# Patient Record
Sex: Male | Born: 1935 | Race: White | Hispanic: No | Marital: Married | Smoking: Former smoker
Health system: Southern US, Community
[De-identification: ages and names within clinical notes are randomized; demographics above are authoritative.]

## PROBLEM LIST (undated history)

## (undated) DIAGNOSIS — N183 Chronic kidney disease, stage 3 unspecified: Secondary | ICD-10-CM

## (undated) DIAGNOSIS — I714 Abdominal aortic aneurysm, without rupture: Principal | ICD-10-CM

## (undated) DIAGNOSIS — K56609 Unspecified intestinal obstruction, unspecified as to partial versus complete obstruction: Secondary | ICD-10-CM

## (undated) DIAGNOSIS — E7800 Pure hypercholesterolemia, unspecified: Secondary | ICD-10-CM

## (undated) DIAGNOSIS — I2510 Atherosclerotic heart disease of native coronary artery without angina pectoris: Secondary | ICD-10-CM

## (undated) DIAGNOSIS — I219 Acute myocardial infarction, unspecified: Secondary | ICD-10-CM

## (undated) DIAGNOSIS — I509 Heart failure, unspecified: Secondary | ICD-10-CM

## (undated) DIAGNOSIS — I10 Essential (primary) hypertension: Secondary | ICD-10-CM

## (undated) HISTORY — DX: Pure hypercholesterolemia, unspecified: E78.00

## (undated) HISTORY — DX: Essential (primary) hypertension: I10

## (undated) HISTORY — PX: HERNIA REPAIR: SHX51

## (undated) HISTORY — DX: Abdominal aortic aneurysm, without rupture: I71.4

## (undated) HISTORY — DX: Heart failure, unspecified: I50.9

## (undated) HISTORY — PX: NEPHRECTOMY: SHX65

## (undated) HISTORY — DX: Atherosclerotic heart disease of native coronary artery without angina pectoris: I25.10

## (undated) HISTORY — DX: Acute myocardial infarction, unspecified: I21.9

---

## 2001-02-22 ENCOUNTER — Ambulatory Visit (HOSPITAL_COMMUNITY): Admission: RE | Admit: 2001-02-22 | Discharge: 2001-02-22 | Payer: Self-pay | Admitting: Family Medicine

## 2001-02-22 ENCOUNTER — Encounter: Payer: Self-pay | Admitting: General Surgery

## 2001-02-22 ENCOUNTER — Emergency Department (HOSPITAL_COMMUNITY): Admission: EM | Admit: 2001-02-22 | Discharge: 2001-02-22 | Payer: Self-pay | Admitting: *Deleted

## 2001-02-22 ENCOUNTER — Encounter: Payer: Self-pay | Admitting: Family Medicine

## 2001-02-22 ENCOUNTER — Inpatient Hospital Stay (HOSPITAL_COMMUNITY): Admission: RE | Admit: 2001-02-22 | Discharge: 2001-02-26 | Payer: Self-pay | Admitting: General Surgery

## 2001-02-22 DIAGNOSIS — K56609 Unspecified intestinal obstruction, unspecified as to partial versus complete obstruction: Secondary | ICD-10-CM

## 2001-02-22 HISTORY — DX: Unspecified intestinal obstruction, unspecified as to partial versus complete obstruction: K56.609

## 2001-02-23 ENCOUNTER — Encounter: Payer: Self-pay | Admitting: General Surgery

## 2001-02-24 ENCOUNTER — Encounter: Payer: Self-pay | Admitting: General Surgery

## 2001-04-12 ENCOUNTER — Encounter: Admission: RE | Admit: 2001-04-12 | Discharge: 2001-04-12 | Payer: Self-pay | Admitting: *Deleted

## 2001-04-12 ENCOUNTER — Encounter: Payer: Self-pay | Admitting: *Deleted

## 2001-04-24 DIAGNOSIS — I714 Abdominal aortic aneurysm, without rupture, unspecified: Secondary | ICD-10-CM

## 2001-04-24 DIAGNOSIS — I2510 Atherosclerotic heart disease of native coronary artery without angina pectoris: Secondary | ICD-10-CM

## 2001-04-24 HISTORY — DX: Abdominal aortic aneurysm, without rupture: I71.4

## 2001-04-24 HISTORY — DX: Abdominal aortic aneurysm, without rupture, unspecified: I71.40

## 2001-04-24 HISTORY — DX: Atherosclerotic heart disease of native coronary artery without angina pectoris: I25.10

## 2001-05-27 ENCOUNTER — Ambulatory Visit (HOSPITAL_COMMUNITY): Admission: RE | Admit: 2001-05-27 | Discharge: 2001-05-27 | Payer: Self-pay | Admitting: *Deleted

## 2001-05-27 ENCOUNTER — Encounter: Payer: Self-pay | Admitting: *Deleted

## 2001-06-04 ENCOUNTER — Ambulatory Visit (HOSPITAL_COMMUNITY): Admission: RE | Admit: 2001-06-04 | Discharge: 2001-06-04 | Payer: Self-pay | Admitting: *Deleted

## 2001-06-13 ENCOUNTER — Ambulatory Visit (HOSPITAL_COMMUNITY): Admission: RE | Admit: 2001-06-13 | Discharge: 2001-06-13 | Payer: Self-pay | Admitting: Cardiovascular Disease

## 2001-06-21 ENCOUNTER — Encounter: Payer: Self-pay | Admitting: Cardiothoracic Surgery

## 2001-06-22 HISTORY — PX: CORONARY ARTERY BYPASS GRAFT: SHX141

## 2001-06-25 ENCOUNTER — Inpatient Hospital Stay (HOSPITAL_COMMUNITY): Admission: RE | Admit: 2001-06-25 | Discharge: 2001-07-02 | Payer: Self-pay | Admitting: Cardiothoracic Surgery

## 2001-06-25 ENCOUNTER — Encounter: Payer: Self-pay | Admitting: Cardiothoracic Surgery

## 2001-06-26 ENCOUNTER — Encounter: Payer: Self-pay | Admitting: Cardiothoracic Surgery

## 2001-06-28 ENCOUNTER — Encounter: Payer: Self-pay | Admitting: Cardiothoracic Surgery

## 2001-06-29 ENCOUNTER — Encounter: Payer: Self-pay | Admitting: Cardiothoracic Surgery

## 2001-06-30 ENCOUNTER — Encounter: Payer: Self-pay | Admitting: Cardiothoracic Surgery

## 2001-07-01 ENCOUNTER — Encounter: Payer: Self-pay | Admitting: Cardiothoracic Surgery

## 2001-09-22 HISTORY — PX: ABDOMINAL AORTIC ANEURYSM REPAIR: SUR1152

## 2001-10-14 ENCOUNTER — Encounter: Payer: Self-pay | Admitting: *Deleted

## 2001-10-16 ENCOUNTER — Encounter: Payer: Self-pay | Admitting: *Deleted

## 2001-10-16 ENCOUNTER — Inpatient Hospital Stay (HOSPITAL_COMMUNITY): Admission: RE | Admit: 2001-10-16 | Discharge: 2001-10-26 | Payer: Self-pay | Admitting: *Deleted

## 2001-10-17 ENCOUNTER — Encounter: Payer: Self-pay | Admitting: *Deleted

## 2001-10-21 ENCOUNTER — Encounter: Payer: Self-pay | Admitting: *Deleted

## 2001-10-22 ENCOUNTER — Encounter: Payer: Self-pay | Admitting: *Deleted

## 2001-10-23 ENCOUNTER — Encounter: Payer: Self-pay | Admitting: *Deleted

## 2010-02-17 ENCOUNTER — Ambulatory Visit: Payer: Self-pay | Admitting: Cardiovascular Disease

## 2010-07-14 ENCOUNTER — Other Ambulatory Visit: Payer: Self-pay | Admitting: *Deleted

## 2010-07-14 DIAGNOSIS — I509 Heart failure, unspecified: Secondary | ICD-10-CM

## 2010-07-14 MED ORDER — LISINOPRIL 20 MG PO TABS: 20.0000 mg | ORAL_TABLET | Freq: Two times a day (BID) | ORAL | Status: DC

## 2010-07-14 NOTE — Telephone Encounter (Signed)
REFILL PER FAX FROM PHARMACY  

## 2010-08-12 ENCOUNTER — Other Ambulatory Visit: Payer: Self-pay | Admitting: *Deleted

## 2010-08-12 ENCOUNTER — Encounter: Payer: Self-pay | Admitting: Cardiovascular Disease

## 2010-08-12 DIAGNOSIS — E7800 Pure hypercholesterolemia, unspecified: Secondary | ICD-10-CM

## 2010-08-16 ENCOUNTER — Encounter: Payer: Self-pay | Admitting: Cardiovascular Disease

## 2010-08-16 ENCOUNTER — Other Ambulatory Visit (INDEPENDENT_AMBULATORY_CARE_PROVIDER_SITE_OTHER): Payer: PRIVATE HEALTH INSURANCE | Admitting: *Deleted

## 2010-08-16 ENCOUNTER — Ambulatory Visit (INDEPENDENT_AMBULATORY_CARE_PROVIDER_SITE_OTHER): Payer: PRIVATE HEALTH INSURANCE | Admitting: Cardiovascular Disease

## 2010-08-16 ENCOUNTER — Other Ambulatory Visit (INDEPENDENT_AMBULATORY_CARE_PROVIDER_SITE_OTHER): Payer: PRIVATE HEALTH INSURANCE | Admitting: Cardiovascular Disease

## 2010-08-16 VITALS — BP 158/88 | HR 68 | Ht 68.0 in | Wt 177.2 lb

## 2010-08-16 DIAGNOSIS — Z13220 Encounter for screening for lipoid disorders: Secondary | ICD-10-CM

## 2010-08-16 DIAGNOSIS — E785 Hyperlipidemia, unspecified: Secondary | ICD-10-CM

## 2010-08-16 DIAGNOSIS — E7800 Pure hypercholesterolemia, unspecified: Secondary | ICD-10-CM

## 2010-08-16 DIAGNOSIS — I2510 Atherosclerotic heart disease of native coronary artery without angina pectoris: Secondary | ICD-10-CM

## 2010-08-16 DIAGNOSIS — Z951 Presence of aortocoronary bypass graft: Secondary | ICD-10-CM | POA: Insufficient documentation

## 2010-08-16 LAB — LIPID PANEL
HDL: 36.8 mg/dL — ABNORMAL LOW (ref >39.00)
Triglycerides: 252 mg/dL — ABNORMAL HIGH (ref 0.0–149.0)
VLDL: 50.4 mg/dL — ABNORMAL HIGH (ref 0.0–40.0)

## 2010-08-16 LAB — LDL CHOLESTEROL, DIRECT: Direct LDL: 130.5 mg/dL

## 2010-08-16 LAB — BASIC METABOLIC PANEL
Calcium: 9.9 mg/dL (ref 8.4–10.5)
Creatinine, Ser: 1.5 mg/dL (ref 0.4–1.5)
GFR: 47.11 mL/min — ABNORMAL LOW (ref >60.00)
Sodium: 140 mEq/L (ref 135–145)

## 2010-08-16 LAB — HEPATIC FUNCTION PANEL
Albumin: 4.2 g/dL (ref 3.5–5.2)
Alkaline Phosphatase: 61 U/L (ref 39–117)
Bilirubin, Direct: 0.1 mg/dL (ref 0.0–0.3)
Total Protein: 7.7 g/dL (ref 6.0–8.3)

## 2010-08-16 NOTE — Assessment & Plan Note (Signed)
He is very stable from a coronary artery standpoint. He's not having any episodes of angina. His bypass grafting was 9 years ago. I like to continue with his same medications. I've given him some samples of Crestor 5 mg to try. He has been intolerant to other statins in the past.

## 2010-08-16 NOTE — Assessment & Plan Note (Signed)
We've drawn a fasting lipid profile today. I've given him some Crestor 5 mg tablets to try. He'll let me know if he is able to take them. I've strongly encouraged him to try to find a statin at we'll lower his cholesterol so that we can reduce his risk of having further coronary artery disease.

## 2010-08-16 NOTE — Progress Notes (Signed)
Elijah Saunders Date of Birth  12/28/1933 [facility] / [facility] [address]     [address] [place], [place]  [address] [phone]  Fax  [phone]  History of Present Illness:  Follow up for CAD// CABG.  No chest pain or dyspnea.  Elijah Saunders has done quite well. He's not had any episodes of chest pain or shortness of breath. He's been working out in his garden without any difficulties. He has had some vertigo recently but has not had any episodes of syncope.  He has tried several statin medications but has not been successful in finding one that works.  His last LDL readings have been a little bit high.  He complains of some diarrhea following some of his medications. He thinks that it might be beneficial capsule.   Current Outpatient Prescriptions on File Prior to Visit  Medication Sig Dispense Refill  . aspirin 325 MG EC tablet Take 325 mg by mouth daily.        . cyanocobalamin 500 MCG tablet Take 500 mcg by mouth daily.        . fish oil-omega-3 fatty acids 1000 MG capsule Take 2 g by mouth 2 (two) times daily.       Marland Kitchen glucosamine-chondroitin 500-400 MG tablet Take 1 tablet by mouth 2 (two) times daily.        Marland Kitchen lisinopril (PRINIVIL,ZESTRIL) 20 MG tablet Take 1 tablet (20 mg total) by mouth 2 (two) times daily.  60 tablet  11  . metFORMIN (GLUCOPHAGE) 500 MG tablet Take 500 mg by mouth daily with breakfast.       . Vitamin D, Ergocalciferol, (DRISDOL) 50000 UNITS CAPS Take 50,000 Units by mouth every 7 (seven) days.        Marland Kitchen CINNAMON PO Take by mouth daily.          Allergies  Allergen Reactions  . Lopid (Gemfibrozil)   . Penicillins     Past Medical History  Diagnosis Date  . CAD (coronary artery disease)     s/p CABG  . MI (myocardial infarction)   . CHF (congestive heart failure)   . Renal insufficiency   . Hypercholesteremia   . HTN (hypertension)     Past Surgical History  Procedure Date  . Nephrectomy     History  Smoking status   . Former Smoker  . Quit date: 04/25/1995  Smokeless tobacco  . Not on file    History  Alcohol Use: Not on file    No family history on file.  Reviw of Systems:  Reviewed in the HPI.  All other systems are negative.  Physical Exam: BP 158/88  Pulse 68  Ht 5\' 8"  (1.727 m)  Wt 177 lb 3.2 oz (80.377 kg)  BMI 26.94 kg/m2 The patient is alert and oriented x 3.  The mood and affect are normal.  The skin is warm and dry.  Color is normal.  The HEENT exam reveals that the sclera are nonicteric.  The mucous membranes are moist.  The carotids are 2+ without bruits.  There is no thyromegaly.  There is no JVD.  The lungs are clear.  The chest wall is non tender.  The heart exam reveals a regular rate with a normal S1 and S2.  There are no murmurs, gallops, or rubs.  The PMI is not displaced.   Abdominal exam reveals good bowel sounds.  There is no guarding or rebound.  There is no hepatosplenomegaly or tenderness.  There are no masses.  Exam of the legs reveal no clubbing, cyanosis, or edema.  The legs are without rashes.  The distal pulses are intact.  Cranial nerves II - XII are intact.  Motor and sensory functions are intact.  The gait is normal.  Assessment / Plan:

## 2010-08-18 ENCOUNTER — Telehealth: Payer: Self-pay | Admitting: *Deleted

## 2010-08-18 NOTE — Telephone Encounter (Signed)
Patient called with lab results. Pt verbalized understanding. Jodette Elvis Boot RN  

## 2010-08-18 NOTE — Telephone Encounter (Signed)
Message copied by Mahalia Longest on Thu Aug 18, 2010  4:53 PM ------      Message from: [place], [place]      Created: Thu Aug 18, 2010  1:05 PM       Trigs are high. Continue meds

## 2010-09-09 NOTE — Procedures (Signed)
[place]. [facility]  Patient:    Elijah Saunders, Elijah Saunders Visit Number: [id number] MRN: [id number]          Service Type: DSU Location: [facility] 2856 01 Attending Physician:  Caralee Ates Dictated by:   Caralee Ates, M.D. Proc. Date: 06/04/01 Admit Date:  06/04/2001 Discharge Date: 06/04/2001   CC:         CVTS Office             Peripheral Vascular Laboratory                           Procedure Report  PREOPERATIVE DIAGNOSIS:  Abdominal aortic aneurysm.  POSTOPERATIVE DIAGNOSIS:  Abdominal aortic aneurysm.  PROCEDURE:  Abdominal aortogram.  ANESTHESIA:  Fentanyl 25 mcg, Versed 0.5 mcg, and 1% lidocaine as local.  ACCESS:  Right common femoral (5-French sheath).  TOTAL CONTRAST:  125 cc of Isopaque.  BRIEF HISTORY:  This is a 77 year old gentleman who presented initially with a small bowel obstruction treated by the general surgeons and on CT scan during its evaluation was found to have a 6-cm infrarenal abdominal aortic aneurysm. The patient had no previous knowledge of the aneurysms existence.  He had had no symptoms related to it.  Subsequently, following his discharge from the hospital, he followed up with me for its evaluation and definitive repair.  He underwent arteriogram today to determine if he was in fact a candidate for an endovascular stent graft.  DESCRIPTION OF PROCEDURE:  The patient was brought to the catheterization lab and placed in the catheterization lab in supine position.  Following IV sedation, the groins were draped and prepped in sterile fashion.  The right femoral head was identified under fluoroscopy and the overlying skin was anesthetized with 1% lidocaine.  A small nick incision was made.  A blunt track was dissected down to the level of the common femoral artery.  The common femoral artery was then percutaneously punctured and a 5-French sheath was placed over an 035 guidewire.  Next, a graduated pigtail catheter  was advanced on the guidewire up into the suprarenal aorta.  Serial contrast injections were made in the AP and lateral positions until satisfactory images of the aneurysms anatomy had been obtained.  Once all required images had been obtained, the catheter was draped over a guidewire and removed.  The patient was transferred to the recovery area, where the groin sheath was removed and direct pressure was held.  The patient tolerated the procedure well.  There were no complications.  ANGIOGRAM FINDINGS:  Infrarenal abdominal aortic aneurysm that appears to originate approximately 1.5 cm below the renal arteries.  The aneurysm extends to the bifurcation; however, it returns to a fairly normal taper at the bifurcation.  There does not appear to be aneurysm involvement of the iliac arteries bilaterally.  There is moderate proximal left common iliac artery stenosis present.  This does not appear to be hemodynamically significant. Both epigastric arteries were widely patent and filled normal collateral branches into the pelvis.  On the bottom of the oblique groin to the pelvis, the common femoral arteries were both widely patent with no evidence of disease.  RECOMMENDATIONS: 1. Routine post catheterization care. 2. I do not believe he is a candidate for an endovascular stent graft repair    due to the size of the proximal aortic neck, as it appears to be somewhat    dilated.  His options are to undergo  an open aortic aneurysm repair versus    being enrolled in a research protocol with one of the endovascular devices    with a larger caliber proximal neck, such as the Medtronic Talent device.    I plan to discuss his options with him in detail and make a plan from    there. Dictated by:   Caralee Ates, M.D. Attending Physician:  Caralee Ates. DD:  06/06/01 TD:  06/06/01 Job: [id number]

## 2010-09-09 NOTE — Cardiovascular Report (Signed)
[place]. [facility]  Patient:    Elijah Saunders, Elijah Saunders Visit Number: [id number] MRN: [id number]          Service Type: DSU Location: [facility] 2856 01 Attending Physician:  Caralee Ates Dictated by:   [name], M.D. Proc. Date: 06/13/01 Admit Date:  06/04/2001 Discharge Date: 06/04/2001   CC:         Maricela Bo, M.D.  Caralee Ates, M.D.   Cardiac Catheterization  INDICATIONS: The patient is a 77 year old gentleman with a history of an old myocardial infarction. He was recently found to have a large abdominal aortic aneurysm and was sent to our office for preoperative evaluation prior to his aneurysm surgery. A Cardiolite study revealed the presence of an old inferior wall myocardial infarction as well as a markedly reduced left ventricular systolic function with an EF of 28%. The patient presented for followup and discussion of this. His wife brought pictures of his previous heart catheterization about 10 years ago. It was found at that time that he actually had an old anterior wall myocardial infarction with collateral filling of the LAD via the right coronary artery. He was scheduled for a heart catheterization based on the new results that he most likely had an inferior wall myocardial infarction as well.  His baseline laboratory prior to the heart catheterization revealed a creatinine of 1.8. His glucose is 122. His BUN is 28. His triglyceride level is 474, cholesterol is 172.  PROCEDURES: Left heart catheterization with coronary angiography.  The right femoral artery was easily cannulated using a modified Seldinger technique. We were able to pass through the aneurysm without any difficulty and the catheters were all exchanged over a long exchange wire.  HEMODYNAMICS: The left ventricular pressure is 132/11 with an aortic pressure of 134/72.  ANGIOGRAPHY: The left main coronary artery has moderate disease. It is very heavily  calcified. There are no discrete stenosis but there is a 30-40% stenosis in the distal aspect of the left main.  The left anterior descending artery has diffuse disease throughout. There are diffuse 30-40% stenosis in the proximal segment. The LAD gives off a relatively large first diagonal branch and then several small septal branches and is then occluded. The mid and distal LAD can be seen filling via faint collaterals from the left system and can be seen filling via the right system during the RCA injections.  The first diagonal vessel is a moderate to large vessel. There are minor luminal irregularities. The first diagonal vessel does have a moderate degree of calcification.  The left circumflex artery is a moderate to large vessel. There is a 50-60% stenosis at the origin. There are mild to moderate diffuse irregularities throughout the remainder of the left circumflex artery.  The right coronary artery is a relatively large vessel. It is occluded after giving off a large right ventricular marginal branch. The distal right coronary artery can been seen filling very faintly from the right injections but can be seen filling fairly well from collaterals from the left system. The conus branch provides a large collateral branch to the LAD, and the LAD can be seen filling very well from injections from the right coronary artery.  LEFT VENTRICULOGRAM: The left ventriculogram was performed in a 30 RAO position. It reveals a moderately dilated left ventricle with akinesis of the inferior base and of the mid anterior wall. The remainder of the mid and distal anterior wall seems to contract fairly normally, as  well as the apex. The aortic root appears to be mildly dilated. There was no aortic stenosis. The ejection fraction is approximately 35%, which is somewhat improved from the EF by Cardiolite scanning last week.  COMPLICATIONS: None.  CONCLUSIONS: 1. Significant coronary artery  disease involving the left anterior descending    artery and right coronary artery. He has moderate disease involving the    diagonal branch and left circumflex artery. 2. He has a moderately reduced left ventricular systolic function with    segmental wall motion abnormalities consistent with previous myocardial    infarctions. His mid and distal anterior wall seem to contract fairly    well and there is evidence of anterior and anteroapical viability by    Cardiolite scanning. There is a definite scar in the inferior base and    mid inferior wall. The left ventricle is moderately dilated. 3. Question of a moderately dilated aortic root. There is no evidence of    aortic stenosis. An aortogram was not performed because of his renal    dysfunction and the fact that he has only one kidney. We will need to    obtain an echocardiogram for further assessment of his aortic valve.  The patient will likely need to have coronary artery bypass grafting for revascularization prior to his abdominal aortic aneurysm surgery. We will discuss all of these issues with Dr. Elyn Peers and the cardiovascular surgeons.  He is at somewhat high risk for both of these surgeries given his tendency toward diabetes mellitus and renal dysfunction. Dictated by:   [name], M.D. Attending Physician:  Caralee Ates. DD:  06/13/01 TD:  06/13/01 Job: [id number]
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
## 2010-09-09 NOTE — H&P (Signed)
[place]. [facility]  Patient:    Elijah Saunders, Elijah Saunders Visit Number: [id number] MRN: [id number]          Service Type: SUR Location: [facility] 2550 10 Attending Physician:  Caralee Ates Dictated by:   Dominica Severin, P.A. Admit Date:  10/16/2001                           History and Physical  DATE OF BIRTH:  1933/06/12  CHIEF COMPLAINT:  Abdominal aortic aneurysm.  HISTORY OF PRESENT ILLNESS:  This is a 77 year old Caucasian male who was referred by Dr. Derrell Lolling for evaluation of an abdominal aortic aneurysm.  It was initially found on a routine workup for a GI problem back in December.  It was found to be 5 cm at the time and the patient was not symptomatic.  He has been seen in follow-up after cardiac workup.  At the time of his cardiac workup it was determined that he needed bypass surgery.  He underwent bypass surgery in March 2003 and now, after he has recovered from that, he is cleared for surgery to repair his abdominal aortic aneurysm.  The patient denies any abdominal pain, nausea, or vomiting.  He does have occasional constipation with some hemorrhoids.  Denies any hematochezia, back pain, hematemesis.  He has some claudication symptoms in his calves after approximately 10 minutes; it goes away with rest.  He denies any peripheral edema, dysuria, hematuria. He does have some heartburn symptoms.  He denies any shortness of breath or dyspnea on exertion.  PAST MEDICAL HISTORY: 1. Hypertension. 2. Nephrolithiasis. 3. Coronary artery disease status post myocardial infarction with an ejection    fraction of 28%. 4. Vertigo. 5. Hyperlipidemia. 6. Status post recent small bowel obstruction.  PAST SURGICAL HISTORY: 1. CABG x4 done in March 2003. 2. 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 3. Bladder stone extraction. 4. Appendectomy. 5. Left inguinal herniorrhaphy.  MEDICATIONS: 1. Aspirin 325 mg daily. 2. Lasix  40 mg daily. 3. Potassium chloride 20 mEq daily. 4. Atenolol 25 mg daily. 5. Lisinopril 10 mg daily.  ALLERGIES:  PENICILLIN, which causes a rash.  REVIEW OF SYSTEMS:  Please see history of present illness for significant positives.  Otherwise, the patient denies any diabetes.  He does have a history of kidney disease.  Denies any history of stroke or TIAs, amaurosis fugax.  He does have a history of cardiac disease.  FAMILY HISTORY:  The patients mother is deceased at 65.  The patients father is deceased at 76 from COPD.  They both had a history of coronary artery disease, diabetes, and hypertension.  SOCIAL HISTORY:  The patient is married for 43 years.  He has two children. He works in Hydrologist.  He denies any alcohol use.  He quit smoking five years ago.  He has a 44 pack-year history.  PHYSICAL EXAMINATION:  VITAL SIGNS:  Blood pressure 120/68, pulse 76, respirations 18.  GENERAL:  This is a 77 year old Caucasian male who is in no acute distress who is alert and oriented x3.  HEENT:  Head is normocephalic, atraumatic.  Eyes are PERRLA/EOMI without cataracts, glaucoma, or macular degeneration.  NECK:  Supple without JVD, bruits, or lymphadenopathy.  CHEST:  Symmetrical on inspiration.  LUNGS:  Without wheezes, rhonchi, rales, or lymphadenopathy.  HEART:  Regular rate and rhythm without murmurs, gallops, or rubs. Demonstrates a well-healed sternotomy.  ABDOMEN:  Soft,  nontender, with positive bowel sounds x4 quadrants without any masses or bruits.  He has a well-healed nephrectomy site on the left as well as a right incision from previous bladder surgery and well-healed left inguinal herniorrhaphy incision.  GENITOURINARY AND RECTAL:  Deferred.  EXTREMITIES:  Without clubbing, cyanosis, edema, or ulcerations.  Saphenous vein harvest site of the left lower extremity is well healed.  Temperature is warm.  PERIPHERAL PULSES:  He has 2+ peripheral pulses  bilaterally on his carotid, femoral, popliteal, and pedal pulses.  NEUROLOGIC:  There are no focal deficits.  He has a steady gait.  He has 2+ deep tendon reflexes and 3+ muscle strength bilaterally.  ASSESSMENT AND PLAN:  Abdominal aortic aneurysm.  He is to undergo revision and grafting on October 16, 2001 by Dr. Elyn Peers.  Dr. Elyn Peers has seen and evaluated this patient prior to this admission and has explained the risks and benefits of the procedure and the patient has agreed to continue. Dictated by:   Dominica Severin, P.A. Attending Physician:  Caralee Ates. DD:  10/14/01 TD:  10/15/01 Job: [id number]

## 2010-09-09 NOTE — Discharge Summary (Signed)
[name]. [facility]  Patient:    Elijah Saunders, SOLTYS Visit Number: [id number] MRN: [id number]          Service Type: SUR Location: 2000 2021 01 Attending Physician:  Waldo Laine Dictated by:   Coral Ceo, P.A.C. Admit Date:  06/25/2001 Discharge Date: 07/02/2001   CC:         CVTS Office  Maricela Bo, M.D.  [name], M.D.  Angelia Mould. Derrell Lolling, M.D.   Discharge Summary  ADMISSION DIAGNOSES: 1. Coronary artery disease. 2. 5.0 cm abdominal aortic aneurysm. 3. History of myocardial infarction. 4. History of recent partial small bowel obstruction. 5. Status post left nephrectomy. 6. Hypertension. 7. History of nephrolithiasis. 8. Postoperative tracheobronchitis.  PROCEDURE:  Coronary artery bypass grafting x4 (left internal mammary artery to the LAD, saphenuos vein graft to the distal right coronary artery, saphenuos vein graft to the circumflex coronary artery, saphenuos vein graft to the diagonal).  HISTORY OF PRESENT ILLNESS:  The patient is a 77 year old male who was recently hospitalized in December of 2002 and treated by Angelia Mould. Derrell Lolling, M.D. for partial small bowel obstruction which required NG suctioning.  During his hospital stay as part of his evaluation, a CT scan was performed of his abdomen which showed a 5.5 cm abdominal aortic aneurysm.  He did recover from his bowel obstruction and was discharged home.  Following his admission he was seen in the CVTS Office by Brayton Layman, M.D. and it was recommended that he proceed with repair of his abdominal aortic aneurysm.  In light of his history of previous myocardial infarction, it was recommended that he see Dr. [name] for preoperative clearance.  A Cardiolite stress test showed markedly reduced left ventricular systolic function with an ejection fraction of approximately 28%.  He underwent cardiac catheterization on June 13, 2001, and was found to have a 30  to 40% distal left main stenosis along with multivessel coronary artery disease.  It was recommended that at this time he proceed with surgical revascularization for his cardiac lesions prior to surgical repair of his abdominal aortic aneurysm.  He was seen in consultation by Dr. Tyrone Sage and was admitted on June 25, 2001.  HOSPITAL COURSE:  He was taken to the operating room on the day of admission and underwent CABG x4.  He tolerated the procedure well and was transferred to the ICU in stable condition.  He was extubated shortly after surgery. He was hemodynamically stable and ready for transfer to the floor on postoperative day #1.  Postoperatively, he has done well.  He did develop a cough with purulent sputum and mild low grade fever.  He was started on Cipro empirically for tracheobronchitis.  Since that time he has done well.  He has been weaned off supplemental oxygen and at present, is maintaining O2 saturations of 92 to 94% on room air.  His incisions are healing well.  He has remained afebrile and vital signs have been stable.  His incisions are healing well.  His pulmonary status is improved at present.  He is ambulating in the halls without difficulty and is tolerating a regular diet.  He is having normal bowel and bladder function.  It is felt that if he continues to remain stable, he will be ready for discharge home on July 02, 2001.  DISCHARGE MEDICATIONS: 1. Enteric-coated aspirin 325 mg q.d. 2. Folic acid 1 mg q.d. 3. Reserpine 0.1 mg 1/2 tablet q.d. 4. Cipro 500 mg q.8h. x7  days. 5. Gemfibrozil 600 mg b.i.d. 6. Lasix 40 mg q.d. 7. K-Dur 20 mEq q.d. 8. Humibid LA 600 mg b.i.d. x7 days. 9. Darvocet-N 100 one to two q.4h. p.r.n. for pain.  DISCHARGE INSTRUCTIONS:  He is to refrain from driving, heavy lifting, or strenuous activity.  He should continue daily walking and incentive spirometry exercises.  He is asked to continue low fat, low sodium diet.  He may  shower daily and clean his incisions with soap and water.  FOLLOW-UP:  He will see Dr. [name] in two weeks and have a chest x-ray performed at that time.  He has been asked to follow up with Dr. Tyrone Sage in three weeks and should bring his chest x-ray to this appointment. Dictated by:   Coral Ceo, P.A.C. Attending Physician:  Waldo Laine DD:  07/01/01 TD:  07/03/01 Job: [id number]

## 2010-09-09 NOTE — Op Note (Signed)
[place]. [facility]  Patient:    Elijah Saunders, Elijah Saunders Visit Number: [id number] MRN: [id number]          Service Type: SUR Location: 3300 3304 01 Attending Physician:  Caralee Ates Dictated by:   Caralee Ates, M.D. Proc. Date: 10/16/01 Admit Date:  10/16/2001                             Operative Report  PREOPERATIVE DIAGNOSIS:  Infrarenal abdominal aortic aneurysm.  POSTOPERATIVE DIAGNOSIS:  Infrarenal abdominal aortic aneurysm.  OPERATION PERFORMED:  Abdominal aortic aneurysm repair (#18 Dacron tube graft).  SURGEON:  Caralee Ates, M.D.  ASSISTANT: 1. Larina Earthly, M.D. 2. Claudette Krell, RNFA  ANESTHESIA:  General endotracheal.  ESTIMATED BLOOD LOSS:  1300 cc.  DRAINS:  None.  SPECIMENS:  None.  COMPLICATIONS:  None.  INDICATIONS FOR PROCEDURE:  The patient is a 77 year old white gentleman who was discovered to have a 6 cm asymptomatic infrarenal abdominal aortic aneurysm six months ago and was being worked up for that and during his course of cardiac risk stratification was found to have severe coronary disease which required coronary artery bypass grafting in March by Dr. Tyrone Sage.  Since that time he has done quite well and today is felt to be a good candidate for open aneurysm repair.  He was not a candidate for an intravascular stent graft due to the large diameter of his infrarenal aortic neck.  DESCRIPTION OF PROCEDURE:  The patient was brought to the operating room and placed on the operating table in supine position.  Following adequate general endotracheal anesthesia, the chest, abdomen and upper thighs were prepped and draped in sterile fashion.  A vertical midline incision was made.  The wound was deepened  using a Bovie to control bleeding down to the level of the fascia.  The fascia was opened in the midline.  There were dense adhesions to the anterior abdominal wall from two previous abdominal operations and a great deal  of meticulous dissection was required to safely remove these from the anterior abdominal fascia.  This was done in sharp fashion and no bowel injury was incurred.  Once these adhesions had been freed, the fascia was opened for the length of the wound and the peritoneal cavity was entered.  There were dense adhesions from the small bowel to the transverse colon and these were taken down sharply.  Once all the adhesions had been lysed, the abdominal cavity was inspected for abnormality and there was no evidence of neoplastic or inflammatory disease.  The colon was then then reflected cephalad and the small bowel eviscerated to the right.  The retroperitoneum was then opened over the aneurysm up to the level of the renal vein.  The aortic neck was then dissected free on both sides and slotted in preparation for placement of cross clamp.  Next, the retroperitoneum was opened inferiorly beyond the bifurcation of the aorta and the iliac arteries on the other side were exposed.  Each of these was mobilized and encircled with vessel loops for control.  There was dense calcific plaque on the posteromedial aspect of both iliacs which were not felt to be amenable to suture.  Next, the patient was systemically heparinized.  Following adequate three-minute circulation time, the iliac clamps were placed followed by proximal aortic clamp.  The aneurysm was opened vertically staying clear of the origin of the inferior mesenteric artery which was  pulsatile.  The aneurysm was then td off cephalad.  It should be noted that he had a single right renal artery due to the fact that he has had a left nephrectomy in the past and a clamp was placed well below the right renal artery.  The aortotomy was then extended inferiorly to just above the bifurcation where it was td off there as well.  There was pulsatile back-bleeding from the IMA and this was controlled with a Glover clamp.  Next, a #18 Dacron tube graft was  selected and sewn into position proximally in end-to-end fashion with running 3-0 Prolene suture.  Following completion of the anastomosis, the clamp that was placed on the graft below the anastomosis and proximally over the graft, and clamp was released.  There was one area of bleeding and this was controlled with a single 3-0 Prolene repair stitch. Once the anastomotic suture line was hemostatic, the graft was extended its appropriate length and trimmed.  The distal anastomosis was created in end-to-end fashion to just above the bifurcation of the aorta with a running 3-0 Prolene suture.  Prior to completion of this anastomosis, the iliacs were back-bled and the graft was flushed.  The anastomosis was then completed.  The proximal clamp was released with pressure held on the groins restoring flow to the pelvis.  Blood pressure tolerated this well.  The leg perfusion was then re-established individually as blood pressure would allow.  Ultimately, flow to both legs was re-established without difficulty.  There was excellent Doppler flow in both feet and palpable femoral pulses bilaterally.  Again, the IMA was evaluated and found to have pulsatile back-bleeding and there was no evidence of colon ischemia with the IMA clamped during placement of the aortic graft.  Therefore, the IMA was oversewn from the inside with 2-0 silk suture ligature.  Next, 60 mg of protamine was administered and hemostasis achieved. The aneurysm sac was then reapproximated over the aortic graft and then the retroperitoneum was reapproximated with 2-0 Vicryl suture.  Again the colon was inspected for evidence of ischemia and there was none seen.  The colon and small bowel were then returned to their appropriate positions in the abdominal cavity and the abdomen was closed with a running #1 looped PDS suture as well as interrupted 0 Ethibond suture.  The wound was then irrigated and closed with skin staples.  Sterile dry  dressings were applied and the patient was then awakened from anesthesia and transferred to the recovery room in stable  condition.  The patient tolerated the procedure well.  There were no complications.  All sponge and needle counts were reported as correct. Dictated by:   Caralee Ates, M.D. Attending Physician:  Caralee Ates. DD:  10/16/01 TD:  10/17/01 Job: [id number]

## 2010-09-09 NOTE — Discharge Summary (Signed)
[place]. [facility]  Patient:    SIRAJ, DERMODY Visit Number: [id number] MRN: [id number]          Service Type: SUR Location: 2000 2011 01 Attending Physician:  Caralee Ates. Dictated by:   Sherrie George, P.A. Admit Date:  10/16/2001 Discharge Date: 10/26/2001   CC:         [name], M.D.   Discharge Summary  DATE OF BIRTH: 1934/03/06  ADMISSION DIAGNOSES: 1. A 6 cm abdominal aortic aneurysm. Status post recent small bowel    obstruction. 2. Hypertension. 3. Coronary artery disease, status post myocardial infarction with an    EF of 28%. 4. Renal insufficiency with baseline creatinine of 2.0. 5. Hyperlipidemia.  DISCHARGE DIAGNOSES: 1. A 6 cm abdominal aortic aneurysm. Status post recent small bowel    obstruction. 2. Hypertension. 3. Coronary artery disease, status post myocardial infarction with an    EF of 28%. 4. Renal insufficiency with baseline creatinine of 2.0. 5. Hyperlipidemia. 6. Postoperative ileus.  PROCEDURES: Resection and grafting of abdominal aortic aneurysm and repair with #18 mm tube graft on October 16, 2001 by Dr. Elyn Peers.  HISTORY OF PRESENT ILLNESS: The patient is a 77 year old white male patient of Dr. [name], Montez Hageman. The patient was recently admitted by Dr. Claud Kelp for a small bowel obstruction and was found to have a 6 cm infrarenal abdominal aortic aneurysm on CT scan at that time. This has been asymptomatic and he has had no problems with this. He has undergone cardiac stratification and was found to have significant coronary artery occlusive disease. He underwent CABG times four by Dr. Tyrone Sage on June 25, 2001. Since his discharge from the hospital, he has done quite well and now returns for elective repair of his abdominal aortic aneurysm. He was found not to be a candidate for the endovascular grafting and he is admitted for elective open resection grafting of his abdominal aortic  aneurysm.  PAST MEDICAL HISTORY: 1. Small bowel obstruction as noted above. 2. History of hypertension. 3. Coronary artery disease status post MI with EF of 28% and CABG times four    in March of 2003. 4. History of vertigo. 5. Hyperlipidemia. 6. Status post left nephrectomy for a Staghorn calculus 48 years ago. 7. Appendectomy. 8. Inguinal hernia repair. 9. Bladder stone extraction.  ADMISSION MEDICATIONS: 1. Ecotrin 325 mg one q.d. 2. Lasix 40 mg q.d. 3. KCL 20 meq daily. 4. Atenolol 25 mg q.d. 5. Lisinopril 20 mg one half tab q.d.  ALLERGIES: PENICILLIN (rash).  PHYSICAL EXAMINATION: Please see dictated note.  SOCIAL HISTORY: The patient smoked one pack per day for 44 years. Denies alcohol use.  HOSPITAL COURSE: The patient was admitted and taken to the OR and underwent repair of abdominal aortic aneurysm with #18 mm tube graft. He tolerated the procedure well and returned to the recovery room and then the ICU in satisfactory condition. He made good progress for the first 48 hours but developed some nausea and vomiting on October 20, 2001. He developed persistent vomiting on October 20, 2001. NG was inserted. IV fluids were elevated. KUB showed an ileus. The fifth postoperative day, he had over 1300 cc, either vomitus or NG drainage. He was kept on low suction and was hydrated appropriately. On October 22, 2001, the sixth postoperative day, he was still putting out large volume of NG drainage at 1650 cc for the 24 hours prior and over 325 cc in the first  eight hours. His potassium was low and this was replaced. By the seventh postoperative day, his NG was diminished. It continued to diminish through the eighth and his NG was initially clamped off and then it was removed. He was started on clear liquids and then advanced to full liquids later in the day. He was started on a soft diet on October 25, 2001. If he tolerates this well and continues to do well, will be able to discharge him  home in the morning, October 26, 2001. The patient came in with a baseline creatinine of 2.0. This was monitored closely throughout his hospital course. His creatinine remained stable and was between 1.8 and 2.0 over the four days prior to discharge. Potassium low and this has been replaced. He is mobilized. He was on hand held nebulizers. This was switched to Combivent and has now been discontinued. If he does well and has no further wheezing or other problems and if he is able to continue eating without difficulty, will aim at discharging him home in the morning, October 26, 2001. He had a small bowel movement yesterday and today, October 25, 2001. His wounds are all healing nicely. It is all midline incision. Will remove the staples in the morning prior to discharge.  DISCHARGE MEDICATIONS: Preadmission medications as before, as noted above. This includes the following. 1. Ecotrin 325 mg q.d. 2. Lasix 40 mg q.d. 3. K-Dur 20 meq q.d. 4. Atenolol 25 mg q.d. 5. Lisinopril 20 mg one half tablet q.d. 6. Tylox one to two p.o. q.4h. p.r.n.  FOLLOW-UP: He will return to see Dr. Elyn Peers in two weeks and he is to contact Dr. [name] for follow-up with his office also.  LABORATORY DATA: H&H 11.1 and 32. White count 15.7. Platelets 385,000. Electrolytes normal except for potassium of 3.3, BUN 21, creatinine 1.9, and glucose of 123.  DISCHARGE CONDITION: Improving. Dictated by:   Sherrie George, P.A. Attending Physician:  Caralee Ates. DD:  10/25/01 TD:  10/29/01 Job: [id number]

## 2010-09-09 NOTE — Op Note (Signed)
[place]. [facility]  Patient:    Elijah Saunders, Elijah Saunders Visit Number: [id number] MRN: [id number]          Service Type: SUR Location: 2000 2021 01 Attending Physician:  Waldo Laine Dictated by:   Gwenith Daily Tyrone Sage, M.D. Proc. Date: 06/25/01 Admit Date:  06/25/2001   CC:         Elijah Grove., M.D.   Operative Report  PREOPERATIVE DIAGNOSIS:  Coronary occlusive disease.  POSTOPERATIVE DIAGNOSIS:  Coronary occlusive disease.  PROCEDURE:  Coronary artery bypass grafting x4 with the left internal mammary to the left anterior descending coronary artery, reversed saphenous vein graft to the diagonal coronary artery, reversed saphenous vein graft to the first obtuse marginal coronary artery, reversed saphenous vein graft to the distal right coronary artery.  SURGEON:  Gwenith Daily. Tyrone Sage, M.D.  FIRST ASSISTANT:  Tim Lair.  BRIEF HISTORY:  The patient is a 77 year old male who presented in December 2002 with a partial small bowel obstruction.  At that time he was evaluated by Dr. Claud Kelp.  His small bowel symptoms resolved but during his hospitalization, evaluation of his abdomen included a CT scan, which demonstrated an abdominal aortic aneurysm close to 6 cm in size.  He was evaluated by Dr. Elyn Peers for consideration of aneurysm repair.  During this evaluation because of the patients previous history of a myocardial infarction 10 years previously, a cardiology evaluation was carried out preoperatively, including echocardiogram and stress test by Dr. [name]. This led to cardiac catheterization, which demonstrated significant three-vessel coronary artery disease with total occlusion of the LAD, a large diagonal with a 60% stenosis, a circumflex system with disease feeding a large obtuse marginal, and total occlusion of the right coronary artery after the takeoff of the acute marginal and with decreased LV function with an  ejection fraction approximately 30%.  Coronary artery bypass grafting was recommended. The patient agreed and signed the informed consent.  DESCRIPTION OF PROCEDURE:  With Swan-Ganz and arterial line monitors in place, the patient underwent general endotracheal anesthesia without incident.  Skin of the chest and legs was prepped with Betadine and draped in the usual sterile manner.  Vein was harvested from the right lower extremity and was of good quality and caliber.  Median sternotomy was performed, and the left internal mammary artery was dissected down as a pedicle graft.  The distal artery was dilated and had good, free flow.  The pericardium was opened.  The patient had evidence of global hypokinesis.  He was systemically heparinized. The ascending aorta and the right atrium were cannulated and an aortic root vent cardioplegia needle was introduced into the ascending aorta.  The patient was placed on cardiopulmonary bypass 2.4 L/min. per sq. m.  Sites of anastomosis were selected and dissected out of the epicardium.  The patients LAD was partially intramyocardial but was located relatively easily.  His body temperature was cooled to 30 degrees, the aortic crossclamp was applied, and 500 cc of cold blood potassium cardioplegia was administered with rapid diastolic arrest of the heart.  Attention was turned to the first obtuse marginal, which was opened and admitted a 1.5 mm probe.  Using a running 7-0 Prolene, a distal anastomosis was performed.  Attention was then turned to the distal right coronary artery, where the vessel was thickened distally just past the takeoff of the posterior descending.  The right coronary artery was opened and admitted a 1.5 mm probe proximally and distally.  Using a running 7-0 Prolene, distal anastomosis was performed.  Attention was then turned to the diagonal coronary artery, which was a moderate-sized vessel and admitted a 1.5 mm probe.  Using a  running 7-0 Prolene, a segment of reversed saphenous vein graft was anastomosed to the diagonal coronary artery.  Attention was then turned to the left anterior descending coronary artery, which was a totally occluded vessel and had fill from collaterals from the right.  The vessel was opened and admitted a 1.5 mm probe proximally and distally.  Using a running 8-0 Prolene, the left internal mammary artery was anastomosed to the left anterior descending coronary artery.  With release of the Edwards bulldog on the mammary artery, there was appropriate rise in myocardial septal temperature.  Aortic crossclamp was removed.  Total crossclamp time was 54 minutes.  The patient spontaneously converted to a sinus rhythm.  A partial occlusion clamp was placed on the ascending aorta, and two punch aortotomies were performed and each of the two vein grafts anastomosed to the ascending aorta.  Air was evacuated from the grafts, and the partial occlusion clamp was removed.  Sites of anastomosis were inspected and were free of bleeding.  The patient was then ventilated and weaned from cardiopulmonary bypass without difficulty on low-dose dopamine.  He had been maintained on dopamine because of mild renal insufficiency and previously surgically removed left kidney. The patient was decannulated in the usual fashion.  Protamine sulfate was administered with the operative field hemostatic.  Two atrial and two ventricular pacing wires were applied, graft markers were applied.  A left pleural tube and two mediastinal tubes left in place.  The pericardium was loosely reapproximated, the sternum closed with #6 stainless steel wire, fascia closed with interrupted 0 Vicryl, running 3-0 Vicryl in the subcutaneous tissue and 4-0 subcuticular stitch in the skin edges.  Dry dressings were applied.  Sponge and needle count was reported as correct at the completion of the procedure.  The patient tolerated the procedure  without obvious complication and was transferred to the surgical intensive care unit for further postoperative care.  Dictated by:   Gwenith Daily Tyrone Sage, M.D. Attending Physician:  Waldo Laine DD:  06/28/01 TD:  06/30/01 Job: [id number]

## 2010-09-09 NOTE — Consult Note (Signed)
[place]  Patient:    Elijah Saunders, Elijah Saunders Visit Number: [id number] MRN: [id number]          Service Type: EMS Location: [facility] Attending Physician:  Carmelina Peal Dictated by:   Caralee Ates, M.D. Proc. Date: 02/22/01 Admit Date:  02/22/2001 Discharge Date: 02/22/2001                            Consultation Report  CONSULTING SERVICE:  Angelia Mould. Derrell Lolling, M.D.  REASON FOR CONSULTATION:  Abdominal aortic aneurysm.  HISTORY OF PRESENT ILLNESS:  This is a 77 year old white gentleman who was admitted through the emergency department by [facility]. Derrell Lolling, M.D. with a diagnosis of a partial small-bowel obstruction. According to Elijah Saunders, he has had abdominal pain with crampy distention for four days that worsened in the last 24 hours. He has had nausea, vomiting, multiple bouts of non-bloody diarrheal stools. Following his initial encounter in the emergency department, an abdominal ultrasound was obtained given the patients symptoms. They found a 5.2 cm abdominal aortic aneurysm. There is no evidence of periaortic fluid or leak. The patient states that his pain is primarily periumbilical and does not have radiation to his lower extremities or to his back. He has had multiple prior abdominal operations and has had a previous episode of partial small-bowel obstruction. He did not have prior knowledge of his abdominal aortic aneurysm.  ALLERGIES:  PENICILLIN ("swells up").  CURRENT MEDICATIONS:  Reserpine and aspirin.  PAST MEDICAL HISTORY:  Hypertension, ? diabetes mellitus, coronary artery disease status post myocardial infarction, history of partial small-bowel obstruction, and nephrolithiasis.  PAST SURGICAL HISTORY: 1. Left nephrectomy secondary to severe nephrolithiasis. 2. Open bladder stone extraction. 3. Left inguinal hernia.  SOCIAL HISTORY:  He denies ETOH use. However, has a long (40 years) history of cigarette smoking and quit eight  years ago.  FAMILY HISTORY:  Has history of stroke, hypertension, myocardial infarction.  REVIEW OF SYSTEMS:  NEUROLOGICAL:  He is intact. HEENT:  Negative. CARDIOVASCULAR:  History of silent MI, as well as hypertension. PULMONARY: Long history of cigarette smoking. However, there is no known acute or chronic pulmonary process. GI:  History of partial small-bowel obstruction. GU: History of nephrolithiasis having had to undergo left nephrectomy secondary to extensive stone formation. Also has had an open bladder stone extraction. RENAL:  Negative. PSYCHIATRIC:  Negative. MUSCULOSKELETAL:  Negative. ENDOCRINE:  ? borderline diabetic.  PHYSICAL EXAMINATION:  VITAL SIGNS:  His heart rate is 72, his blood pressure 158/86, his respirations are 21, his oxygen saturations are 91% on room air.  GENERAL:  He is awake, alert, and in no acute distress.  HEENT:  Negative.  CHEST:  Clear to auscultation bilaterally.  CARDIOVASCULAR:  Regular rate and rhythm without murmur, gallop, or rub.  ABDOMEN:  Significantly distended with very few bowel sounds. He had moderate epigastric tenderness on palpation. His aortic aneurysm could not be palpated due to his protuberant abdomen and distention.  EXTREMITIES:  Warm and dry.  VASCULAR:  He has 2+ palpable radial, brachial, femoral; and 1+ palpable dorsalis pedis pulses bilaterally.  LABORATORY DATA:  His EKG demonstrates normal sinus rhythm with no acute ischemic change.  IMPRESSION:  The patient is a 77 year old with partial small-bowel obstruction and an incidental finding of a 5.2 cm abdominal aortic aneurysm. His abdominal symptoms are not classical for a symptomatic abdominal aortic aneurysm, and there is no evidence of leak on ultrasound.  RECOMMENDATION: 1. Would check CT scan of abdomen and pelvis per stent graft protocol. This    will allow [place] to confirm there is no leak associated with this aneurysm and    also allow [place] to image  the aortic anatomy in anticipation of a possible    endovascular repair at some point in the future. 2. I will plan to follow up on the CT scan and make further recommendations    after that. Dictated by:   Caralee Ates, M.D. Attending Physician:  Carmelina Peal DD:  02/22/01 TD:  02/25/01 Job: [id number]

## 2010-09-09 NOTE — Discharge Summary (Signed)
[facility]  Patient:    Elijah Saunders, Elijah Saunders Visit Number: [id number] MRN: [id number]          Service Type: MED Location: [phone] Attending Physician:  Brandy Hale Dictated by:   Angelia Mould. Derrell Lolling, M.D. Admit Date:  02/22/2001 Discharge Date: 02/26/2001   CC:         Maricela Bo, M.D.  Caralee Ates, M.D.  Arturo Morton. Riley Kill, M.D. [facility]   Discharge Summary  FINAL DIAGNOSES: 1. Partial small-bowel obstruction, resolved, presumably secondary to    adhesions. 2. Abdominal aortic aneurysm, 5.5 cm diameter, asymptomatic. 3. Hypertension. 4. Coronary artery disease, documented "silent myocardial infarction" by    electrocardiogram.  OPERATIONS PERFORMED:  None.  CONSULTATIONS:  Caralee Ates, M.D.  HISTORY OF PRESENT ILLNESS:  This is a 77 year old white male with a history of vague abdominal pain which began on February 18, 2001.  On Wednesday, February 20, 2001, he had more severe periumbilical cramping pain and vomited numerous times and had three loose nonbloody stools.  The pain improved somewhat, but persisted.  He came to the [facility] Emergency Room as suggested by Dr. Kathrynn Running and I evaluated him there.  He has not had any prior similar episodes.  PAST MEDICAL HISTORY:  Significant for appendectomy at age 22, left nephrectomy for stone disease 1955, open suprapubic bladder stone extraction 1955, left inguinal hernia repair.  He also has a history of borderline diabetes, hypertension, vertigo, and a "silent myocardial infarction", noted by EKG changes.  CURRENT MEDICATIONS: 1. Reserpine 1 tablet daily. 2. Enteric-coated aspirin 1 daily.  ALLERGIES:  PENICILLIN.  PHYSICAL EXAMINATION:  GENERAL:  Pleasant, over middle-aged gentleman, in minimal distress.  VITAL SIGNS:  Temperature 98.0, heart rate 81, respiratory rate 20, blood pressure 154/82.  LUNGS:  Clear to auscultation.  HEART:  Regular rate and rhythm, no  murmur.  ABDOMEN:  Distended and tympanitic.  Multiple scars.  No hernia or masses noted.  He had mild diffuse tenderness, but no peritoneal signs, guarding or rebound.  GENITALIA:  Normal penis, scrotum and testes.  No hernias there either.  ADMISSION DATA:  Abdominal x-rays suggest a partial small-bowel obstruction. Ultrasound sound showed a 5.2 by 4.9 cm abdominal aortic aneurysm, but no free fluid and the left kidney was absent.  White blood cell count 12,600, hemoglobin 16.1, BUN 30, creatinine 1.3.  HOSPITAL COURSE:  The patient was admitted.  IV fluids were started and a nasogastric tube was inserted.  A CT scan was done to evaluate the aneurysm. The CT scan with oral contrast showed a partial small-bowel obstruction, possibly high grade, but some of the contrast did go through into the colon. There were no inflammatory changes, no free fluid, no abscess, no mass and the abdominal aortic aneurysm was noted to be 5.5 cm with no evidence of leak or fluid.  I asked Dr. Brayton Layman to see the patient regarding his abdominal aortic aneurysm.  He agreed that he did not seem to be symptomatic from the aneurysm, but that close follow up will be required in the future.  Arrangements were made for him to follow up with Dr. Brayton Layman as an outpatient.  In the next couple of days, the patient began passing flatus and ultimately had a bowel movement and his abdominal distension resolved and the nasogastric tube was removed.  His abdominal x-rays showed resolution of the small-bowel obstruction with passage of the contrast completely into the colon, small-bowel distension resolved.  DISCHARGE INSTRUCTIONS:  The patient was discharged on February 26, 2001.  At that time he was asymptomatic, tolerating a regular diet, had had three bowel movement.  His abdomen was soft, nontender and nondistended.  I advised the patient that he probably had small-bowel obstruction which resolved, and  was probably due to adhesions.  If he has any further symptoms, he will need further evaluation and could possibly require a laparotomy in the future.  FOLLOWUP:  I asked him to follow up with me in the office in 10-14 days.  I also asked him to follow up with Dr. Brayton Layman as an outpatient regarding his aneurysm. Dictated by:   Angelia Mould. Derrell Lolling, M.D. Attending Physician:  Brandy Hale DD:  03/04/01 TD:  03/04/01 Job: [id number]

## 2010-10-03 ENCOUNTER — Telehealth: Payer: Self-pay | Admitting: Cardiovascular Disease

## 2010-10-03 DIAGNOSIS — E785 Hyperlipidemia, unspecified: Secondary | ICD-10-CM

## 2010-10-03 MED ORDER — ROSUVASTATIN CALCIUM 5 MG PO TABS: 5.0000 mg | ORAL_TABLET | Freq: Every day | ORAL | Status: DC

## 2010-10-03 NOTE — Telephone Encounter (Signed)
Called in because Dr. [name] gave him some samples of Crestor and told him to take one every other day to see if it affected him adversely.  After finishing the samples he says he feels fine and would like a prescription of Crestor called into [facility] [phone]. Please call back once the prescription is filled.

## 2010-10-03 NOTE — Telephone Encounter (Signed)
Patient request refill. Pt verbalized understanding. Alfonso Ramus RN

## 2010-12-29 ENCOUNTER — Telehealth: Payer: Self-pay | Admitting: Cardiovascular Disease

## 2010-12-29 NOTE — Telephone Encounter (Signed)
Pt wanted dr [name] to know he has quit taking crestor please call

## 2010-12-30 ENCOUNTER — Telehealth: Payer: Self-pay | Admitting: *Deleted

## 2010-12-30 ENCOUNTER — Other Ambulatory Visit: Payer: Self-pay | Admitting: *Deleted

## 2010-12-30 NOTE — Telephone Encounter (Signed)
Pt stopped crestor, tried to take every other day but experienced leg cramps/ muscle weakness. FYI Dr [name], Alfonso Ramus RN

## 2010-12-30 NOTE — Telephone Encounter (Signed)
Noted, check lipids with next visit.

## 2011-01-05 ENCOUNTER — Other Ambulatory Visit: Payer: PRIVATE HEALTH INSURANCE | Admitting: *Deleted

## 2011-01-16 ENCOUNTER — Other Ambulatory Visit: Payer: Self-pay | Admitting: Cardiovascular Disease

## 2011-01-16 ENCOUNTER — Other Ambulatory Visit (INDEPENDENT_AMBULATORY_CARE_PROVIDER_SITE_OTHER): Payer: PRIVATE HEALTH INSURANCE | Admitting: *Deleted

## 2011-01-16 ENCOUNTER — Other Ambulatory Visit: Payer: PRIVATE HEALTH INSURANCE | Admitting: *Deleted

## 2011-01-16 DIAGNOSIS — E785 Hyperlipidemia, unspecified: Secondary | ICD-10-CM

## 2011-01-16 LAB — HEPATIC FUNCTION PANEL
ALT: 18 U/L (ref 0–53)
AST: 22 U/L (ref 0–37)
Alkaline Phosphatase: 57 U/L (ref 39–117)
Total Bilirubin: 0.6 mg/dL (ref 0.3–1.2)

## 2011-01-16 LAB — BASIC METABOLIC PANEL
Calcium: 9.6 mg/dL (ref 8.4–10.5)
Creatinine, Ser: 1.6 mg/dL — ABNORMAL HIGH (ref 0.4–1.5)
GFR: 44.38 mL/min — ABNORMAL LOW (ref >60.00)
Glucose, Bld: 152 mg/dL — ABNORMAL HIGH (ref 70–99)
Sodium: 138 mEq/L (ref 135–145)

## 2011-01-16 LAB — LIPID PANEL
Cholesterol: 200 mg/dL (ref 0–200)
Triglycerides: 215 mg/dL — ABNORMAL HIGH (ref 0.0–149.0)

## 2011-02-13 ENCOUNTER — Ambulatory Visit (INDEPENDENT_AMBULATORY_CARE_PROVIDER_SITE_OTHER): Payer: PRIVATE HEALTH INSURANCE | Admitting: Cardiovascular Disease

## 2011-02-13 ENCOUNTER — Encounter: Payer: Self-pay | Admitting: Cardiovascular Disease

## 2011-02-13 DIAGNOSIS — I2510 Atherosclerotic heart disease of native coronary artery without angina pectoris: Secondary | ICD-10-CM

## 2011-02-13 DIAGNOSIS — I5022 Chronic systolic (congestive) heart failure: Secondary | ICD-10-CM | POA: Insufficient documentation

## 2011-02-13 DIAGNOSIS — Z8679 Personal history of other diseases of the circulatory system: Secondary | ICD-10-CM | POA: Insufficient documentation

## 2011-02-13 DIAGNOSIS — I714 Abdominal aortic aneurysm, without rupture: Secondary | ICD-10-CM

## 2011-02-13 DIAGNOSIS — E785 Hyperlipidemia, unspecified: Secondary | ICD-10-CM

## 2011-02-13 DIAGNOSIS — I509 Heart failure, unspecified: Secondary | ICD-10-CM

## 2011-02-13 MED ORDER — CARVEDILOL 3.125 MG PO TABS: 3.1250 mg | ORAL_TABLET | Freq: Two times a day (BID) | ORAL | Status: DC

## 2011-02-13 NOTE — Assessment & Plan Note (Signed)
He status post abdominal aortic aneurysm repair. He's not had any episodes of abdominal pain. We'll continue to keep a close eye on his  blood pressure.

## 2011-02-13 NOTE — Progress Notes (Signed)
[name] Date of Birth  05/19/1933 [facility] [address]    [address] [place], [place]  [address] [phone]  Fax  [phone]  History of Present Illness:    Current Outpatient Prescriptions on File Prior to Visit  Medication Sig Dispense Refill  . aspirin 325 MG EC tablet Take 325 mg by mouth daily.        . cyanocobalamin 500 MCG tablet Take 500 mcg by mouth daily.        Marland Kitchen lisinopril (PRINIVIL,ZESTRIL) 20 MG tablet Take 1 tablet (20 mg total) by mouth 2 (two) times daily.  60 tablet  11  . metFORMIN (GLUCOPHAGE) 500 MG tablet Take 500 mg by mouth daily with breakfast.       . Vitamin D, Ergocalciferol, (DRISDOL) 50000 UNITS CAPS Take 50,000 Units by mouth every 7 (seven) days.          Allergies  Allergen Reactions  . Atorvastatin     Intolerant to many statins  . Crestor (Rosuvastatin Calcium)     Muscle pain  . Lopid (Gemfibrozil)   . Penicillins     Past Medical History  Diagnosis Date  . CAD (coronary artery disease)     s/p CABG, 2003   . MI (myocardial infarction)   . CHF (congestive heart failure)     EF 40% by echo, 2006  . Renal insufficiency   . Hypercholesteremia   . HTN (hypertension)     Past Surgical History  Procedure Date  . Nephrectomy     History  Smoking status  . Former Smoker  . Quit date: 04/25/1995  Smokeless tobacco  . Not on file    History  Alcohol Use: Not on file    No family history on file.  Reviw of Systems:  Reviewed in the HPI.  All other systems are negative.  Physical Exam: BP 140/78  Pulse 69  Ht 5\' 8"  (1.727 m)  Wt 177 lb (80.287 kg)  BMI 26.91 kg/m2 The patient is alert and oriented x 3.  The mood and affect are normal.   Skin: warm and dry.  Color is normal.    HEENT:   Normal carotids. He has no JVD.  Lungs: Clear   Heart: Regular rate S1-S2.    Abdomen: Nontender. He has good bowel sounds. There is no hepatosplenomegaly.  Extremities:  No clubbing cyanosis or  edema.  Neuro:  Nonfocal.     ECG: Normal sinus rhythm with a first-degree AV block. He has T-wave inversions in the lateral leads with a Q. wave flattening in leads V4 through V6.  Assessment / Plan:

## 2011-02-13 NOTE — Assessment & Plan Note (Addendum)
He's doing very well from a cardiac standpoint.  He has nonspecific ST and T wave changes on his EKG.   It has been about 9 years since his  last stress test. We'll schedule him for a Lexiscan Myoview for further evaluation of his coronary artery disease.

## 2011-02-13 NOTE — Assessment & Plan Note (Signed)
He's already on ACE inhibitor. His heart rate is fairly slow but will try adding carvedilol 3.125 mg twice a day. We'll gradually titrate that up as tolerated.

## 2011-02-13 NOTE — Patient Instructions (Addendum)
Your physician wants you to follow-up in: 6 months You will receive a reminder letter in the mail two months in advance. If you don't receive a letter, please call our office to schedule the follow-up appointment.   Your physician recommends that you return for a FASTING lipid profile: 6 months   Your physician has requested that you have a lexiscan myoview. For further information please visit [email or web address] Please follow instruction sheet, as given.   Your physician has recommended you make the following change in your medication:   1) start coreg 3.125 mg one tablet twice daily, 12 hours apart

## 2011-02-13 NOTE — Assessment & Plan Note (Signed)
He's intolerant to statins.   we will continue to have him watch his diet and to exercise much as possible. We'll check his lipids again when I see him again in 6 months.

## 2011-02-22 ENCOUNTER — Encounter: Payer: Self-pay | Admitting: *Deleted

## 2011-02-23 ENCOUNTER — Ambulatory Visit (HOSPITAL_COMMUNITY): Payer: PRIVATE HEALTH INSURANCE | Attending: Internal Medicine | Admitting: Radiology

## 2011-02-23 DIAGNOSIS — R42 Dizziness and giddiness: Secondary | ICD-10-CM | POA: Insufficient documentation

## 2011-02-23 DIAGNOSIS — Z87891 Personal history of nicotine dependence: Secondary | ICD-10-CM | POA: Insufficient documentation

## 2011-02-23 DIAGNOSIS — E785 Hyperlipidemia, unspecified: Secondary | ICD-10-CM | POA: Insufficient documentation

## 2011-02-23 DIAGNOSIS — I25810 Atherosclerosis of coronary artery bypass graft(s) without angina pectoris: Secondary | ICD-10-CM

## 2011-02-23 DIAGNOSIS — I10 Essential (primary) hypertension: Secondary | ICD-10-CM | POA: Insufficient documentation

## 2011-02-23 DIAGNOSIS — R9431 Abnormal electrocardiogram [ECG] [EKG]: Secondary | ICD-10-CM | POA: Insufficient documentation

## 2011-02-23 DIAGNOSIS — I739 Peripheral vascular disease, unspecified: Secondary | ICD-10-CM | POA: Insufficient documentation

## 2011-02-23 DIAGNOSIS — E119 Type 2 diabetes mellitus without complications: Secondary | ICD-10-CM | POA: Insufficient documentation

## 2011-02-23 DIAGNOSIS — Z8249 Family history of ischemic heart disease and other diseases of the circulatory system: Secondary | ICD-10-CM | POA: Insufficient documentation

## 2011-02-23 DIAGNOSIS — Z951 Presence of aortocoronary bypass graft: Secondary | ICD-10-CM | POA: Insufficient documentation

## 2011-02-23 MED ORDER — TECHNETIUM TC 99M TETROFOSMIN IV KIT
11.0000 | PACK | Freq: Once | INTRAVENOUS | Status: AC | PRN
  Administered 2011-02-23: 11 via INTRAVENOUS

## 2011-02-23 MED ORDER — REGADENOSON 0.4 MG/5ML IV SOLN
0.4000 mg | Freq: Once | INTRAVENOUS | Status: AC
  Administered 2011-02-23: 0.4 mg via INTRAVENOUS

## 2011-02-23 MED ORDER — TECHNETIUM TC 99M TETROFOSMIN IV KIT
33.0000 | PACK | Freq: Once | INTRAVENOUS | Status: AC | PRN
  Administered 2011-02-23: 33 via INTRAVENOUS

## 2011-02-23 NOTE — Progress Notes (Signed)
[facility] SITE 3 NUCLEAR MED [address] [place] [address] [phone]  Cardiology Nuclear Med Study  Elijah Saunders is a 76 y.o. male [id number] 1934/01/05   Nuclear Med Background Indication for Stress Test:  Evaluation for Ischemia, Graft Patency and Abnormal EKG History:  '03 OZH:[id number] scar, EF=28%>CABG x 4/AAA repair,  EF=35%; '06 Echo:EF=40% Cardiac Risk Factors: Family History - CAD, History of Smoking, Hypertension, Lipids, NIDDM and PVD  Symptoms:  Dizziness   Nuclear Pre-Procedure Caffeine/Decaff Intake:  None NPO After: 7:00pm   Lungs:  Clear.  O2 Sat 98% on RA. IV 0.9% NS with Angio Cath:  20g  IV Site: R Antecubital  IV Started by:  Stanton Kidney, EMT-P  Chest Size (in):  42 Cup Size: n/a  Height: 5\' 8"  (1.727 m)  Weight:  174 lb (78.926 kg)  BMI:  Body mass index is 26.46 kg/(m^2). Tech Comments:  Coreg held this am, no medications taken today per patient.    Nuclear Med Study 1 or 2 day study: 1 day  Stress Test Type:  Eugenie Birks  Reading MD: Olga Millers, MD  Order Authorizing Provider:  [name], MD  Resting Radionuclide: Technetium 31m Tetrofosmin  Resting Radionuclide Dose: 11.0 mCi   Stress Radionuclide:  Technetium 68m Tetrofosmin  Stress Radionuclide Dose: 33.0 mCi           Stress Protocol Rest HR: 59 Stress HR: 88  Rest BP: 175/82 Stress BP: 155/91  Exercise Time (min): n/a METS: n/a   Predicted Max HR: 146 bpm % Max HR: 60.27 bpm Rate Pressure Product: 52841   Dose of Adenosine (mg):  n/a Dose of Lexiscan: 0.4 mg  Dose of Atropine (mg): n/a Dose of Dobutamine: n/a mcg/kg/min (at max HR)  Stress Test Technologist: Smiley Houseman, CMA-N  Nuclear Technologist:  Domenic Polite, CNMT     Rest Procedure:  Myocardial perfusion imaging was performed at rest 45 minutes following the intravenous administration of Technetium 55m Tetrofosmin.  Rest ECG: Nonspecific T-wave changes.  Stress Procedure:  The  patient received IV Lexiscan 0.4 mg over 15-seconds.  Technetium 27m Tetrofosmin injected at 30-seconds.  There were no significant ST-T wave changes with Lexiscan, occasional PVC's.  He did have a hypotensive response to infusion.  He did c/o neck tightness.  Quantitative spect images were obtained after a 45 minute delay. Stress ECG: No significant ST segment change suggestive of ischemia.  QPS Raw Data Images:  Acquisition technically good; LVE. Stress Images:  There is decreased uptake in the distal anteroseptal wall and apex. Rest Images:  There is decreased uptake in the distal anteroseptal wall and apex. Subtraction (SDS):  There is a fixed defect that is most consistent with a previous infarction. Transient Ischemic Dilatation (Normal <1.22):  1.01 Lung/Heart Ratio (Normal <0.45):  0.39  Quantitative Gated Spect Images QGS EDV:  124 ml QGS ESV:  75 ml QGS cine images:  Distal anteroseptal and apical akinesis. QGS EF: 40%  Impression Exercise Capacity:  Lexiscan with no exercise. BP Response:  Normal blood pressure response. Clinical Symptoms:  No chest pain. ECG Impression:  No significant ST segment change suggestive of ischemia. Comparison with Prior Nuclear Study: No images to compare  Overall Impression:  Abnormal stress nuclear study with a moderate size, fixed distal anteroseptal and apical defect consistent with previous infarct; no ischemia.  Olga Millers

## 2011-02-27 ENCOUNTER — Other Ambulatory Visit: Payer: Self-pay | Admitting: *Deleted

## 2011-02-27 ENCOUNTER — Telehealth: Payer: Self-pay | Admitting: Cardiovascular Disease

## 2011-02-27 MED ORDER — CARVEDILOL 6.25 MG PO TABS: 6.2500 mg | ORAL_TABLET | Freq: Two times a day (BID) | ORAL | Status: DC

## 2011-02-27 NOTE — Progress Notes (Signed)
No answer home phone, work phone answered and said he is retired and no longer works there.

## 2011-02-27 NOTE — Telephone Encounter (Signed)
lexiscan results given, pt understands to increase coreg, to take dbl in am/pm till runs out then start new med one pill am/pm, Pt verbalized understanding. Refill complete Alfonso Ramus RN

## 2011-02-27 NOTE — Telephone Encounter (Signed)
Pt rtn call, poss test results

## 2011-07-17 ENCOUNTER — Telehealth: Payer: Self-pay | Admitting: Cardiovascular Disease

## 2011-07-17 ENCOUNTER — Other Ambulatory Visit: Payer: Self-pay | Admitting: *Deleted

## 2011-07-17 MED ORDER — LISINOPRIL 20 MG PO TABS: 20.0000 mg | ORAL_TABLET | Freq: Two times a day (BID) | ORAL | Status: DC

## 2011-07-17 NOTE — Telephone Encounter (Signed)
Pt requesting refill for lisinopril @ [facility]

## 2011-07-17 NOTE — Telephone Encounter (Signed)
Fax Received. Refill Completed. Elijah Saunders (R.M.A)   

## 2011-08-08 ENCOUNTER — Encounter: Payer: Self-pay | Admitting: Cardiovascular Disease

## 2011-08-08 ENCOUNTER — Ambulatory Visit (INDEPENDENT_AMBULATORY_CARE_PROVIDER_SITE_OTHER): Payer: Medicare Other | Admitting: Cardiovascular Disease

## 2011-08-08 DIAGNOSIS — E119 Type 2 diabetes mellitus without complications: Secondary | ICD-10-CM

## 2011-08-08 DIAGNOSIS — I509 Heart failure, unspecified: Secondary | ICD-10-CM

## 2011-08-08 DIAGNOSIS — I2510 Atherosclerotic heart disease of native coronary artery without angina pectoris: Secondary | ICD-10-CM

## 2011-08-08 DIAGNOSIS — E785 Hyperlipidemia, unspecified: Secondary | ICD-10-CM

## 2011-08-08 LAB — LDL CHOLESTEROL, DIRECT: Direct LDL: 132.4 mg/dL

## 2011-08-08 LAB — LIPID PANEL: Triglycerides: 196 mg/dL — ABNORMAL HIGH (ref 0.0–149.0)

## 2011-08-08 LAB — BASIC METABOLIC PANEL
BUN: 35 mg/dL — ABNORMAL HIGH (ref 6–23)
Chloride: 107 mEq/L (ref 96–112)
Creatinine, Ser: 1.6 mg/dL — ABNORMAL HIGH (ref 0.4–1.5)

## 2011-08-08 LAB — HEPATIC FUNCTION PANEL
ALT: 21 U/L (ref 0–53)
AST: 25 U/L (ref 0–37)
Bilirubin, Direct: 0.1 mg/dL (ref 0.0–0.3)
Total Bilirubin: 0.7 mg/dL (ref 0.3–1.2)

## 2011-08-08 NOTE — Progress Notes (Signed)
Elijah Saunders Date of Birth  [birth year] [facility] [address]    [address] [place], [place]  [address] [phone]  Fax  [phone]   Problem list: 1. Coronary artery disease-status post old anterior wall myocardial infarction. Is also status post old inferior wall myocardial infarct. He status post CABG. 2. Congestive heart failure-EF equal to 40% 3. Renal insufficiency 4. Hyperlipidemia  History of Present Illness:  Elijah Saunders is doing well.  He has had some episodes of hypothension- He had some orthostasis.  He ate some salty foods and felt better.  He doesn't have presented much energy as he should. He's been quite active working in his church. 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  He is put in a rather large garden. He's not had any episodes of angina.  Current Outpatient Prescriptions on File Prior to Visit  Medication Sig Dispense Refill  . aspirin 325 MG EC tablet Take 325 mg by mouth daily.        . carvedilol (COREG) 6.25 MG tablet Take 1 tablet (6.25 mg total) by mouth 2 (two) times daily.  60 tablet  11  . cyanocobalamin 500 MCG tablet Take 500 mcg by mouth daily.        Marland Kitchen lisinopril (PRINIVIL,ZESTRIL) 20 MG tablet Take 1 tablet (20 mg total) by mouth 2 (two) times daily.  60 tablet  5  . metFORMIN (GLUCOPHAGE) 500 MG tablet Take 500 mg by mouth daily with breakfast.       . Misc Natural Products (OSTEO BI-FLEX JOINT SHIELD PO) Take by mouth.        . Vitamin D, Ergocalciferol, (DRISDOL) 50000 UNITS CAPS Take 50,000 Units by mouth every 7 (seven) days.        Marland Kitchen lisinopril (PRINIVIL,ZESTRIL) 20 MG tablet Take 1 tablet (20 mg total) by mouth 2 (two) times daily.  60 tablet  11    Allergies  Allergen Reactions  . Atorvastatin     Intolerant to many statins  . Crestor (Rosuvastatin Calcium)     Muscle pain  . Lopid (Gemfibrozil)   . Penicillins     Past Medical History  Diagnosis Date  . CAD (coronary artery disease)    s/p CABG, 2003   . MI (myocardial infarction)   . CHF (congestive heart failure)     EF 40% by echo, 2006  . Renal insufficiency   . Hypercholesteremia   . HTN (hypertension)   . AAA (abdominal aortic aneurysm)     Past Surgical History  Procedure Date  . Nephrectomy     History  Smoking status  . Former Smoker  . Quit date: 04/25/1995  Smokeless tobacco  . Not on file    History  Alcohol Use: Not on file    No family history on file.  Reviw of Systems:  Reviewed in the HPI.  All other systems are negative.  Physical Exam: BP 120/85  Pulse 53  Ht 5\' 8"  (1.727 m)  Wt 177 lb 6.4 oz (80.468 kg)  BMI 26.97 kg/m2 The patient is alert and oriented x 3.  The mood and affect are normal.   Skin: warm and dry.  Color is normal.    HEENT:   Normal carotids. He has no JVD.  Lungs: Clear   Heart: Regular rate S1-S2.    Abdomen: Nontender. He has good bowel sounds. There is no hepatosplenomegaly.  Extremities:  No clubbing cyanosis or edema.  Neuro:  Nonfocal.     ECG:  Assessment / Plan:

## 2011-08-08 NOTE — Patient Instructions (Signed)
Your physician wants you to follow-up in: 6 months You will receive a reminder letter in the mail two months in advance. If you don't receive a letter, please call our office to schedule the follow-up appointment.  Your physician recommends that you return for a FASTING lipid profile: today and in 6 months  

## 2011-08-08 NOTE — Assessment & Plan Note (Signed)
He doesn't have any significant symptoms consistent with congestive heart failure. We'll continue with his current medications.

## 2011-08-08 NOTE — Assessment & Plan Note (Signed)
Shon Hale is done well. He's not had any episodes of chest pain.

## 2011-08-09 ENCOUNTER — Other Ambulatory Visit: Payer: Self-pay

## 2011-08-09 DIAGNOSIS — E785 Hyperlipidemia, unspecified: Secondary | ICD-10-CM

## 2011-11-06 ENCOUNTER — Other Ambulatory Visit (INDEPENDENT_AMBULATORY_CARE_PROVIDER_SITE_OTHER): Payer: Medicare Other

## 2011-11-06 DIAGNOSIS — E785 Hyperlipidemia, unspecified: Secondary | ICD-10-CM

## 2011-11-06 LAB — HEPATIC FUNCTION PANEL
ALT: 24 U/L (ref 0–53)
AST: 23 U/L (ref 0–37)
Albumin: 4 g/dL (ref 3.5–5.2)
Total Protein: 7.4 g/dL (ref 6.0–8.3)

## 2011-11-06 LAB — BASIC METABOLIC PANEL
BUN: 32 mg/dL — ABNORMAL HIGH (ref 6–23)
CO2: 25 mEq/L (ref 19–32)
Chloride: 108 mEq/L (ref 96–112)
Creatinine, Ser: 1.7 mg/dL — ABNORMAL HIGH (ref 0.4–1.5)

## 2011-11-06 LAB — LIPID PANEL: Cholesterol: 225 mg/dL — ABNORMAL HIGH (ref 0–200)

## 2011-11-09 ENCOUNTER — Telehealth: Payer: Self-pay | Admitting: Cardiovascular Disease

## 2011-11-09 NOTE — Telephone Encounter (Signed)
Fu call  °Pt returning your call about test results °

## 2011-11-09 NOTE — Telephone Encounter (Signed)
Done/ see lab note

## 2012-01-19 ENCOUNTER — Other Ambulatory Visit: Payer: Self-pay | Admitting: Cardiovascular Disease

## 2012-01-19 ENCOUNTER — Other Ambulatory Visit: Payer: Self-pay

## 2012-01-19 MED ORDER — LISINOPRIL 20 MG PO TABS: 20.0000 mg | ORAL_TABLET | Freq: Two times a day (BID) | ORAL | Status: DC

## 2012-01-25 NOTE — Telephone Encounter (Signed)
Not a cardiac drug

## 2012-01-30 ENCOUNTER — Encounter: Payer: Self-pay | Admitting: Cardiovascular Disease

## 2012-01-30 ENCOUNTER — Ambulatory Visit (INDEPENDENT_AMBULATORY_CARE_PROVIDER_SITE_OTHER): Payer: Medicare Other | Admitting: Cardiovascular Disease

## 2012-01-30 VITALS — BP 138/74 | HR 58 | Ht 68.0 in | Wt 183.0 lb

## 2012-01-30 DIAGNOSIS — I2510 Atherosclerotic heart disease of native coronary artery without angina pectoris: Secondary | ICD-10-CM

## 2012-01-30 DIAGNOSIS — I714 Abdominal aortic aneurysm, without rupture, unspecified: Secondary | ICD-10-CM

## 2012-01-30 DIAGNOSIS — I509 Heart failure, unspecified: Secondary | ICD-10-CM

## 2012-01-30 NOTE — Assessment & Plan Note (Signed)
Elijah Saunders has done well. He's not had any episodes of angina. He has not been able to exercise because of his knee pain. I've encouraged him to try working out on a stationary bike.  I'll see him again in 6 months for followup office visit. We'll check fasting labs at that time.

## 2012-01-30 NOTE — Progress Notes (Signed)
Elijah Saunders Date of Birth  02-01-1934 [facility] [address]    [address] [place], [place]  [address] [phone]  Fax  [phone]   Problem list: 1. Coronary artery disease-status post old anterior wall myocardial infarction. Is also status post old inferior wall myocardial infarct. He status post CABG. 2. Congestive heart failure-EF equal to 40% 3. Renal insufficiency 4. Hyperlipidemia  History of Present Illness:  Elijah Saunders is doing well.  He has had some episodes of hypothension- He had some orthostasis.  He ate some salty foods and felt better.  He doesn't have presented much energy as he should. He's been quite active working in his church. 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  He is put in a rather large garden. He's not had any episodes of angina.  He has had some knee pain and has not been walking much  Current Outpatient Prescriptions on File Prior to Visit  Medication Sig Dispense Refill  . ACCU-CHEK AVIVA test strip       . aspirin 325 MG EC tablet Take 325 mg by mouth daily.        . carvedilol (COREG) 6.25 MG tablet Take 1 tablet (6.25 mg total) by mouth 2 (two) times daily.  60 tablet  11  . cyanocobalamin 500 MCG tablet Take 500 mcg by mouth daily.        Marland Kitchen lisinopril (PRINIVIL,ZESTRIL) 20 MG tablet Take 1 tablet (20 mg total) by mouth 2 (two) times daily.  60 tablet  11  . metFORMIN (GLUCOPHAGE) 500 MG tablet Take 500 mg by mouth daily with breakfast.       . Vitamin D, Ergocalciferol, (DRISDOL) 50000 UNITS CAPS Take 50,000 Units by mouth every 7 (seven) days.        Marland Kitchen DISCONTD: lisinopril (PRINIVIL,ZESTRIL) 20 MG tablet Take 1 tablet (20 mg total) by mouth 2 (two) times daily.  60 tablet  5    Allergies  Allergen Reactions  . Atorvastatin     Intolerant to many statins  . Crestor (Rosuvastatin Calcium)     Muscle pain  . Lopid (Gemfibrozil)   . Penicillins     Past Medical History  Diagnosis Date  . CAD  (coronary artery disease)     s/p CABG, 2003   . MI (myocardial infarction)   . CHF (congestive heart failure)     EF 40% by echo, 2006  . Renal insufficiency   . Hypercholesteremia   . HTN (hypertension)   . AAA (abdominal aortic aneurysm)     Past Surgical History  Procedure Date  . Nephrectomy     History  Smoking status  . Former Smoker -- 1.0 packs/day for 40 years  . Types: Cigarettes  . Quit date: 04/25/1995  Smokeless tobacco  . Not on file    History  Alcohol Use No    History reviewed. No pertinent family history.  Reviw of Systems:  Reviewed in the HPI.  All other systems are negative.  Physical Exam: BP 138/74  Pulse 58  Ht 5\' 8"  (1.727 m)  Wt 183 lb (83.008 kg)  BMI 27.82 kg/m2 The patient is alert and oriented x 3.  The mood and affect are normal.   Skin: warm and dry.  Color is normal.    HEENT:   Normal carotids. He has no JVD.  Lungs: Clear   Heart: Regular rate S1-S2.    Abdomen: Nontender. He has good bowel sounds.  There is no hepatosplenomegaly.  Extremities:  No clubbing cyanosis or edema.  Neuro:  Nonfocal.     ECG: Oct. 8, 2013 - sinus brady at 58, 1st degree AV block, NS ST / T wave abnormality.  Assessment / Plan:

## 2012-01-30 NOTE — Patient Instructions (Addendum)
Your physician wants you to follow-up in: 6 months with Dr. Prescott Parma will receive a reminder letter in the mail two months in advance. If you don't receive a letter, please call our office to schedule the follow-up appointment.  Your physician recommends that you return for lab work in: 6 months, just prior to next appointment.  You must be fasting for these labs.

## 2012-05-15 ENCOUNTER — Encounter (INDEPENDENT_AMBULATORY_CARE_PROVIDER_SITE_OTHER): Payer: Medicare Other | Admitting: Ophthalmology

## 2012-05-15 DIAGNOSIS — I10 Essential (primary) hypertension: Secondary | ICD-10-CM

## 2012-05-15 DIAGNOSIS — H43819 Vitreous degeneration, unspecified eye: Secondary | ICD-10-CM

## 2012-05-15 DIAGNOSIS — H33309 Unspecified retinal break, unspecified eye: Secondary | ICD-10-CM

## 2012-05-15 DIAGNOSIS — E11319 Type 2 diabetes mellitus with unspecified diabetic retinopathy without macular edema: Secondary | ICD-10-CM

## 2012-05-15 DIAGNOSIS — H35039 Hypertensive retinopathy, unspecified eye: Secondary | ICD-10-CM

## 2012-05-15 DIAGNOSIS — E1165 Type 2 diabetes mellitus with hyperglycemia: Secondary | ICD-10-CM

## 2012-05-15 DIAGNOSIS — H3530 Unspecified macular degeneration: Secondary | ICD-10-CM

## 2012-05-23 ENCOUNTER — Ambulatory Visit (INDEPENDENT_AMBULATORY_CARE_PROVIDER_SITE_OTHER): Payer: Medicare Other | Admitting: Ophthalmology

## 2012-05-23 DIAGNOSIS — H33309 Unspecified retinal break, unspecified eye: Secondary | ICD-10-CM

## 2012-07-25 ENCOUNTER — Other Ambulatory Visit: Payer: Self-pay | Admitting: *Deleted

## 2012-07-25 ENCOUNTER — Encounter: Payer: Self-pay | Admitting: Cardiovascular Disease

## 2012-07-25 ENCOUNTER — Ambulatory Visit (INDEPENDENT_AMBULATORY_CARE_PROVIDER_SITE_OTHER): Payer: Medicare Other | Admitting: Cardiovascular Disease

## 2012-07-25 ENCOUNTER — Other Ambulatory Visit: Payer: Self-pay

## 2012-07-25 VITALS — BP 120/70 | HR 60 | Ht 67.0 in | Wt 182.5 lb

## 2012-07-25 DIAGNOSIS — I509 Heart failure, unspecified: Secondary | ICD-10-CM

## 2012-07-25 DIAGNOSIS — I2510 Atherosclerotic heart disease of native coronary artery without angina pectoris: Secondary | ICD-10-CM

## 2012-07-25 DIAGNOSIS — E785 Hyperlipidemia, unspecified: Secondary | ICD-10-CM

## 2012-07-25 MED ORDER — LISINOPRIL 20 MG PO TABS: 20.0000 mg | ORAL_TABLET | Freq: Two times a day (BID) | ORAL | Status: DC
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
## 2012-07-25 NOTE — Patient Instructions (Addendum)
You are doing well. No medication changes were made today.  Labs done today: Bmp, Lipid/liver.  Follow up with Dr. [name] in 6 months.

## 2012-07-25 NOTE — Telephone Encounter (Signed)
Fax Received. Refill Completed. Marquell Saenz Chowoe (R.M.A)   

## 2012-07-25 NOTE — Assessment & Plan Note (Signed)
Will check fasting labs today.  I have asked him to exercise on a regular basis.  I will see him again in 6 months.

## 2012-07-25 NOTE — Assessment & Plan Note (Signed)
Elijah Saunders is doing well.  He is not having any angina.  He has been working out on his property cutting trees for the past week without angina or dyspnea or syncope.   We will continue current meds.    Will check lipds, liver enzymes, BMP today.  Will see him in 6 months for OV and repeat labs.

## 2012-07-26 LAB — BASIC METABOLIC PANEL
BUN/Creatinine Ratio: 21 (ref 10–22)
BUN: 41 mg/dL — ABNORMAL HIGH (ref 8–27)
CO2: 19 mmol/L (ref 19–28)
Chloride: 105 mmol/L (ref 97–108)
Glucose: 113 mg/dL — ABNORMAL HIGH (ref 65–99)
Potassium: 4.8 mmol/L (ref 3.5–5.2)

## 2012-07-26 LAB — LIPID PANEL
Chol/HDL Ratio: 7.5 ratio units — ABNORMAL HIGH (ref 0.0–5.0)
Cholesterol, Total: 188 mg/dL (ref 100–199)
HDL: 25 mg/dL — ABNORMAL LOW (ref >39)
Triglycerides: 323 mg/dL — ABNORMAL HIGH (ref 0–149)

## 2012-07-26 LAB — HEPATIC FUNCTION PANEL
AST: 26 IU/L (ref 0–40)
Albumin: 4.4 g/dL (ref 3.5–4.8)
Total Protein: 6.8 g/dL (ref 6.0–8.5)

## 2012-07-29 ENCOUNTER — Telehealth: Payer: Self-pay | Admitting: Cardiovascular Disease

## 2012-07-29 NOTE — Telephone Encounter (Signed)
New problem:  Test results.  

## 2012-07-29 NOTE — Telephone Encounter (Signed)
Results given.

## 2012-09-20 ENCOUNTER — Ambulatory Visit (INDEPENDENT_AMBULATORY_CARE_PROVIDER_SITE_OTHER): Payer: Medicare Other | Admitting: Ophthalmology

## 2012-09-20 DIAGNOSIS — H43819 Vitreous degeneration, unspecified eye: Secondary | ICD-10-CM

## 2012-09-20 DIAGNOSIS — E11319 Type 2 diabetes mellitus with unspecified diabetic retinopathy without macular edema: Secondary | ICD-10-CM

## 2012-09-20 DIAGNOSIS — E1165 Type 2 diabetes mellitus with hyperglycemia: Secondary | ICD-10-CM

## 2012-09-20 DIAGNOSIS — H33309 Unspecified retinal break, unspecified eye: Secondary | ICD-10-CM

## 2012-09-20 DIAGNOSIS — H35039 Hypertensive retinopathy, unspecified eye: Secondary | ICD-10-CM

## 2012-09-20 DIAGNOSIS — I10 Essential (primary) hypertension: Secondary | ICD-10-CM

## 2012-09-20 DIAGNOSIS — H2510 Age-related nuclear cataract, unspecified eye: Secondary | ICD-10-CM

## 2012-09-20 DIAGNOSIS — E1139 Type 2 diabetes mellitus with other diabetic ophthalmic complication: Secondary | ICD-10-CM

## 2012-09-20 DIAGNOSIS — H3530 Unspecified macular degeneration: Secondary | ICD-10-CM

## 2012-12-19 ENCOUNTER — Encounter: Payer: Self-pay | Admitting: Cardiovascular Disease

## 2012-12-19 ENCOUNTER — Ambulatory Visit (INDEPENDENT_AMBULATORY_CARE_PROVIDER_SITE_OTHER): Payer: Medicare Other | Admitting: Cardiovascular Disease

## 2012-12-19 VITALS — BP 138/60 | HR 59 | Ht 68.0 in | Wt 176.5 lb

## 2012-12-19 DIAGNOSIS — I5022 Chronic systolic (congestive) heart failure: Secondary | ICD-10-CM

## 2012-12-19 DIAGNOSIS — I2510 Atherosclerotic heart disease of native coronary artery without angina pectoris: Secondary | ICD-10-CM

## 2012-12-19 DIAGNOSIS — I509 Heart failure, unspecified: Secondary | ICD-10-CM

## 2012-12-19 NOTE — Assessment & Plan Note (Signed)
He is stable.  No angina. Continue meds.  He has occasional episodes of orthostasis but these only happen about every month or so. I do not think that they are severe enough to warrant decreasing his dose of carvedilol.
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
## 2012-12-19 NOTE — Patient Instructions (Addendum)
Your physician recommends that you schedule a follow-up appointment in: 6 MONTHS  Your physician recommends that you return for FASTING lab work in: 6 MONTHS  Your physician recommends that you continue on your current medications as directed. Please refer to the Current Medication list given to you today.

## 2012-12-19 NOTE — Assessment & Plan Note (Signed)
His heart failure symptoms seem to be well controlled. He's limited by his orthopedic issues and does not appear be limited by his heart failure.

## 2012-12-30 ENCOUNTER — Other Ambulatory Visit: Payer: Self-pay

## 2012-12-30 DIAGNOSIS — I509 Heart failure, unspecified: Secondary | ICD-10-CM

## 2012-12-30 MED ORDER — LISINOPRIL 20 MG PO TABS: 20.0000 mg | ORAL_TABLET | Freq: Two times a day (BID) | ORAL | Status: DC

## 2012-12-30 NOTE — Telephone Encounter (Signed)
Refilled Lisinopril sent to [facility].

## 2013-06-23 ENCOUNTER — Ambulatory Visit (INDEPENDENT_AMBULATORY_CARE_PROVIDER_SITE_OTHER): Payer: Medicare Other | Admitting: Cardiovascular Disease

## 2013-06-23 ENCOUNTER — Encounter: Payer: Self-pay | Admitting: Cardiovascular Disease

## 2013-06-23 ENCOUNTER — Other Ambulatory Visit: Payer: Medicare Other

## 2013-06-23 VITALS — BP 162/96 | HR 63 | Ht 67.0 in | Wt 184.0 lb

## 2013-06-23 DIAGNOSIS — I509 Heart failure, unspecified: Secondary | ICD-10-CM

## 2013-06-23 DIAGNOSIS — E785 Hyperlipidemia, unspecified: Secondary | ICD-10-CM

## 2013-06-23 DIAGNOSIS — I5022 Chronic systolic (congestive) heart failure: Secondary | ICD-10-CM

## 2013-06-23 DIAGNOSIS — I2510 Atherosclerotic heart disease of native coronary artery without angina pectoris: Secondary | ICD-10-CM

## 2013-06-23 MED ORDER — ASPIRIN 81 MG PO TABS: 81.0000 mg | ORAL_TABLET | Freq: Every day | ORAL | Status: DC

## 2013-06-23 NOTE — Assessment & Plan Note (Signed)
He still is eating way too much salt.  He goes out to eat 3-4 times a week and the food that he orders is typically salty ( chicken tenders, banned green beans, mashed potatoes. )   His blood his blood pressure is  little but little in today because he is also meal yesterday. At times his blood pressure is normal and also times he said orthostatic hypotension. I've strongly advised him to greatly decrease his salt intake. This will also help with his hypertension and his congestive heart failure. I'll see him again in 6 months.

## 2013-06-23 NOTE — Assessment & Plan Note (Signed)
Fasting labs today

## 2013-06-23 NOTE — Patient Instructions (Signed)
Your physician has recommended you make the following change in your medication:  Decrease Aspirin 81 mg daily  Your physician recommends that you return for lab work in:  Today Lipid profile, Liver profile, Basic metabolic panel  Your physician wants you to follow-up in: 6 months. You will receive a reminder letter in the mail two months in advance. If you don't receive a letter, please call our office to schedule the follow-up appointment.
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
## 2013-06-23 NOTE — Assessment & Plan Note (Signed)
Elijah Saunders is  not having episodes of chest pain today. His bypass surgery was approximately 12 years ago. We will check fasting lipids, liver enzymes, and basic metabolic profile today.

## 2013-06-24 ENCOUNTER — Telehealth: Payer: Self-pay | Admitting: *Deleted

## 2013-06-24 DIAGNOSIS — I509 Heart failure, unspecified: Secondary | ICD-10-CM

## 2013-06-24 LAB — HEPATIC FUNCTION PANEL
ALBUMIN: 4.5 g/dL (ref 3.5–4.8)
ALK PHOS: 61 IU/L (ref 39–117)
ALT: 24 IU/L (ref 0–44)
AST: 26 IU/L (ref 0–40)
Bilirubin, Direct: 0.17 mg/dL (ref 0.00–0.40)
Total Bilirubin: 0.7 mg/dL (ref 0.0–1.2)
Total Protein: 7.1 g/dL (ref 6.0–8.5)

## 2013-06-24 LAB — LIPID PANEL
CHOLESTEROL TOTAL: 197 mg/dL (ref 100–199)
Chol/HDL Ratio: 5.5 ratio units — ABNORMAL HIGH (ref 0.0–5.0)
HDL: 36 mg/dL — AB (ref >39)
LDL Calculated: 109 mg/dL — ABNORMAL HIGH (ref 0–99)
TRIGLYCERIDES: 258 mg/dL — AB (ref 0–149)
VLDL CHOLESTEROL CAL: 52 mg/dL — AB (ref 5–40)

## 2013-06-24 LAB — BASIC METABOLIC PANEL
BUN / CREAT RATIO: 19 (ref 10–22)
BUN: 32 mg/dL — AB (ref 8–27)
CHLORIDE: 99 mmol/L (ref 97–108)
CO2: 22 mmol/L (ref 18–29)
Calcium: 11.6 mg/dL — ABNORMAL HIGH (ref 8.6–10.2)
Creatinine, Ser: 1.66 mg/dL — ABNORMAL HIGH (ref 0.76–1.27)
GFR calc Af Amer: 45 mL/min/{1.73_m2} — ABNORMAL LOW (ref >59)
GFR calc non Af Amer: 39 mL/min/{1.73_m2} — ABNORMAL LOW (ref >59)
Glucose: 147 mg/dL — ABNORMAL HIGH (ref 65–99)
Potassium: 6.3 mmol/L — ABNORMAL HIGH (ref 3.5–5.2)
Sodium: 144 mmol/L (ref 134–144)

## 2013-06-24 MED ORDER — LISINOPRIL 20 MG PO TABS: 20.0000 mg | ORAL_TABLET | Freq: Every day | ORAL | Status: DC

## 2013-06-24 NOTE — Telephone Encounter (Signed)
Message copied by Fransico SettersBAUCOM, Zaven Klemens E on Tue Jun 24, 2013  5:04 PM ------      Message from: Antony OdeaBRILEY, MARY J      Created: Tue Jun 24, 2013  3:35 PM                   ----- Message -----         From: Vesta MixerPhilip J Nahser, MD         Sent: 06/24/2013  11:10 AM           To: Antony OdeaMary J Briley, RN            Repeat BMP today or tomorrow.  Have him decrease is lisinopril to daily ( from bID)        Recheck again in 1 week. ------

## 2013-06-24 NOTE — Telephone Encounter (Signed)
Scheduled labs and discussed med change with patient.

## 2013-06-25 ENCOUNTER — Ambulatory Visit (INDEPENDENT_AMBULATORY_CARE_PROVIDER_SITE_OTHER): Payer: Medicare Other | Admitting: *Deleted

## 2013-06-25 DIAGNOSIS — I509 Heart failure, unspecified: Secondary | ICD-10-CM

## 2013-06-26 ENCOUNTER — Telehealth: Payer: Self-pay | Admitting: *Deleted

## 2013-06-26 DIAGNOSIS — I5022 Chronic systolic (congestive) heart failure: Secondary | ICD-10-CM

## 2013-06-26 LAB — BASIC METABOLIC PANEL
BUN/Creatinine Ratio: 19 (ref 10–22)
BUN: 35 mg/dL — AB (ref 8–27)
CALCIUM: 10.8 mg/dL — AB (ref 8.6–10.2)
CO2: 21 mmol/L (ref 18–29)
Chloride: 95 mmol/L — ABNORMAL LOW (ref 97–108)
Creatinine, Ser: 1.88 mg/dL — ABNORMAL HIGH (ref 0.76–1.27)
GFR calc Af Amer: 39 mL/min/{1.73_m2} — ABNORMAL LOW (ref >59)
GFR, EST NON AFRICAN AMERICAN: 34 mL/min/{1.73_m2} — AB (ref >59)
GLUCOSE: 183 mg/dL — AB (ref 65–99)
Potassium: 5.6 mmol/L — ABNORMAL HIGH (ref 3.5–5.2)
Sodium: 140 mmol/L (ref 134–144)

## 2013-06-26 MED ORDER — AMLODIPINE BESYLATE 5 MG PO TABS: 5.0000 mg | ORAL_TABLET | Freq: Every day | ORAL | Status: DC

## 2013-06-26 NOTE — Telephone Encounter (Signed)
Informed patient of Dr. Namon CirriNahsers note. Patient verbalized understanding. Lab appt made.

## 2013-06-26 NOTE — Telephone Encounter (Signed)
Message copied by Fransico SettersBAUCOM, Ltanya Bayley E on Thu Jun 26, 2013  5:00 PM ------      Message from: Vesta MixerNAHSER, PHILIP J      Created: Thu Jun 26, 2013  3:48 PM       Its not clear to my why his renal function is going down.  Hold lisinopril completely.  Start amlodipine 5 mg a day.      Recheck in 1 week.   We may need to send him to his medical doctor ------

## 2013-06-30 ENCOUNTER — Other Ambulatory Visit: Payer: Medicare Other

## 2013-07-04 ENCOUNTER — Ambulatory Visit (INDEPENDENT_AMBULATORY_CARE_PROVIDER_SITE_OTHER): Payer: Medicare Other | Admitting: *Deleted

## 2013-07-04 DIAGNOSIS — I5022 Chronic systolic (congestive) heart failure: Secondary | ICD-10-CM

## 2013-07-04 DIAGNOSIS — I509 Heart failure, unspecified: Secondary | ICD-10-CM

## 2013-07-05 LAB — BASIC METABOLIC PANEL
BUN/Creatinine Ratio: 18 (ref 10–22)
BUN: 30 mg/dL — ABNORMAL HIGH (ref 8–27)
CALCIUM: 9.9 mg/dL (ref 8.6–10.2)
CO2: 22 mmol/L (ref 18–29)
CREATININE: 1.65 mg/dL — AB (ref 0.76–1.27)
Chloride: 98 mmol/L (ref 97–108)
GFR calc Af Amer: 46 mL/min/{1.73_m2} — ABNORMAL LOW (ref >59)
GFR calc non Af Amer: 39 mL/min/{1.73_m2} — ABNORMAL LOW (ref >59)
GLUCOSE: 198 mg/dL — AB (ref 65–99)
Potassium: 4.8 mmol/L (ref 3.5–5.2)
Sodium: 138 mmol/L (ref 134–144)

## 2013-07-07 ENCOUNTER — Ambulatory Visit (INDEPENDENT_AMBULATORY_CARE_PROVIDER_SITE_OTHER): Payer: Medicare Other | Admitting: Ophthalmology

## 2013-07-07 DIAGNOSIS — E11319 Type 2 diabetes mellitus with unspecified diabetic retinopathy without macular edema: Secondary | ICD-10-CM

## 2013-07-07 DIAGNOSIS — H3530 Unspecified macular degeneration: Secondary | ICD-10-CM

## 2013-07-07 DIAGNOSIS — H33309 Unspecified retinal break, unspecified eye: Secondary | ICD-10-CM

## 2013-07-07 DIAGNOSIS — H43819 Vitreous degeneration, unspecified eye: Secondary | ICD-10-CM

## 2013-07-07 DIAGNOSIS — E1165 Type 2 diabetes mellitus with hyperglycemia: Secondary | ICD-10-CM

## 2013-07-07 DIAGNOSIS — E1139 Type 2 diabetes mellitus with other diabetic ophthalmic complication: Secondary | ICD-10-CM

## 2013-07-14 ENCOUNTER — Telehealth: Payer: Self-pay | Admitting: *Deleted

## 2013-07-14 NOTE — Telephone Encounter (Signed)
Patient called and feels like the medicine Amlodipine 5mg  is causing his feet to hurt causing the gout to flair up. Please advise

## 2013-07-14 NOTE — Telephone Encounter (Signed)
Instructed patient that per Dr. Elease HashimotoNahser he needs to see his PCP about his foot pain. Patient verbalized understanding.

## 2013-07-14 NOTE — Telephone Encounter (Signed)
Patient called and feels like the medicine Amlodipine 5mg  is causing his feet to hurt causing the gout to flair up.  I informed patient that if he does have gout it is most likely not related to amlodipine and he would probably need to see his PCP.  He asked me to run it by Dr. Elease HashimotoNahser because he is not sure it is gout.  He said it is severe pain in cramping in his heel and between his toes.

## 2013-07-25 ENCOUNTER — Other Ambulatory Visit: Payer: Self-pay

## 2013-07-25 MED ORDER — CARVEDILOL 6.25 MG PO TABS: 6.2500 mg | ORAL_TABLET | Freq: Two times a day (BID) | ORAL | Status: DC

## 2013-07-25 NOTE — Telephone Encounter (Signed)
Refill sent for carvedilol.  

## 2013-08-04 ENCOUNTER — Telehealth: Payer: Self-pay

## 2013-08-04 NOTE — Telephone Encounter (Signed)
Hold amlodipine for 1-2 weeks.  See if his foot pain improves.   Will have him see me in the next several weeks for further evaluation.

## 2013-08-04 NOTE — Telephone Encounter (Signed)
Patient called and stated he really feels like the Norvasc is causing the joint pain in his feet He is aware that Dr. Elease HashimotoNahser does not think the medication is the cause of the problem but he really wants to stop it or try something different

## 2013-08-04 NOTE — Telephone Encounter (Signed)
Pt called and has questions regarding his BP medication. Pt states he has a lot of joint problems since he has started taking this; would like to be switched to something different.

## 2013-08-04 NOTE — Telephone Encounter (Signed)
Instructed patient that per Dr. Elease HashimotoNahser hold Amlodipine for 1-2 weeks and see if foot improves.  Patient also instructed to monitor his blood pressure during this time and let us know if it became elevated  Patient verbalized understanding   Appt made for 08/19/13 at 11:30 am

## 2013-08-13 ENCOUNTER — Telehealth: Payer: Self-pay | Admitting: *Deleted

## 2013-08-13 NOTE — Telephone Encounter (Signed)
Patient stated that his feet stopped hurting when he stopped his Norvasc  He does not wish to restart the Norvasc  He stated his blood pressure "has been well controlled most of the time" It has reached a maximum of 160 SBP but is usually not that elevated  He will bring his blood pressure reading to his appt with Dr. Elease HashimotoNahser this week

## 2013-08-13 NOTE — Telephone Encounter (Signed)
Patient called and having problems with is bp medicine Amlodipine 5mg  . He was having cramps in feet. Please call patient as he is not sure what to do.

## 2013-08-13 NOTE — Telephone Encounter (Signed)
Agree with plan to hold the Norvasc for now.  We will review his BP readings at next office visit.

## 2013-08-19 ENCOUNTER — Encounter: Payer: Self-pay | Admitting: Cardiovascular Disease

## 2013-08-19 ENCOUNTER — Ambulatory Visit (INDEPENDENT_AMBULATORY_CARE_PROVIDER_SITE_OTHER): Payer: Medicare Other | Admitting: Cardiovascular Disease

## 2013-08-19 VITALS — BP 140/75 | HR 62 | Ht 67.5 in | Wt 184.0 lb

## 2013-08-19 DIAGNOSIS — I509 Heart failure, unspecified: Secondary | ICD-10-CM

## 2013-08-19 DIAGNOSIS — N289 Disorder of kidney and ureter, unspecified: Secondary | ICD-10-CM

## 2013-08-19 DIAGNOSIS — I10 Essential (primary) hypertension: Secondary | ICD-10-CM

## 2013-08-19 DIAGNOSIS — I5022 Chronic systolic (congestive) heart failure: Secondary | ICD-10-CM

## 2013-08-19 DIAGNOSIS — E785 Hyperlipidemia, unspecified: Secondary | ICD-10-CM

## 2013-08-19 MED ORDER — PRAVASTATIN SODIUM 40 MG PO TABS: 40.0000 mg | ORAL_TABLET | Freq: Every evening | ORAL | Status: DC

## 2013-08-19 NOTE — Assessment & Plan Note (Signed)
He has mild systolic congestive heart failure. Unfortunately, he developed some acute on chronic renal insufficiency and we were forced to discontinue the lisinopril. Will follow him for 3 months. I anticipate retrying the lisinopril at some point in the near future.    He only has 1 kidney.    We will schedule him for a renal artery duplex scan to look for renal artery stenosis.

## 2013-08-19 NOTE — Patient Instructions (Signed)
Your physician has recommended you make the following change in your medication:  Start Pravachol 40 mg daily  Your physician has requested that you have a renal artery duplex. During this test, an ultrasound is used to evaluate blood flow to the kidneys. Allow one hour for this exam. Do not eat after midnight the day before and avoid carbonated beverages. Take your medications as you usually do.  Your physician recommends that you schedule a follow-up appointment in:  3 months   Your physician recommends that you return for lab work in:  3 months  BMP  Lipid and liver profile

## 2013-08-19 NOTE — Assessment & Plan Note (Signed)
He has been intolerant to atorvastatin and crestor.  Will try pravachol 40 .

## 2013-08-19 NOTE — Assessment & Plan Note (Signed)
He denies having any episodes of chest pain. Continue the same medications. His LDL is 109. He has been intolerant to several statins. We will try him on Pravachol.
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
## 2013-09-02 ENCOUNTER — Encounter (HOSPITAL_BASED_OUTPATIENT_CLINIC_OR_DEPARTMENT_OTHER): Payer: Medicare Other

## 2013-09-02 DIAGNOSIS — I10 Essential (primary) hypertension: Secondary | ICD-10-CM

## 2013-10-08 ENCOUNTER — Encounter (INDEPENDENT_AMBULATORY_CARE_PROVIDER_SITE_OTHER): Payer: Medicare Other | Admitting: Ophthalmology

## 2013-10-08 DIAGNOSIS — H33309 Unspecified retinal break, unspecified eye: Secondary | ICD-10-CM

## 2013-10-08 DIAGNOSIS — I10 Essential (primary) hypertension: Secondary | ICD-10-CM

## 2013-10-08 DIAGNOSIS — E1139 Type 2 diabetes mellitus with other diabetic ophthalmic complication: Secondary | ICD-10-CM

## 2013-10-08 DIAGNOSIS — H3530 Unspecified macular degeneration: Secondary | ICD-10-CM

## 2013-10-08 DIAGNOSIS — E1165 Type 2 diabetes mellitus with hyperglycemia: Secondary | ICD-10-CM

## 2013-10-08 DIAGNOSIS — E11319 Type 2 diabetes mellitus with unspecified diabetic retinopathy without macular edema: Secondary | ICD-10-CM

## 2013-10-08 DIAGNOSIS — H35039 Hypertensive retinopathy, unspecified eye: Secondary | ICD-10-CM

## 2013-11-18 ENCOUNTER — Ambulatory Visit (INDEPENDENT_AMBULATORY_CARE_PROVIDER_SITE_OTHER): Payer: Medicare Other | Admitting: Cardiovascular Disease

## 2013-11-18 ENCOUNTER — Encounter: Payer: Self-pay | Admitting: Cardiovascular Disease

## 2013-11-18 ENCOUNTER — Ambulatory Visit (INDEPENDENT_AMBULATORY_CARE_PROVIDER_SITE_OTHER): Payer: Medicare Other | Admitting: *Deleted

## 2013-11-18 VITALS — BP 138/80 | HR 60 | Ht 68.0 in | Wt 179.5 lb

## 2013-11-18 DIAGNOSIS — I10 Essential (primary) hypertension: Secondary | ICD-10-CM

## 2013-11-18 DIAGNOSIS — I2510 Atherosclerotic heart disease of native coronary artery without angina pectoris: Secondary | ICD-10-CM

## 2013-11-18 DIAGNOSIS — I5022 Chronic systolic (congestive) heart failure: Secondary | ICD-10-CM

## 2013-11-18 DIAGNOSIS — I509 Heart failure, unspecified: Secondary | ICD-10-CM

## 2013-11-18 DIAGNOSIS — N289 Disorder of kidney and ureter, unspecified: Secondary | ICD-10-CM

## 2013-11-18 NOTE — Assessment & Plan Note (Signed)
Elijah Saunders is doing ok.  We have tried ACE - I  But he was not able to tolerate it due to renal insufficiency.   For now, we will continue with his current meds.    He seems to be relatively stable
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
## 2013-11-18 NOTE — Assessment & Plan Note (Signed)
He's doing well. He's not had any episodes of angina. He does not tolerate any of the statins. He will remain as active as possible.  I will see him  again in 6 months

## 2013-11-18 NOTE — Patient Instructions (Signed)
Your physician wants you to follow-up in: 6 months  You will receive a reminder letter in the mail two months in advance. If you don't receive a letter, please call our office to schedule the follow-up appointment.  Your physician recommends that you continue on your current medications as directed. Please refer to the Current Medication list given to you today.  

## 2013-11-19 LAB — LIPID PANEL
CHOL/HDL RATIO: 6.8 ratio — AB (ref 0.0–5.0)
CHOLESTEROL TOTAL: 204 mg/dL — AB (ref 100–199)
HDL: 30 mg/dL — ABNORMAL LOW (ref >39)
LDL Calculated: 110 mg/dL — ABNORMAL HIGH (ref 0–99)
TRIGLYCERIDES: 321 mg/dL — AB (ref 0–149)
VLDL CHOLESTEROL CAL: 64 mg/dL — AB (ref 5–40)

## 2013-11-19 LAB — HEPATIC FUNCTION PANEL
ALBUMIN: 4.3 g/dL (ref 3.5–4.8)
ALK PHOS: 63 IU/L (ref 39–117)
ALT: 25 IU/L (ref 0–44)
AST: 26 IU/L (ref 0–40)
BILIRUBIN DIRECT: 0.13 mg/dL (ref 0.00–0.40)
Total Bilirubin: 0.5 mg/dL (ref 0.0–1.2)
Total Protein: 6.6 g/dL (ref 6.0–8.5)

## 2013-11-19 LAB — BASIC METABOLIC PANEL
BUN/Creatinine Ratio: 15 (ref 10–22)
BUN: 23 mg/dL (ref 8–27)
CHLORIDE: 97 mmol/L (ref 97–108)
CO2: 24 mmol/L (ref 18–29)
Calcium: 10.8 mg/dL — ABNORMAL HIGH (ref 8.6–10.2)
Creatinine, Ser: 1.5 mg/dL — ABNORMAL HIGH (ref 0.76–1.27)
GFR, EST AFRICAN AMERICAN: 51 mL/min/{1.73_m2} — AB (ref >59)
GFR, EST NON AFRICAN AMERICAN: 44 mL/min/{1.73_m2} — AB (ref >59)
Glucose: 168 mg/dL — ABNORMAL HIGH (ref 65–99)
POTASSIUM: 4.4 mmol/L (ref 3.5–5.2)
Sodium: 140 mmol/L (ref 134–144)

## 2013-11-24 ENCOUNTER — Telehealth: Payer: Self-pay

## 2013-11-24 NOTE — Telephone Encounter (Signed)
Discussed preliminary results with patient  Informed patient that I would call with Dr. Namon CirriNahsers results as soon as they were available  Patient verbalized understanding

## 2013-11-24 NOTE — Telephone Encounter (Signed)
Pt would like lab results.  

## 2014-04-10 ENCOUNTER — Ambulatory Visit (INDEPENDENT_AMBULATORY_CARE_PROVIDER_SITE_OTHER): Payer: Medicare Other | Admitting: Ophthalmology

## 2014-04-10 DIAGNOSIS — H3531 Nonexudative age-related macular degeneration: Secondary | ICD-10-CM

## 2014-04-10 DIAGNOSIS — E11319 Type 2 diabetes mellitus with unspecified diabetic retinopathy without macular edema: Secondary | ICD-10-CM

## 2014-04-10 DIAGNOSIS — H43813 Vitreous degeneration, bilateral: Secondary | ICD-10-CM

## 2014-04-10 DIAGNOSIS — H35033 Hypertensive retinopathy, bilateral: Secondary | ICD-10-CM

## 2014-04-10 DIAGNOSIS — I10 Essential (primary) hypertension: Secondary | ICD-10-CM

## 2014-04-10 DIAGNOSIS — H33302 Unspecified retinal break, left eye: Secondary | ICD-10-CM

## 2014-04-10 DIAGNOSIS — E11329 Type 2 diabetes mellitus with mild nonproliferative diabetic retinopathy without macular edema: Secondary | ICD-10-CM

## 2014-05-28 ENCOUNTER — Ambulatory Visit (INDEPENDENT_AMBULATORY_CARE_PROVIDER_SITE_OTHER): Payer: Medicare Other | Admitting: Cardiovascular Disease

## 2014-05-28 ENCOUNTER — Encounter: Payer: Self-pay | Admitting: Cardiovascular Disease

## 2014-05-28 VITALS — BP 150/68 | HR 70 | Ht 68.0 in | Wt 182.0 lb

## 2014-05-28 DIAGNOSIS — I2510 Atherosclerotic heart disease of native coronary artery without angina pectoris: Secondary | ICD-10-CM

## 2014-05-28 DIAGNOSIS — E785 Hyperlipidemia, unspecified: Secondary | ICD-10-CM

## 2014-05-28 DIAGNOSIS — I5022 Chronic systolic (congestive) heart failure: Secondary | ICD-10-CM

## 2014-05-28 MED ORDER — CARVEDILOL 12.5 MG PO TABS: 12.5000 mg | ORAL_TABLET | Freq: Two times a day (BID) | ORAL | Status: DC

## 2014-05-28 NOTE — Patient Instructions (Signed)
Your physician has recommended you make the following change in your medication:  Increase Coreg to 12.5 mg twice daily   Your physician recommends that you have labs today: Lipid and Liver panel  BMP  Your physician wants you to follow-up in: 6 months with Dr. Elease HashimotoNahser. You will receive a reminder letter in the mail two months in advance. If you don't receive a letter, please call our office to schedule the follow-up appointment.  Your physician recommends that you return for lab work in: At your 6 month follow up. Lipid and Liver panel  BMP
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
## 2014-05-29 LAB — BASIC METABOLIC PANEL
BUN / CREAT RATIO: 14 (ref 10–22)
BUN: 20 mg/dL (ref 8–27)
CALCIUM: 9.3 mg/dL (ref 8.6–10.2)
CHLORIDE: 101 mmol/L (ref 97–108)
CO2: 22 mmol/L (ref 18–29)
Creatinine, Ser: 1.38 mg/dL — ABNORMAL HIGH (ref 0.76–1.27)
GFR calc Af Amer: 56 mL/min/{1.73_m2} — ABNORMAL LOW (ref >59)
GFR calc non Af Amer: 49 mL/min/{1.73_m2} — ABNORMAL LOW (ref >59)
GLUCOSE: 142 mg/dL — AB (ref 65–99)
Potassium: 4.4 mmol/L (ref 3.5–5.2)
Sodium: 140 mmol/L (ref 134–144)

## 2014-05-29 LAB — HEPATIC FUNCTION PANEL
ALT: 18 IU/L (ref 0–44)
AST: 20 IU/L (ref 0–40)
Albumin: 4.3 g/dL (ref 3.5–4.8)
Alkaline Phosphatase: 64 IU/L (ref 39–117)
Bilirubin, Direct: 0.18 mg/dL (ref 0.00–0.40)
Total Bilirubin: 0.5 mg/dL (ref 0.0–1.2)
Total Protein: 7 g/dL (ref 6.0–8.5)

## 2014-05-29 LAB — LIPID PANEL
CHOL/HDL RATIO: 6.1 ratio — AB (ref 0.0–5.0)
Cholesterol, Total: 176 mg/dL (ref 100–199)
HDL: 29 mg/dL — ABNORMAL LOW (ref >39)
LDL Calculated: 94 mg/dL (ref 0–99)
Triglycerides: 264 mg/dL — ABNORMAL HIGH (ref 0–149)
VLDL Cholesterol Cal: 53 mg/dL — ABNORMAL HIGH (ref 5–40)

## 2014-10-13 ENCOUNTER — Ambulatory Visit (INDEPENDENT_AMBULATORY_CARE_PROVIDER_SITE_OTHER): Payer: Medicare Other | Admitting: Ophthalmology

## 2014-10-13 DIAGNOSIS — E11319 Type 2 diabetes mellitus with unspecified diabetic retinopathy without macular edema: Secondary | ICD-10-CM

## 2014-10-13 DIAGNOSIS — H43813 Vitreous degeneration, bilateral: Secondary | ICD-10-CM

## 2014-10-13 DIAGNOSIS — H33303 Unspecified retinal break, bilateral: Secondary | ICD-10-CM

## 2014-10-13 DIAGNOSIS — I10 Essential (primary) hypertension: Secondary | ICD-10-CM | POA: Diagnosis not present

## 2014-10-13 DIAGNOSIS — E11329 Type 2 diabetes mellitus with mild nonproliferative diabetic retinopathy without macular edema: Secondary | ICD-10-CM | POA: Diagnosis not present

## 2014-10-13 DIAGNOSIS — H35033 Hypertensive retinopathy, bilateral: Secondary | ICD-10-CM | POA: Diagnosis not present

## 2014-10-13 DIAGNOSIS — H3531 Nonexudative age-related macular degeneration: Secondary | ICD-10-CM

## 2014-11-27 ENCOUNTER — Ambulatory Visit (INDEPENDENT_AMBULATORY_CARE_PROVIDER_SITE_OTHER): Payer: Medicare Other | Admitting: Cardiovascular Disease

## 2014-11-27 ENCOUNTER — Encounter: Payer: Self-pay | Admitting: Cardiovascular Disease

## 2014-11-27 VITALS — BP 140/72 | HR 58 | Ht 68.0 in | Wt 177.0 lb

## 2014-11-27 DIAGNOSIS — I2510 Atherosclerotic heart disease of native coronary artery without angina pectoris: Secondary | ICD-10-CM | POA: Diagnosis not present

## 2014-11-27 DIAGNOSIS — I5022 Chronic systolic (congestive) heart failure: Secondary | ICD-10-CM

## 2014-11-27 DIAGNOSIS — E785 Hyperlipidemia, unspecified: Secondary | ICD-10-CM | POA: Diagnosis not present

## 2014-11-27 MED ORDER — HYDRALAZINE HCL 10 MG PO TABS: 10.0000 mg | ORAL_TABLET | Freq: Three times a day (TID) | ORAL | Status: DC

## 2014-11-27 MED ORDER — ISOSORBIDE MONONITRATE ER 30 MG PO TB24: 30.0000 mg | ORAL_TABLET | Freq: Every day | ORAL | Status: DC

## 2014-11-27 NOTE — Patient Instructions (Signed)
Medication Instructions:  Your physician has recommended you make the following change in your medication:  START taking Imdur  once per day START taking Hydralazine  three times a day   Labwork: Your physician recommends that you return for lab work in 3 months: BMP, lipid, liver. Nothing to eat or drink after midnight the evening before your labs.    Testing/Procedures: none  Follow-Up: Your physician recommends that you schedule a follow-up appointment in: three months with Dr. [name]   Any Other Special Instructions Will Be Listed Below (If Applicable).
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
## 2014-12-29 ENCOUNTER — Other Ambulatory Visit: Payer: Self-pay | Admitting: *Deleted

## 2014-12-29 MED ORDER — CARVEDILOL 12.5 MG PO TABS: 12.5000 mg | ORAL_TABLET | Freq: Two times a day (BID) | ORAL | Status: DC

## 2015-03-17 ENCOUNTER — Ambulatory Visit (INDEPENDENT_AMBULATORY_CARE_PROVIDER_SITE_OTHER): Payer: Medicare Other | Admitting: Cardiovascular Disease

## 2015-03-17 ENCOUNTER — Other Ambulatory Visit: Payer: Medicare Other

## 2015-03-17 ENCOUNTER — Encounter: Payer: Self-pay | Admitting: Cardiovascular Disease

## 2015-03-17 VITALS — BP 158/82 | HR 81 | Ht 68.0 in | Wt 177.0 lb

## 2015-03-17 DIAGNOSIS — I509 Heart failure, unspecified: Secondary | ICD-10-CM

## 2015-03-17 DIAGNOSIS — I2510 Atherosclerotic heart disease of native coronary artery without angina pectoris: Secondary | ICD-10-CM

## 2015-03-17 DIAGNOSIS — I5022 Chronic systolic (congestive) heart failure: Secondary | ICD-10-CM | POA: Diagnosis not present

## 2015-03-17 DIAGNOSIS — E785 Hyperlipidemia, unspecified: Secondary | ICD-10-CM

## 2015-03-17 NOTE — Patient Instructions (Signed)
Medication Instructions:  Your physician recommends that you continue on your current medications as directed. Please refer to the Current Medication list given to you today.   Labwork: Lipid, liver, BMP today  Testing/Procedures: none  Follow-Up: Your physician wants you to follow-up in: six months with Dr. Elease HashimotoNahser. You will receive a reminder letter in the mail two months in advance. If you don't receive a letter, please call our office to schedule the follow-up appointment.   Any Other Special Instructions Will Be Listed Below (If Applicable).     If you need a refill on your cardiac medications before your next appointment, please call your pharmacy.
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
## 2015-03-18 LAB — BASIC METABOLIC PANEL
BUN/Creatinine Ratio: 19 (ref 10–22)
BUN: 26 mg/dL (ref 8–27)
CALCIUM: 10.8 mg/dL — AB (ref 8.6–10.2)
CO2: 21 mmol/L (ref 18–29)
Chloride: 101 mmol/L (ref 97–106)
Creatinine, Ser: 1.35 mg/dL — ABNORMAL HIGH (ref 0.76–1.27)
GFR calc Af Amer: 57 mL/min/{1.73_m2} — ABNORMAL LOW (ref >59)
GFR, EST NON AFRICAN AMERICAN: 50 mL/min/{1.73_m2} — AB (ref >59)
Glucose: 140 mg/dL — ABNORMAL HIGH (ref 65–99)
Potassium: 5.4 mmol/L — ABNORMAL HIGH (ref 3.5–5.2)
Sodium: 141 mmol/L (ref 136–144)

## 2015-03-18 LAB — HEPATIC FUNCTION PANEL
ALBUMIN: 4.5 g/dL (ref 3.5–4.8)
ALT: 15 IU/L (ref 0–44)
AST: 19 IU/L (ref 0–40)
Alkaline Phosphatase: 67 IU/L (ref 39–117)
Bilirubin Total: 0.7 mg/dL (ref 0.0–1.2)
Bilirubin, Direct: 0.19 mg/dL (ref 0.00–0.40)
Total Protein: 7.3 g/dL (ref 6.0–8.5)

## 2015-03-18 LAB — LIPID PANEL
CHOL/HDL RATIO: 5.3 ratio — AB (ref 0.0–5.0)
Cholesterol, Total: 203 mg/dL — ABNORMAL HIGH (ref 100–199)
HDL: 38 mg/dL — AB (ref >39)
LDL Calculated: 116 mg/dL — ABNORMAL HIGH (ref 0–99)
Triglycerides: 247 mg/dL — ABNORMAL HIGH (ref 0–149)
VLDL CHOLESTEROL CAL: 49 mg/dL — AB (ref 5–40)

## 2015-03-26 ENCOUNTER — Other Ambulatory Visit: Payer: Self-pay

## 2015-03-26 DIAGNOSIS — I2510 Atherosclerotic heart disease of native coronary artery without angina pectoris: Secondary | ICD-10-CM

## 2015-03-26 DIAGNOSIS — I5022 Chronic systolic (congestive) heart failure: Secondary | ICD-10-CM

## 2015-03-26 MED ORDER — HYDRALAZINE HCL 10 MG PO TABS: 10.0000 mg | ORAL_TABLET | Freq: Three times a day (TID) | ORAL | Status: DC

## 2015-03-26 MED ORDER — ISOSORBIDE MONONITRATE ER 30 MG PO TB24: 30.0000 mg | ORAL_TABLET | Freq: Every day | ORAL | Status: DC

## 2015-03-26 NOTE — Telephone Encounter (Signed)
Refill sent for isosorbide mono 30 mg

## 2015-03-26 NOTE — Telephone Encounter (Signed)
Refill sent for Hydralazine 10 mg

## 2015-04-29 ENCOUNTER — Other Ambulatory Visit: Payer: Self-pay

## 2015-04-29 MED ORDER — CARVEDILOL 12.5 MG PO TABS: 12.5000 mg | ORAL_TABLET | Freq: Two times a day (BID) | ORAL | Status: DC

## 2015-04-29 NOTE — Telephone Encounter (Signed)
Refill sent for carvedilol.  

## 2015-07-13 ENCOUNTER — Ambulatory Visit (INDEPENDENT_AMBULATORY_CARE_PROVIDER_SITE_OTHER): Payer: Medicare Other | Admitting: Ophthalmology

## 2015-07-13 DIAGNOSIS — H353132 Nonexudative age-related macular degeneration, bilateral, intermediate dry stage: Secondary | ICD-10-CM

## 2015-07-13 DIAGNOSIS — E11319 Type 2 diabetes mellitus with unspecified diabetic retinopathy without macular edema: Secondary | ICD-10-CM | POA: Diagnosis not present

## 2015-07-13 DIAGNOSIS — H43813 Vitreous degeneration, bilateral: Secondary | ICD-10-CM | POA: Diagnosis not present

## 2015-07-13 DIAGNOSIS — I10 Essential (primary) hypertension: Secondary | ICD-10-CM

## 2015-07-13 DIAGNOSIS — H35033 Hypertensive retinopathy, bilateral: Secondary | ICD-10-CM | POA: Diagnosis not present

## 2015-07-13 DIAGNOSIS — H2513 Age-related nuclear cataract, bilateral: Secondary | ICD-10-CM | POA: Diagnosis not present

## 2015-07-13 DIAGNOSIS — E113293 Type 2 diabetes mellitus with mild nonproliferative diabetic retinopathy without macular edema, bilateral: Secondary | ICD-10-CM | POA: Diagnosis not present

## 2015-07-28 ENCOUNTER — Other Ambulatory Visit: Payer: Self-pay | Admitting: *Deleted

## 2015-07-28 DIAGNOSIS — I5022 Chronic systolic (congestive) heart failure: Secondary | ICD-10-CM

## 2015-07-28 DIAGNOSIS — I2510 Atherosclerotic heart disease of native coronary artery without angina pectoris: Secondary | ICD-10-CM

## 2015-07-28 MED ORDER — HYDRALAZINE HCL 10 MG PO TABS: 10.0000 mg | ORAL_TABLET | Freq: Three times a day (TID) | ORAL | Status: DC

## 2015-07-28 NOTE — Telephone Encounter (Signed)
Requested Prescriptions   Signed Prescriptions Disp Refills  . hydrALAZINE (APRESOLINE) 10 MG tablet 90 tablet 3    Sig: Take 1 tablet (10 mg total) by mouth 3 (three) times daily.    Authorizing Provider: Vesta MixerNAHSER, PHILIP J    Ordering User: Kendrick FriesLOPEZ, Matayah Reyburn C

## 2015-08-31 ENCOUNTER — Other Ambulatory Visit: Payer: Self-pay | Admitting: *Deleted

## 2015-08-31 ENCOUNTER — Other Ambulatory Visit: Payer: Self-pay

## 2015-08-31 MED ORDER — CARVEDILOL 12.5 MG PO TABS
12.5000 mg | ORAL_TABLET | Freq: Two times a day (BID) | ORAL | Status: DC
Start: 2015-08-31 — End: 2015-08-31

## 2015-08-31 MED ORDER — CARVEDILOL 12.5 MG PO TABS: 12.5000 mg | ORAL_TABLET | Freq: Two times a day (BID) | ORAL | Status: DC

## 2015-08-31 NOTE — Telephone Encounter (Signed)
Requested Prescriptions   Signed Prescriptions Disp Refills  . carvedilol (COREG) 12.5 MG tablet 60 tablet 3    Sig: Take 1 tablet (12.5 mg total) by mouth 2 (two) times daily.    Authorizing Provider: Vesta MixerNAHSER, PHILIP J    Ordering User: Kendrick FriesLOPEZ, MARINA C

## 2015-08-31 NOTE — Telephone Encounter (Signed)
Refill sent for carvedilol.  

## 2015-09-04 ENCOUNTER — Emergency Department (HOSPITAL_COMMUNITY): Payer: Medicare Other

## 2015-09-04 ENCOUNTER — Encounter (HOSPITAL_COMMUNITY): Payer: Self-pay | Admitting: Nurse Practitioner

## 2015-09-04 ENCOUNTER — Inpatient Hospital Stay (HOSPITAL_COMMUNITY)
Admission: EM | Admit: 2015-09-04 | Discharge: 2015-09-08 | DRG: 291 | Disposition: A | Discharge status: Home / Self Care | Payer: Medicare Other | Attending: Internal Medicine | Admitting: Internal Medicine

## 2015-09-04 DIAGNOSIS — Z905 Acquired absence of kidney: Secondary | ICD-10-CM | POA: Diagnosis not present

## 2015-09-04 DIAGNOSIS — I493 Ventricular premature depolarization: Secondary | ICD-10-CM | POA: Diagnosis present

## 2015-09-04 DIAGNOSIS — E1122 Type 2 diabetes mellitus with diabetic chronic kidney disease: Secondary | ICD-10-CM | POA: Diagnosis present

## 2015-09-04 DIAGNOSIS — I5043 Acute on chronic combined systolic (congestive) and diastolic (congestive) heart failure: Secondary | ICD-10-CM | POA: Diagnosis present

## 2015-09-04 DIAGNOSIS — Z789 Other specified health status: Secondary | ICD-10-CM

## 2015-09-04 DIAGNOSIS — I5023 Acute on chronic systolic (congestive) heart failure: Secondary | ICD-10-CM

## 2015-09-04 DIAGNOSIS — Z87891 Personal history of nicotine dependence: Secondary | ICD-10-CM | POA: Diagnosis not present

## 2015-09-04 DIAGNOSIS — Z951 Presence of aortocoronary bypass graft: Secondary | ICD-10-CM | POA: Diagnosis not present

## 2015-09-04 DIAGNOSIS — R0600 Dyspnea, unspecified: Secondary | ICD-10-CM | POA: Diagnosis present

## 2015-09-04 DIAGNOSIS — Z79899 Other long term (current) drug therapy: Secondary | ICD-10-CM | POA: Diagnosis not present

## 2015-09-04 DIAGNOSIS — I130 Hypertensive heart and chronic kidney disease with heart failure and stage 1 through stage 4 chronic kidney disease, or unspecified chronic kidney disease: Secondary | ICD-10-CM | POA: Diagnosis present

## 2015-09-04 DIAGNOSIS — I959 Hypotension, unspecified: Secondary | ICD-10-CM | POA: Diagnosis not present

## 2015-09-04 DIAGNOSIS — I252 Old myocardial infarction: Secondary | ICD-10-CM

## 2015-09-04 DIAGNOSIS — Z7982 Long term (current) use of aspirin: Secondary | ICD-10-CM | POA: Diagnosis not present

## 2015-09-04 DIAGNOSIS — Z7984 Long term (current) use of oral hypoglycemic drugs: Secondary | ICD-10-CM

## 2015-09-04 DIAGNOSIS — E785 Hyperlipidemia, unspecified: Secondary | ICD-10-CM | POA: Diagnosis present

## 2015-09-04 DIAGNOSIS — R9431 Abnormal electrocardiogram [ECG] [EKG]: Secondary | ICD-10-CM | POA: Diagnosis not present

## 2015-09-04 DIAGNOSIS — R609 Edema, unspecified: Secondary | ICD-10-CM

## 2015-09-04 DIAGNOSIS — I255 Ischemic cardiomyopathy: Secondary | ICD-10-CM | POA: Diagnosis present

## 2015-09-04 DIAGNOSIS — I4819 Other persistent atrial fibrillation: Secondary | ICD-10-CM | POA: Diagnosis present

## 2015-09-04 DIAGNOSIS — Z8679 Personal history of other diseases of the circulatory system: Secondary | ICD-10-CM

## 2015-09-04 DIAGNOSIS — I2510 Atherosclerotic heart disease of native coronary artery without angina pectoris: Secondary | ICD-10-CM | POA: Diagnosis present

## 2015-09-04 DIAGNOSIS — N183 Chronic kidney disease, stage 3 unspecified: Secondary | ICD-10-CM | POA: Diagnosis present

## 2015-09-04 DIAGNOSIS — J439 Emphysema, unspecified: Secondary | ICD-10-CM | POA: Diagnosis present

## 2015-09-04 DIAGNOSIS — I5033 Acute on chronic diastolic (congestive) heart failure: Secondary | ICD-10-CM | POA: Diagnosis not present

## 2015-09-04 DIAGNOSIS — J982 Interstitial emphysema: Secondary | ICD-10-CM | POA: Diagnosis present

## 2015-09-04 DIAGNOSIS — I5021 Acute systolic (congestive) heart failure: Secondary | ICD-10-CM | POA: Diagnosis not present

## 2015-09-04 DIAGNOSIS — I4891 Unspecified atrial fibrillation: Secondary | ICD-10-CM | POA: Diagnosis not present

## 2015-09-04 DIAGNOSIS — N289 Disorder of kidney and ureter, unspecified: Secondary | ICD-10-CM | POA: Diagnosis not present

## 2015-09-04 DIAGNOSIS — I480 Paroxysmal atrial fibrillation: Secondary | ICD-10-CM | POA: Diagnosis present

## 2015-09-04 DIAGNOSIS — N179 Acute kidney failure, unspecified: Secondary | ICD-10-CM | POA: Diagnosis not present

## 2015-09-04 DIAGNOSIS — Z888 Allergy status to other drugs, medicaments and biological substances status: Secondary | ICD-10-CM | POA: Diagnosis not present

## 2015-09-04 DIAGNOSIS — J9621 Acute and chronic respiratory failure with hypoxia: Secondary | ICD-10-CM | POA: Diagnosis present

## 2015-09-04 DIAGNOSIS — I83893 Varicose veins of bilateral lower extremities with other complications: Secondary | ICD-10-CM | POA: Diagnosis present

## 2015-09-04 DIAGNOSIS — E1129 Type 2 diabetes mellitus with other diabetic kidney complication: Secondary | ICD-10-CM | POA: Diagnosis present

## 2015-09-04 DIAGNOSIS — Z9889 Other specified postprocedural states: Secondary | ICD-10-CM

## 2015-09-04 DIAGNOSIS — I340 Nonrheumatic mitral (valve) insufficiency: Secondary | ICD-10-CM | POA: Diagnosis not present

## 2015-09-04 HISTORY — DX: Chronic kidney disease, stage 3 (moderate): N18.3

## 2015-09-04 HISTORY — DX: Unspecified intestinal obstruction, unspecified as to partial versus complete obstruction: K56.609

## 2015-09-04 HISTORY — DX: Chronic kidney disease, stage 3 unspecified: N18.30

## 2015-09-04 LAB — CBC WITH DIFFERENTIAL/PLATELET
BASOS ABS: 0.1 10*3/uL (ref 0.0–0.1)
Basophils Relative: 1 %
EOS ABS: 0.3 10*3/uL (ref 0.0–0.7)
Eosinophils Relative: 3 %
HCT: 37.7 % — ABNORMAL LOW (ref 39.0–52.0)
Hemoglobin: 12 g/dL — ABNORMAL LOW (ref 13.0–17.0)
Lymphocytes Relative: 24 %
Lymphs Abs: 2.2 10*3/uL (ref 0.7–4.0)
MCH: 28.2 pg (ref 26.0–34.0)
MCHC: 31.8 g/dL (ref 30.0–36.0)
MCV: 88.7 fL (ref 78.0–100.0)
MONO ABS: 0.8 10*3/uL (ref 0.1–1.0)
Monocytes Relative: 9 %
Neutro Abs: 5.9 10*3/uL (ref 1.7–7.7)
Neutrophils Relative %: 63 %
PLATELETS: 265 10*3/uL (ref 150–400)
RBC: 4.25 MIL/uL (ref 4.22–5.81)
RDW: 16 % — AB (ref 11.5–15.5)
WBC: 9.3 10*3/uL (ref 4.0–10.5)

## 2015-09-04 LAB — COMPREHENSIVE METABOLIC PANEL
ALT: 18 U/L (ref 17–63)
AST: 21 U/L (ref 15–41)
Albumin: 3.9 g/dL (ref 3.5–5.0)
Alkaline Phosphatase: 66 U/L (ref 38–126)
Anion gap: 12 (ref 5–15)
BUN: 23 mg/dL — ABNORMAL HIGH (ref 6–20)
CALCIUM: 9.6 mg/dL (ref 8.9–10.3)
CHLORIDE: 103 mmol/L (ref 101–111)
CO2: 23 mmol/L (ref 22–32)
CREATININE: 1.37 mg/dL — AB (ref 0.61–1.24)
GFR, EST AFRICAN AMERICAN: 55 mL/min — AB (ref >60)
GFR, EST NON AFRICAN AMERICAN: 47 mL/min — AB (ref >60)
Glucose, Bld: 117 mg/dL — ABNORMAL HIGH (ref 65–99)
Potassium: 4.3 mmol/L (ref 3.5–5.1)
Sodium: 138 mmol/L (ref 135–145)
Total Bilirubin: 1.1 mg/dL (ref 0.3–1.2)
Total Protein: 7.4 g/dL (ref 6.5–8.1)

## 2015-09-04 LAB — TROPONIN I

## 2015-09-04 LAB — BRAIN NATRIURETIC PEPTIDE: B NATRIURETIC PEPTIDE 5: 242.8 pg/mL — AB (ref 0.0–100.0)

## 2015-09-04 LAB — I-STAT TROPONIN, ED: TROPONIN I, POC: 0 ng/mL (ref 0.00–0.08)

## 2015-09-04 LAB — D-DIMER, QUANTITATIVE (NOT AT ARMC): D DIMER QUANT: 1.79 ug{FEU}/mL — AB (ref 0.00–0.50)

## 2015-09-04 MED ORDER — SODIUM CHLORIDE 0.9% FLUSH
3.0000 mL | Freq: Two times a day (BID) | INTRAVENOUS | Status: DC
  Administered 2015-09-04 – 2015-09-08 (×7): 3 mL via INTRAVENOUS

## 2015-09-04 MED ORDER — SODIUM CHLORIDE 0.9% FLUSH: 3.0000 mL | INTRAVENOUS | Status: DC | PRN

## 2015-09-04 MED ORDER — VITAMIN B-12 1000 MCG PO TABS
500.0000 ug | ORAL_TABLET | Freq: Every day | ORAL | Status: DC
  Administered 2015-09-05 – 2015-09-08 (×4): 500 ug via ORAL
  Filled 2015-09-04 (×5): qty 1

## 2015-09-04 MED ORDER — CARVEDILOL 12.5 MG PO TABS
12.5000 mg | ORAL_TABLET | Freq: Two times a day (BID) | ORAL | Status: DC
  Administered 2015-09-04 – 2015-09-08 (×8): 12.5 mg via ORAL
  Filled 2015-09-04 (×8): qty 1

## 2015-09-04 MED ORDER — VITAMIN D 1000 UNITS PO TABS: 50000.0000 [IU] | ORAL_TABLET | ORAL | Status: DC

## 2015-09-04 MED ORDER — ENOXAPARIN SODIUM 40 MG/0.4ML ~~LOC~~ SOLN
40.0000 mg | SUBCUTANEOUS | Status: DC
Start: 2015-09-04 — End: 2015-09-07
  Administered 2015-09-04 – 2015-09-06 (×3): 40 mg via SUBCUTANEOUS
  Filled 2015-09-04 (×3): qty 0.4

## 2015-09-04 MED ORDER — SODIUM CHLORIDE 0.9 % IV SOLN: 250.0000 mL | INTRAVENOUS | Status: DC | PRN

## 2015-09-04 MED ORDER — IOPAMIDOL (ISOVUE-370) INJECTION 76%
INTRAVENOUS | Status: AC
  Administered 2015-09-04: 80 mL
  Filled 2015-09-04: qty 100

## 2015-09-04 MED ORDER — ACETAMINOPHEN 325 MG PO TABS
650.0000 mg | ORAL_TABLET | ORAL | Status: DC | PRN
  Administered 2015-09-05: 650 mg via ORAL
  Filled 2015-09-04: qty 2

## 2015-09-04 MED ORDER — ALLOPURINOL 300 MG PO TABS
300.0000 mg | ORAL_TABLET | Freq: Every day | ORAL | Status: DC
  Administered 2015-09-05 – 2015-09-08 (×4): 300 mg via ORAL
  Filled 2015-09-04 (×4): qty 1

## 2015-09-04 MED ORDER — ASPIRIN 81 MG PO CHEW
81.0000 mg | CHEWABLE_TABLET | Freq: Every day | ORAL | Status: DC
  Administered 2015-09-05 – 2015-09-08 (×4): 81 mg via ORAL
  Filled 2015-09-04 (×4): qty 1

## 2015-09-04 MED ORDER — FUROSEMIDE 10 MG/ML IJ SOLN
40.0000 mg | INTRAMUSCULAR | Status: AC
  Administered 2015-09-04: 40 mg via INTRAVENOUS
  Filled 2015-09-04: qty 4

## 2015-09-04 MED ORDER — ISOSORBIDE MONONITRATE ER 30 MG PO TB24
30.0000 mg | ORAL_TABLET | Freq: Every day | ORAL | Status: DC
  Administered 2015-09-05 – 2015-09-08 (×4): 30 mg via ORAL
  Filled 2015-09-04 (×4): qty 1

## 2015-09-04 MED ORDER — FUROSEMIDE 40 MG PO TABS
40.0000 mg | ORAL_TABLET | Freq: Every day | ORAL | Status: DC
  Administered 2015-09-05: 40 mg via ORAL
  Filled 2015-09-04: qty 1

## 2015-09-04 MED ORDER — ALLOPURINOL 300 MG PO TABS: 300.0000 mg | ORAL_TABLET | Freq: Every day | ORAL | Status: DC

## 2015-09-04 MED ORDER — LEVOTHYROXINE SODIUM 100 MCG PO TABS
100.0000 ug | ORAL_TABLET | Freq: Every day | ORAL | Status: DC
  Administered 2015-09-05 – 2015-09-08 (×4): 100 ug via ORAL
  Filled 2015-09-04 (×4): qty 1

## 2015-09-04 MED ORDER — PROSIGHT PO TABS
1.0000 | ORAL_TABLET | Freq: Two times a day (BID) | ORAL | Status: DC
Start: 2015-09-04 — End: 2015-09-08
  Administered 2015-09-04 – 2015-09-08 (×8): 1 via ORAL
  Filled 2015-09-04 (×8): qty 1

## 2015-09-04 MED ORDER — INSULIN ASPART 100 UNIT/ML ~~LOC~~ SOLN: 0.0000 [IU] | Freq: Three times a day (TID) | SUBCUTANEOUS | Status: DC

## 2015-09-04 MED ORDER — HYDRALAZINE HCL 10 MG PO TABS
10.0000 mg | ORAL_TABLET | Freq: Three times a day (TID) | ORAL | Status: DC
  Administered 2015-09-04 – 2015-09-08 (×10): 10 mg via ORAL
  Filled 2015-09-04 (×12): qty 1

## 2015-09-04 NOTE — ED Notes (Signed)
Pt placed onto monitor upon arrival to room. Pt monitored by blood pressure, pulse ox, and 5 lead. Pt placed on 2L via [place] per RN, Joni ReiningNicole. Pt is noted to have family at bedside.

## 2015-09-04 NOTE — ED Provider Notes (Signed)
CSN: [id number]     Arrival date & time 09/04/15  1352 History   First MD Initiated Contact with Patient 09/04/15 1407     Chief Complaint  Patient presents with  . Shortness of Breath     (Consider location/radiation/quality/duration/timing/severity/associated sxs/prior Treatment) HPI Comments: Patient is a 82 year old male with history of CAD, CHF, renal insufficiency who presents with 1 week of cough, shortness of breath, and lower show me edema. Patient states the shortness of breath this worse with exertion. Patient stated he tried to walk 200 feet today and was very out of breath. He has seceded orthopnea and has not been sleeping past week because of his shortness of breath. He states he is able to sleep for about 2 hours and wakes up feeling short of breath. Patient states that his cough is productive with clear sputum. Patient states he has appointment with his cardiologist in one week, but he was not able to wait. Patient denies chest pain, palpitations, abdominal pain, nausea, vomiting, dysuria. Patient denies recent long trips, recent surgeries or cancer.  Patient is a 82 y.o. male presenting with shortness of breath. The history is provided by the patient.  Shortness of Breath Associated symptoms: cough   Associated symptoms: no abdominal pain, no chest pain, no fever, no headaches, no rash, no sore throat and no vomiting     Past Medical History  Diagnosis Date  . CAD (coronary artery disease)     s/p CABG, 2003   . MI (myocardial infarction) (HCC)   . CHF (congestive heart failure) (HCC)     EF 40% by echo, 2006  . Renal insufficiency   . Hypercholesteremia   . HTN (hypertension)   . AAA (abdominal aortic aneurysm) [facility])    Past Surgical History  Procedure Laterality Date  . Nephrectomy    . Hernia repair    . Refractive surgery Left    Family History  Problem Relation Age of Onset  . Family history unknown: Yes   Social History  Substance Use Topics  .  Smoking status: Former Smoker -- 1.00 packs/day for 40 years    Types: Cigarettes    Quit date: 04/25/1995  . Smokeless tobacco: None  . Alcohol Use: No    Review of Systems  Constitutional: Negative for fever and chills.  HENT: Negative for facial swelling and sore throat.   Respiratory: Positive for cough and shortness of breath.   Cardiovascular: Positive for leg swelling. Negative for chest pain and palpitations.  Gastrointestinal: Negative for nausea, vomiting and abdominal pain.  Genitourinary: Negative for dysuria.  Musculoskeletal: Negative for back pain.  Skin: Negative for rash and wound.  Neurological: Negative for headaches.  Psychiatric/Behavioral: The patient is not nervous/anxious.       Allergies  Atorvastatin; Crestor; Lopid; and Penicillins  Home Medications   Prior to Admission medications   Medication Sig Start Date End Date Taking? Authorizing Provider  ACCU-CHEK AVIVA test strip 1 each by Other route as needed for other.  07/17/11   Historical Provider, MD  aspirin 81 MG tablet Take 1 tablet (81 mg total) by mouth daily. 06/23/13   Vesta Mixer, MD  carvedilol (COREG) 12.5 MG tablet Take 1 tablet (12.5 mg total) by mouth 2 (two) times daily. 08/31/15   Vesta Mixer, MD  Cholecalciferol (VITAMIN D PO) Take 50,000 Units by mouth once a week.     Historical Provider, MD  cyanocobalamin 500 MCG tablet Take 500 mcg by mouth daily.  Historical Provider, MD  glimepiride (AMARYL) 1 MG tablet Take 1 mg by mouth daily.  07/08/12   Historical Provider, MD  hydrALAZINE (APRESOLINE) 10 MG tablet Take 1 tablet (10 mg total) by mouth 3 (three) times daily. 07/28/15   Vesta Mixer, MD  isosorbide mononitrate (IMDUR) 30 MG 24 hr tablet Take 1 tablet (30 mg total) by mouth daily. 03/26/15   Vesta Mixer, MD  metFORMIN (GLUCOPHAGE) 500 MG tablet Take 500 mg by mouth 2 (two) times daily.     Historical Provider, MD   BP 155/75 mmHg  Pulse 54  Temp(Src) 97.4 F (36.3  C) (Oral)  Resp 21  SpO2 96% Physical Exam  Constitutional: He appears well-developed and well-nourished. No distress.  HENT:  Head: Normocephalic and atraumatic.  Mouth/Throat: Oropharynx is clear and moist. No oropharyngeal exudate.  Eyes: Conjunctivae are normal. Pupils are equal, round, and reactive to light. Right eye exhibits no discharge. Left eye exhibits no discharge. No scleral icterus.  Neck: Normal range of motion. Neck supple. No thyromegaly present.  Cardiovascular: Normal rate, normal heart sounds and intact distal pulses.  An irregularly irregular rhythm present. Exam reveals no gallop, no friction rub and no decreased pulses.   No murmur heard. Pulmonary/Chest: No stridor. Tachypnea noted. No respiratory distress. He has no decreased breath sounds. He has no wheezes. He has no rhonchi. He has no rales.  Increased work of breathing, patient leaning forward in the bed  Abdominal: Soft. Bowel sounds are normal. He exhibits no distension. There is no tenderness. There is no rebound and no guarding.  Musculoskeletal: He exhibits no edema (pitting edema to BL LEs).  Lymphadenopathy:    He has no cervical adenopathy.  Neurological: He is alert. Coordination normal.  Skin: Skin is warm and dry. No rash noted. He is not diaphoretic. No pallor.  Psychiatric: He has a normal mood and affect.  Nursing note and vitals reviewed.   ED Course  Procedures (including critical care time) Labs Review Labs Reviewed  COMPREHENSIVE METABOLIC PANEL - Abnormal; Notable for the following:    Glucose, Bld 117 (*)    BUN 23 (*)    Creatinine, Ser 1.37 (*)    GFR calc non Af Amer 47 (*)    GFR calc Af Amer 55 (*)    All other components within normal limits  BRAIN NATRIURETIC PEPTIDE - Abnormal; Notable for the following:    B Natriuretic Peptide 242.8 (*)    All other components within normal limits  CBC WITH DIFFERENTIAL/PLATELET - Abnormal; Notable for the following:    Hemoglobin 12.0  (*)    HCT 37.7 (*)    RDW 16.0 (*)    All other components within normal limits  D-DIMER, QUANTITATIVE (NOT AT [facility]) - Abnormal; Notable for the following:    D-Dimer, Quant 1.79 (*)    All other components within normal limits  I-STAT TROPOININ, ED    Imaging Review Dg Chest 2 View  09/04/2015  CLINICAL DATA:  SOB, congestion, cough X 1 wk Hx: HTN, diabetes, ex-smoker, heart bypass surgery EXAM: CHEST  2 VIEW COMPARISON:  None available FINDINGS: Status post median sternotomy and CABG. The heart is mildly enlarged. There is prominence of interstitial markings throughout the lungs bilaterally. Small effusions are present. No focal consolidations or alveolar opacities are identified. Mid thoracic spondylosis noted. Surgical clips are noted in the upper abdomen. IMPRESSION: Cardiomegaly. Interstitial prominence favors interstitial edema. Interstitial lung disease could have a similar appearance. Electronically  Signed   By: Norva PavlovElizabeth  Brown M.D.   On: 09/04/2015 16:08   I have personally reviewed and evaluated these images and lab results as part of my medical decision-making.   EKG Interpretation None       MDM   EKG shows possible atrial fibrillation. CXR shows cardiomegaly, interstitial prominence wavering interstitial edema or interstitial lung disease. CBC shows hemoglobin 12. CMP shows stable elevated BUN/creatinine, 23 and 1.37. Troponin 0.00. BNP 242.8. D-dimer 1.79. CTA ordered and pending. At shift change, patient care transferred to Sharilyn SitesLisa Sanders, PA-C for continued evaluation, follow up of CTA and determination of disposition. Anticipate admission.   Final diagnoses:  Dyspnea  Peripheral edema       Emi Holeslexandra M Dolph Tavano, PA-C 09/04/15 1643  Loren Raceravid Yelverton, MD 09/05/15 [phone]

## 2015-09-04 NOTE — H&P (Signed)
History and Physical  Elijah PontiffHoward L Saunders WGN:[id number]:[id number] DOB: 09/23/1933 DOA: 09/04/2015  PCP:  Aida PufferLITTLE,[name], MD   Chief Complaint:  Dyspnea  History of Present Illness:  Patient is a 82 yo male with history of congestive heart failure, CAD s/p CABG, left nephrectomy, HTN, who came with cc of dyspnea. Dyspnea started about a week ago, with mild exertion associated with PND, orthopnea and LE edema. Patient denies chest pain. He has mild cough productive of clear sputum. No fever, or chills. No N/V/D/C/abd pain/dysuria.   Review of Systems:  CONSTITUTIONAL:     No night sweats.  No fatigue.  No fever. No chills. Eyes:                            No visual changes.  No eye pain.  No eye discharge.   ENT:                              No epistaxis.  No sinus pain.  No sore throat.   No congestion. RESPIRATORY:           +cough.  No wheeze.  No hemoptysis.  +dyspnea CARDIOVASCULAR   :  No chest pains.  No palpitations. GASTROINTESTINAL:  No abdominal pain.  No nausea. No vomiting.  No diarrhea. No  constipation.  No hematemesis.  No hematochezia.  No melena. GENITOURINARY:      No urgency.  No frequency.  No dysuria.  No hematuria.  No obstructive symptoms.  No discharge.  No pain.  MUSCULOSKELETAL:  No musculoskeletal pain.  No joint swelling.  No arthritis. NEUROLOGICAL:        No confusion.  No weakness. No headache. No seizure. PSYCHIATRIC:             No depression. No anxiety. No suicidal ideation. SKIN:                             No rashes.  No lesions.  No wounds. ENDOCRINE:                No weight loss.  No polydipsia.  No polyuria.  No polyphagia. HEMATOLOGIC:           No purpura.  No petechiae.  No bleeding.  ALLERGIC                 : No pruritus.  No angioedema Other:  Past Medical and Surgical History:   Past Medical History  Diagnosis Date  . CAD (coronary artery disease)     s/p CABG, 2003   . MI (myocardial infarction) (HCC)   . CHF (congestive heart failure)  (HCC)     EF 40% by echo, 2006  . Renal insufficiency   . Hypercholesteremia   . HTN (hypertension)   . AAA (abdominal aortic aneurysm) [facility](HCC)    Past Surgical History  Procedure Laterality Date  . Nephrectomy    . Hernia repair    . Refractive surgery Left     Social History:   reports that he quit smoking about 20 years ago. His smoking use included Cigarettes. He has a 40 pack-year smoking history. He does not have any smokeless tobacco history on file. He reports that he does not drink alcohol or use illicit drugs.    Allergies  Allergen Reactions  . Atorvastatin Other (See Comments)  Intolerant to many statins  . Crestor [Rosuvastatin Calcium] Other (See Comments)    Muscle pain  . Lopid [Gemfibrozil] Other (See Comments)    unknown  . Penicillins Other (See Comments)    unknown    Family History  Problem Relation Age of Onset  . Family history unknown: Yes      Prior to Admission medications   Medication Sig Start Date End Date Taking? Authorizing Provider  allopurinol (ZYLOPRIM) 300 MG tablet Take 300 mg by mouth daily. 08/23/15  Yes Historical Provider, MD  aspirin 81 MG tablet Take 1 tablet (81 mg total) by mouth daily. 06/23/13  Yes Vesta Mixer, MD  carvedilol (COREG) 12.5 MG tablet Take 1 tablet (12.5 mg total) by mouth 2 (two) times daily. 08/31/15  Yes Vesta Mixer, MD  Cholecalciferol (VITAMIN D PO) Take 50,000 Units by mouth once a week.    Yes Historical Provider, MD  cyanocobalamin 500 MCG tablet Take 500 mcg by mouth daily.    Yes Historical Provider, MD  glimepiride (AMARYL) 1 MG tablet Take 1 mg by mouth daily.  07/08/12  Yes Historical Provider, MD  hydrALAZINE (APRESOLINE) 10 MG tablet Take 1 tablet (10 mg total) by mouth 3 (three) times daily. 07/28/15  Yes Vesta Mixer, MD  isosorbide mononitrate (IMDUR) 30 MG 24 hr tablet Take 1 tablet (30 mg total) by mouth daily. 03/26/15  Yes Vesta Mixer, MD  levothyroxine (SYNTHROID, LEVOTHROID) 100 MCG  tablet Take 100 mcg by mouth daily before breakfast. 08/23/15  Yes Historical Provider, MD  metFORMIN (GLUCOPHAGE) 500 MG tablet Take 500 mg by mouth 2 (two) times daily.    Yes Historical Provider, MD  Multiple Vitamins-Minerals (PRESERVISION AREDS 2) CAPS Take 1 capsule by mouth 2 (two) times daily.   Yes Historical Provider, MD    Physical Exam: BP 140/98 mmHg  Pulse 91  Temp(Src) 97.4 F (36.3 C) (Oral)  Resp 31  SpO2 93%  GENERAL :   Alert and cooperative, and appears to be in no acute distress. HEAD:           normocephalic. EYES:            PERRL, EOMI.  vision is grossly intact. EARS:           hearing grossly intact. NOSE:           No nasal discharge. THROAT:     Oral cavity and pharynx normal.   NECK:          supple CARDIAC:    Normal S1 and S2. No gallop. No murmurs. Irregular.  Vascular:     + peripheral edema with varicose veins.  LUNGS:       Clear to auscultation  ABDOMEN: Positive bowel sounds. Soft, nondistended, nontender. No guarding or rebound.      MSK:           No joint erythema or tenderness. Normal muscular development. EXT           : No significant deformity or joint abnormality. Neuro        : Alert, oriented to person, place, and time.                      CN II-XII intact.  SKIN:            No rash. No lesions. PSYCH:       No hallucination. Patient is not suicidal.  Labs on Admission:  Reviewed.   Radiological Exams on Admission: Dg Chest 2 View  09/04/2015  CLINICAL DATA:  SOB, congestion, cough X 1 wk Hx: HTN, diabetes, ex-smoker, heart bypass surgery EXAM: CHEST  2 VIEW COMPARISON:  None available FINDINGS: Status post median sternotomy and CABG. The heart is mildly enlarged. There is prominence of interstitial markings throughout the lungs bilaterally. Small effusions are present. No focal consolidations or alveolar opacities are identified. Mid thoracic spondylosis noted. Surgical clips are noted in the upper abdomen. IMPRESSION:  Cardiomegaly. Interstitial prominence favors interstitial edema. Interstitial lung disease could have a similar appearance. Electronically Signed   By: Norva Pavlov M.D.   On: 09/04/2015 16:08   Ct Angio Chest Pe W/cm &/or Wo Cm  09/04/2015  CLINICAL DATA:  SOB X 1 WEEK, PT. HAS 1 KIDNEY, METFORMIN FOR DIABETES, GFR=47, DR. ENTRIKIN APPROVED REDUCED DOSE FOR CT ANGIO CHEST, ORDERING PHYSICIAN AWARE OF RENAL FUNCTIONS AND PLANS TO HYDRATE, 75 ML ISOVUE 370, ELEVATED D-DIMER, LW EXAM: CT ANGIOGRAPHY CHEST WITH CONTRAST TECHNIQUE: Multidetector CT imaging of the chest was performed using the standard protocol during bolus administration of intravenous contrast. Multiplanar CT image reconstructions and MIPs were obtained to evaluate the vascular anatomy. CONTRAST:  75 cc Isovue 370 COMPARISON:  Chest x-ray 09/04/2015 FINDINGS: Heart: Extensive coronary artery calcifications are present. Heart is mildly enlarged. No pericardial effusion. Vascular structures: Ascending aortic arch is 4.1 cm. Aorta is tortuous as are the great vessels. The pulmonary arteries are well opacified. No acute pulmonary embolus identified. Mediastinum/thyroid: Small but numerous mediastinal lymph nodes are identified. These measure 1 cm in short axis and smaller. No significant hilar lymphadenopathy. Lungs/Airways: There are bilateral pleural effusions. Emphysematous changes are identified primarily at the apices. There are areas of air trapping. Smooth septal thickening is noted, most consistent with pulmonary edema. No suspicious pulmonary nodules are identified. Upper abdomen: Suspect colonic diverticula. Visualized portions of the adrenal glands are normal. Chest wall/osseous structures: Status post median sternotomy an CABG. Midthoracic spondylosis. No suspicious lytic or blastic lesions are identified. Review of the MIP images confirms the above findings. IMPRESSION: 1. Technically adequate exam showing no acute pulmonary embolus. 2.  Coronary artery disease.  Mild cardiomegaly. 3. Ascending thoracic aorta is 4.1 cm. Recommend annual imaging followup by CTA or MRA. This recommendation follows 2010 ACCF/AHA/AATS/ACR/ASA/SCA/SCAI/SIR/STS/SVM Guidelines for the Diagnosis and Management of Patients with Thoracic Aortic Disease. Circulation. 2010; 121: Z329-J242 4. Bilateral pleural effusions and parenchymal changes of pulmonary edema. 5. Emphysematous changes and areas of air trapping. 6. Small mediastinal lymph nodes may be reactive. 7. Suspect colonic diverticulosis in the upper abdomen. Electronically Signed   By: Norva Pavlov M.D.   On: 09/04/2015 17:44    EKG:  Independently reviewed. Atrial fibrillation   Assessment/Plan  Acute on chronic hear failure: Systolic with last Echo with 40% EF per cardiology note Ddx: non compliance with meds/diet         Arrhythmia : most likely, due to afib, will keep on tele         Valvular disease : will check Echo         ACS: ruling out ACS with serial trops. Initial trop was unremarkable.          PE: CTPE was neg.  Will check Echo Last Echo: EF 40%  Keep on tele Diuretics: given lasix 40 IV in the ER, will start 40 mg PO in am. BB: continue coreg.  ACEI/ARBS: None due to CKD. Continue  hydralzine/imdur  Monitor I/O & daily weights Fluid restriction Heart healthy diet  Atrial fibrillation Likely related to underlying hypertensive heart disease and/or CAD. Not clear if new onset, likely as no previous documentation.  EKG: reviewed with afib Echo: will check ChADSVasc:  5. Likely a candidate for [facility], but will defer to cardiology and await Echo.  Cardio consulted.  Rate controlled with coreg  Rule out MI : serial trops and EKGs.  HTN: cont home meds  DMII: stop home meds. Will start low dose correction.   CKD Cr at baseline. Avoid nephrotoxic medication                                                                 Input & Output: to be monitored Lines &  Tubes:P.IV DVT prophylaxis:  [place] enoxaparin  GI prophylaxis: None Consultants: Cardio in am Code Status: Full code  Family Communication: at bedside, updated.  Disposition Plan: Admit to tele bed.     Eston Esters M.D [facility]

## 2015-09-04 NOTE — ED Notes (Signed)
Attempted report to 3E. 

## 2015-09-04 NOTE — ED Notes (Signed)
Patient transported to CT 

## 2015-09-04 NOTE — ED Notes (Signed)
Patient transported to X-ray 

## 2015-09-04 NOTE — ED Notes (Signed)
Pt c/o 1 week history of sob, cough, BLE edema. He states SOB is worse at night and hes having to sleep sitting up because he feels he can not breathe. He denies pain, fevers. He is alert, breathing becomes labored while talking in triage

## 2015-09-04 NOTE — ED Provider Notes (Signed)
Care assumed from PA Law as shift change.  See her note for full H&P.  Briefly, 82 y.o. M here with worsening shortness of breath over the past week. He admits to nighttime orthopnea and difficult to sleeping because of this. He does have history of CHF with estimated EF of approx 40%, not currently on any diuretics.  Follows with cardiology, Dr. [name].  Workup thus far most consistent with CHF, however d-dimer is elevated.  Plan:  CTA pending.  Patient will likely need admission.  Results for orders placed or performed during the hospital encounter of 09/04/15  Comprehensive metabolic panel  Result Value Ref Range   Sodium 138 135 - 145 mmol/L   Potassium 4.3 3.5 - 5.1 mmol/L   Chloride 103 101 - 111 mmol/L   CO2 23 22 - 32 mmol/L   Glucose, Bld 117 (H) 65 - 99 mg/dL   BUN 23 (H) 6 - 20 mg/dL   Creatinine, Ser 1.61 (H) 0.61 - 1.24 mg/dL   Calcium 9.6 8.9 - 09.6 mg/dL   Total Protein 7.4 6.5 - 8.1 g/dL   Albumin 3.9 3.5 - 5.0 g/dL   AST 21 15 - 41 U/L   ALT 18 17 - 63 U/L   Alkaline Phosphatase 66 38 - 126 U/L   Total Bilirubin 1.1 0.3 - 1.2 mg/dL   GFR calc non Af Amer 47 (L) >60 mL/min   GFR calc Af Amer 55 (L) >60 mL/min   Anion gap 12 5 - 15  Brain natriuretic peptide  Result Value Ref Range   B Natriuretic Peptide 242.8 (H) 0.0 - 100.0 pg/mL  CBC with Differential  Result Value Ref Range   WBC 9.3 4.0 - 10.5 K/uL   RBC 4.25 4.22 - 5.81 MIL/uL   Hemoglobin 12.0 (L) 13.0 - 17.0 g/dL   HCT 04.5 (L) 40.9 - 81.1 %   MCV 88.7 78.0 - 100.0 fL   MCH 28.2 26.0 - 34.0 pg   MCHC 31.8 30.0 - 36.0 g/dL   RDW 91.4 (H) 78.2 - 95.6 %   Platelets 265 150 - 400 K/uL   Neutrophils Relative % 63 %   Neutro Abs 5.9 1.7 - 7.7 K/uL   Lymphocytes Relative 24 %   Lymphs Abs 2.2 0.7 - 4.0 K/uL   Monocytes Relative 9 %   Monocytes Absolute 0.8 0.1 - 1.0 K/uL   Eosinophils Relative 3 %   Eosinophils Absolute 0.3 0.0 - 0.7 K/uL   Basophils Relative 1 %   Basophils Absolute 0.1 0.0 - 0.1 K/uL   D-dimer, quantitative (not at [facility])  Result Value Ref Range   D-Dimer, Quant 1.79 (H) 0.00 - 0.50 ug/mL-FEU  I-stat troponin, ED  Result Value Ref Range   Troponin i, poc 0.00 0.00 - 0.08 ng/mL   Comment 3           Dg Chest 2 View  09/04/2015  CLINICAL DATA:  SOB, congestion, cough X 1 wk Hx: HTN, diabetes, ex-smoker, heart bypass surgery EXAM: CHEST  2 VIEW COMPARISON:  None available FINDINGS: Status post median sternotomy and CABG. The heart is mildly enlarged. There is prominence of interstitial markings throughout the lungs bilaterally. Small effusions are present. No focal consolidations or alveolar opacities are identified. Mid thoracic spondylosis noted. Surgical clips are noted in the upper abdomen. IMPRESSION: Cardiomegaly. Interstitial prominence favors interstitial edema. Interstitial lung disease could have a similar appearance. Electronically Signed   By: Norva Pavlov M.D.   On: 09/04/2015  16:08   Ct Angio Chest Pe W/cm &/or Wo Cm  09/04/2015  CLINICAL DATA:  SOB X 1 WEEK, PT. HAS 1 KIDNEY, METFORMIN FOR DIABETES, GFR=47, DR. ENTRIKIN APPROVED REDUCED DOSE FOR CT ANGIO CHEST, ORDERING PHYSICIAN AWARE OF RENAL FUNCTIONS AND PLANS TO HYDRATE, 75 ML ISOVUE 370, ELEVATED D-DIMER, LW EXAM: CT ANGIOGRAPHY CHEST WITH CONTRAST TECHNIQUE: Multidetector CT imaging of the chest was performed using the standard protocol during bolus administration of intravenous contrast. Multiplanar CT image reconstructions and MIPs were obtained to evaluate the vascular anatomy. CONTRAST:  75 cc Isovue 370 COMPARISON:  Chest x-ray 09/04/2015 FINDINGS: Heart: Extensive coronary artery calcifications are present. Heart is mildly enlarged. No pericardial effusion. Vascular structures: Ascending aortic arch is 4.1 cm. Aorta is tortuous as are the great vessels. The pulmonary arteries are well opacified. No acute pulmonary embolus identified. Mediastinum/thyroid: Small but numerous mediastinal lymph nodes are  identified. These measure 1 cm in short axis and smaller. No significant hilar lymphadenopathy. Lungs/Airways: There are bilateral pleural effusions. Emphysematous changes are identified primarily at the apices. There are areas of air trapping. Smooth septal thickening is noted, most consistent with pulmonary edema. No suspicious pulmonary nodules are identified. Upper abdomen: Suspect colonic diverticula. Visualized portions of the adrenal glands are normal. Chest wall/osseous structures: Status post median sternotomy an CABG. Midthoracic spondylosis. No suspicious lytic or blastic lesions are identified. Review of the MIP images confirms the above findings. IMPRESSION: 1. Technically adequate exam showing no acute pulmonary embolus. 2. Coronary artery disease.  Mild cardiomegaly. 3. Ascending thoracic aorta is 4.1 cm. Recommend annual imaging followup by CTA or MRA. This recommendation follows 2010 ACCF/AHA/AATS/ACR/ASA/SCA/SCAI/SIR/STS/SVM Guidelines for the Diagnosis and Management of Patients with Thoracic Aortic Disease. Circulation. 2010; 121: [id number] 4. Bilateral pleural effusions and parenchymal changes of pulmonary edema. 5. Emphysematous changes and areas of air trapping. 6. Small mediastinal lymph nodes may be reactive. 7. Suspect colonic diverticulosis in the upper abdomen. Electronically Signed   By: Norva PavlovElizabeth  Brown M.D.   On: 09/04/2015 17:44   CTA negative for PE, are areas of vascular congestion/edema.  Patient is still requiring 2L supplemental oxygen to keep saturations > 93%.  He will need admission and diuresis.  Will need to monitor renal function closely given his prior nephrectomy. Patient given 40mg  of IV Lasix. Patient admitted to tele bed, Dr. Mickle MalloryHamad.  Temp admit orders placed.  VS remain stable currently.  Garlon HatchetLisa M Danilynn Jemison, PA-C 09/04/15 1853  Melene Planan Floyd, DO 09/04/15 1858

## 2015-09-05 ENCOUNTER — Inpatient Hospital Stay (HOSPITAL_COMMUNITY): Payer: Medicare Other

## 2015-09-05 DIAGNOSIS — R9431 Abnormal electrocardiogram [ECG] [EKG]: Secondary | ICD-10-CM

## 2015-09-05 DIAGNOSIS — I5033 Acute on chronic diastolic (congestive) heart failure: Secondary | ICD-10-CM

## 2015-09-05 LAB — URINALYSIS, ROUTINE W REFLEX MICROSCOPIC
Bilirubin Urine: NEGATIVE
Glucose, UA: NEGATIVE mg/dL
Hgb urine dipstick: NEGATIVE
Ketones, ur: NEGATIVE mg/dL
Leukocytes, UA: NEGATIVE
Nitrite: NEGATIVE
Protein, ur: NEGATIVE mg/dL
Specific Gravity, Urine: 1.012 (ref 1.005–1.030)
pH: 5 (ref 5.0–8.0)

## 2015-09-05 LAB — TROPONIN I: Troponin I: 0.03 ng/mL (ref <0.031)

## 2015-09-05 LAB — BASIC METABOLIC PANEL
ANION GAP: 14 (ref 5–15)
BUN: 22 mg/dL — ABNORMAL HIGH (ref 6–20)
CO2: 26 mmol/L (ref 22–32)
CREATININE: 1.36 mg/dL — AB (ref 0.61–1.24)
Calcium: 9.7 mg/dL (ref 8.9–10.3)
Chloride: 101 mmol/L (ref 101–111)
GFR, EST AFRICAN AMERICAN: 55 mL/min — AB (ref >60)
GFR, EST NON AFRICAN AMERICAN: 48 mL/min — AB (ref >60)
Glucose, Bld: 122 mg/dL — ABNORMAL HIGH (ref 65–99)
Potassium: 4 mmol/L (ref 3.5–5.1)
Sodium: 141 mmol/L (ref 135–145)

## 2015-09-05 LAB — ECHOCARDIOGRAM COMPLETE
HEIGHTINCHES: 68 in
Weight: 2720 oz

## 2015-09-05 LAB — GLUCOSE, CAPILLARY
GLUCOSE-CAPILLARY: 116 mg/dL — AB (ref 65–99)
GLUCOSE-CAPILLARY: 127 mg/dL — AB (ref 65–99)
Glucose-Capillary: 119 mg/dL — ABNORMAL HIGH (ref 65–99)
Glucose-Capillary: 121 mg/dL — ABNORMAL HIGH (ref 65–99)

## 2015-09-05 MED ORDER — INSULIN ASPART 100 UNIT/ML ~~LOC~~ SOLN
0.0000 [IU] | Freq: Three times a day (TID) | SUBCUTANEOUS | Status: DC
  Administered 2015-09-05 – 2015-09-08 (×4): 1 [IU] via SUBCUTANEOUS

## 2015-09-05 MED ORDER — FUROSEMIDE 10 MG/ML IJ SOLN
40.0000 mg | Freq: Every day | INTRAMUSCULAR | Status: DC
  Administered 2015-09-05 – 2015-09-06 (×2): 40 mg via INTRAVENOUS
  Filled 2015-09-05: qty 4

## 2015-09-05 MED ORDER — FUROSEMIDE 10 MG/ML IJ SOLN
INTRAMUSCULAR | Status: AC
  Filled 2015-09-05: qty 4

## 2015-09-05 NOTE — Progress Notes (Signed)
PROGRESS NOTE    Elijah Saunders  ZOX:[id number] DOB: 1933/06/10 DOA: 09/04/2015 PCP: Aida Puffer, MD (Confirm with patient/family/NH records and if not entered, this HAS to be entered at [facility] point of entry. "No PCP" if truly none.) Outpatient Specialists: Consulting civil engineer speciality and name if known)    Brief Narrative: (Start on day 1 of progress note - keep it brief and live) Patient admitted with signs of volume overlaod.   Assessment & Plan:   Active Problems:   CHF (congestive heart failure) (HCC)   Heart failure (HCC)   1. Cardiovascular. Will continue diuresis with furosemide, will use IV furosemide due to reduced GFR, will continue carvedilol and isosorbide for blood pressure control. Antiplatelet therapy with asa. EKG personally reviewed with first degree av block and positive pvc. Will follow on echocardiogram.  Urine output 2100 last 24 hours.   2. Pulmonary. Chest film personally reviewed noted rotation to the right with hypo-inflation, increase Insterstitial markings and bilateral small pleural effusions, suggestive pulmonary edema. Will follow on oxymetry and  Continue diuresis, supplemental 02 per [place] to target 02 sat above 92%.  3. Nephrology. Patient has solitary kidney, cr at 1.3 with baseline 1.5 (personally reviewed old records), will continue diuresis with IV furosemide and follow on renal pane in am.   4. Endocrinology. Will continue insulin sliding scale for glucose cover. Serum glucose 117. Will continue levothyroxine, will follow tsh with reflex t4 in am.   5. dvt px  Patient at moderate risk for worsening heart failure decompensation.   DVT prophylaxis: heparin Code Status: (Full/Partial - specify details) Family Communication: (Specify name, relationship & date discussed. NO "discussed with patient") Disposition Plan: (specify when and where you expect patient to be discharged)   Consultants:     Procedures: (Don't include imaging studies which can be  auto populated. Include things that cannot be auto populated i.e. Echo, Carotid and venous dopplers, Foley, Bipap, HD, tubes/drains, wound vac, central lines etc)    Antimicrobials: (specify start and planned stop date. Auto populated tables are space occupying and do not give end dates)     Subjective: Dyspnea improved but still persistent, worse with exertion, no improving factors. Positive lower. Ext edema, progressive over last 7 days. No chest pain or palpitations.  Objective: Filed Vitals:   09/04/15 2005 09/05/15 0451 09/05/15 0500 09/05/15 1039  BP: 139/79 139/86  132/71  Pulse: 51 57    Temp: 97.7 F (36.5 C) 97.1 F (36.2 C)    TempSrc: Oral Oral    Resp: 26 20    Height: 5\' 8"  (1.727 m)     Weight: 79.379 kg (175 lb)  77.111 kg (170 lb)   SpO2: 94% 93%      Intake/Output Summary (Last 24 hours) at 09/05/15 1201 Last data filed at 09/05/15 1041  Gross per 24 hour  Intake    360 ml  Output   2300 ml  Net  -1940 ml   Filed Weights   09/04/15 2005 09/05/15 0500  Weight: 79.379 kg (175 lb) 77.111 kg (170 lb)    Examination:  General exam:  Deconditioned. E ENT: mild conjunctival pallor, oral mucosa moist. Respiratory system: mild rales at bases with poor inspiratory effort. No wheezing or rhonchi. Cardiovascular system: S1 & S2 heard, RRR. Mild to moderate JVD, murmurs, rubs, gallops or clicks. ++ pitting edema, symmetric and bilateral. Gastrointestinal system: Abdomen is nondistended, soft and nontender. No organomegaly or masses felt. Normal bowel sounds heard. Central nervous system: Alert and  oriented. No focal neurological deficits. Extremities: Symmetric 5 x 5 power. Skin: No rashes, lesions or ulcers .     Data Reviewed: I have personally reviewed following labs and imaging studies  CBC:  Recent Labs Lab 09/04/15 1430  WBC 9.3  NEUTROABS 5.9  HGB 12.0*  HCT 37.7*  MCV 88.7  PLT 265   Basic Metabolic Panel:  Recent Labs Lab  09/04/15 1430 09/05/15 0346  NA 138 141  K 4.3 4.0  CL 103 101  CO2 23 26  GLUCOSE 117* 122*  BUN 23* 22*  CREATININE 1.37* 1.36*  CALCIUM 9.6 9.7   GFR: Estimated Creatinine Clearance: 42.6 mL/min (by C-G formula based on Cr of 1.36). Liver Function Tests:  Recent Labs Lab 09/04/15 1430  AST 21  ALT 18  ALKPHOS 66  BILITOT 1.1  PROT 7.4  ALBUMIN 3.9   No results for input(s): LIPASE, AMYLASE in the last 168 hours. No results for input(s): AMMONIA in the last 168 hours. Coagulation Profile: No results for input(s): INR, PROTIME in the last 168 hours. Cardiac Enzymes:  Recent Labs Lab 09/04/15 2041 09/05/15 0346  TROPONINI <0.03 0.03   BNP (last 3 results) No results for input(s): PROBNP in the last 8760 hours. HbA1C: No results for input(s): HGBA1C in the last 72 hours. CBG:  Recent Labs Lab 09/05/15 0612 09/05/15 1145  GLUCAP 119* 116*   Lipid Profile: No results for input(s): CHOL, HDL, LDLCALC, TRIG, CHOLHDL, LDLDIRECT in the last 72 hours. Thyroid Function Tests: No results for input(s): TSH, T4TOTAL, FREET4, T3FREE, THYROIDAB in the last 72 hours. Anemia Panel: No results for input(s): VITAMINB12, FOLATE, FERRITIN, TIBC, IRON, RETICCTPCT in the last 72 hours. Urine analysis:    Component Value Date/Time   COLORURINE YELLOW 09/04/2015 2350   APPEARANCEUR CLEAR 09/04/2015 2350   LABSPEC 1.012 09/04/2015 2350   PHURINE 5.0 09/04/2015 2350   GLUCOSEU NEGATIVE 09/04/2015 2350   HGBUR NEGATIVE 09/04/2015 2350   BILIRUBINUR NEGATIVE 09/04/2015 2350   KETONESUR NEGATIVE 09/04/2015 2350   PROTEINUR NEGATIVE 09/04/2015 2350   NITRITE NEGATIVE 09/04/2015 2350   LEUKOCYTESUR NEGATIVE 09/04/2015 2350   Sepsis Labs: No results for input(s): PROCALCITON, LATICACIDVEN in the last 168 hours.  No results found for this or any previous visit (from the past 240 hour(s)).       Radiology Studies: Dg Chest 2 View  09/04/2015  CLINICAL DATA:  SOB,  congestion, cough X 1 wk Hx: HTN, diabetes, ex-smoker, heart bypass surgery EXAM: CHEST  2 VIEW COMPARISON:  None available FINDINGS: Status post median sternotomy and CABG. The heart is mildly enlarged. There is prominence of interstitial markings throughout the lungs bilaterally. Small effusions are present. No focal consolidations or alveolar opacities are identified. Mid thoracic spondylosis noted. Surgical clips are noted in the upper abdomen. IMPRESSION: Cardiomegaly. Interstitial prominence favors interstitial edema. Interstitial lung disease could have a similar appearance. Electronically Signed   By: Norva Pavlov M.D.   On: 09/04/2015 16:08   Ct Angio Chest Pe W/cm &/or Wo Cm  09/04/2015  CLINICAL DATA:  SOB X 1 WEEK, PT. HAS 1 KIDNEY, METFORMIN FOR DIABETES, GFR=47, DR. ENTRIKIN APPROVED REDUCED DOSE FOR CT ANGIO CHEST, ORDERING PHYSICIAN AWARE OF RENAL FUNCTIONS AND PLANS TO HYDRATE, 75 ML ISOVUE 370, ELEVATED D-DIMER, LW EXAM: CT ANGIOGRAPHY CHEST WITH CONTRAST TECHNIQUE: Multidetector CT imaging of the chest was performed using the standard protocol during bolus administration of intravenous contrast. Multiplanar CT image reconstructions and MIPs were obtained to evaluate  the vascular anatomy. CONTRAST:  75 cc Isovue 370 COMPARISON:  Chest x-ray 09/04/2015 FINDINGS: Heart: Extensive coronary artery calcifications are present. Heart is mildly enlarged. No pericardial effusion. Vascular structures: Ascending aortic arch is 4.1 cm. Aorta is tortuous as are the great vessels. The pulmonary arteries are well opacified. No acute pulmonary embolus identified. Mediastinum/thyroid: Small but numerous mediastinal lymph nodes are identified. These measure 1 cm in short axis and smaller. No significant hilar lymphadenopathy. Lungs/Airways: There are bilateral pleural effusions. Emphysematous changes are identified primarily at the apices. There are areas of air trapping. Smooth septal thickening is noted,  most consistent with pulmonary edema. No suspicious pulmonary nodules are identified. Upper abdomen: Suspect colonic diverticula. Visualized portions of the adrenal glands are normal. Chest wall/osseous structures: Status post median sternotomy an CABG. Midthoracic spondylosis. No suspicious lytic or blastic lesions are identified. Review of the MIP images confirms the above findings. IMPRESSION: 1. Technically adequate exam showing no acute pulmonary embolus. 2. Coronary artery disease.  Mild cardiomegaly. 3. Ascending thoracic aorta is 4.1 cm. Recommend annual imaging followup by CTA or MRA. This recommendation follows 2010 ACCF/AHA/AATS/ACR/ASA/SCA/SCAI/SIR/STS/SVM Guidelines for the Diagnosis and Management of Patients with Thoracic Aortic Disease. Circulation. 2010; 121: Z610-[id number] 4. Bilateral pleural effusions and parenchymal changes of pulmonary edema. 5. Emphysematous changes and areas of air trapping. 6. Small mediastinal lymph nodes may be reactive. 7. Suspect colonic diverticulosis in the upper abdomen. Electronically Signed   By: Norva PavlovElizabeth  Brown M.D.   On: 09/04/2015 17:44        Scheduled Meds: . allopurinol  300 mg Oral Daily  . aspirin  81 mg Oral Daily  . carvedilol  12.5 mg Oral BID  . cholecalciferol  50,000 Units Oral Weekly  . cyanocobalamin  500 mcg Oral Daily  . enoxaparin (LOVENOX) injection  40 mg Subcutaneous Q24H  . furosemide  40 mg Intravenous Daily  . hydrALAZINE  10 mg Oral TID  . insulin aspart  0-9 Units Subcutaneous TID WC  . isosorbide mononitrate  30 mg Oral Daily  . levothyroxine  100 mcg Oral QAC breakfast  . multivitamin  1 tablet Oral BID  . sodium chloride flush  3 mL Intravenous Q12H   Continuous Infusions:    LOS: 1 day        Keelin Sheridan Annett Gulaaniel Tenlee Wollin, MD [facility] Pager [phone]  If 7PM-7AM, please contact night-coverage [email or web address] Password [email or web address] 09/05/2015, 12:01 PM

## 2015-09-06 ENCOUNTER — Encounter (HOSPITAL_COMMUNITY): Payer: Self-pay | Admitting: Cardiology

## 2015-09-06 DIAGNOSIS — I255 Ischemic cardiomyopathy: Secondary | ICD-10-CM | POA: Diagnosis present

## 2015-09-06 DIAGNOSIS — Z789 Other specified health status: Secondary | ICD-10-CM

## 2015-09-06 DIAGNOSIS — N183 Chronic kidney disease, stage 3 unspecified: Secondary | ICD-10-CM | POA: Diagnosis present

## 2015-09-06 DIAGNOSIS — J9621 Acute and chronic respiratory failure with hypoxia: Secondary | ICD-10-CM | POA: Diagnosis present

## 2015-09-06 DIAGNOSIS — I4891 Unspecified atrial fibrillation: Secondary | ICD-10-CM

## 2015-09-06 DIAGNOSIS — E785 Hyperlipidemia, unspecified: Secondary | ICD-10-CM

## 2015-09-06 DIAGNOSIS — I4819 Other persistent atrial fibrillation: Secondary | ICD-10-CM | POA: Diagnosis present

## 2015-09-06 DIAGNOSIS — J982 Interstitial emphysema: Secondary | ICD-10-CM | POA: Diagnosis present

## 2015-09-06 DIAGNOSIS — Z951 Presence of aortocoronary bypass graft: Secondary | ICD-10-CM

## 2015-09-06 DIAGNOSIS — Z8679 Personal history of other diseases of the circulatory system: Secondary | ICD-10-CM

## 2015-09-06 DIAGNOSIS — E1129 Type 2 diabetes mellitus with other diabetic kidney complication: Secondary | ICD-10-CM | POA: Diagnosis present

## 2015-09-06 DIAGNOSIS — Z9889 Other specified postprocedural states: Secondary | ICD-10-CM

## 2015-09-06 DIAGNOSIS — I5021 Acute systolic (congestive) heart failure: Secondary | ICD-10-CM

## 2015-09-06 LAB — BASIC METABOLIC PANEL
ANION GAP: 10 (ref 5–15)
BUN: 23 mg/dL — ABNORMAL HIGH (ref 6–20)
CALCIUM: 9.5 mg/dL (ref 8.9–10.3)
CO2: 29 mmol/L (ref 22–32)
Chloride: 99 mmol/L — ABNORMAL LOW (ref 101–111)
Creatinine, Ser: 1.48 mg/dL — ABNORMAL HIGH (ref 0.61–1.24)
GFR calc Af Amer: 50 mL/min — ABNORMAL LOW (ref >60)
GFR, EST NON AFRICAN AMERICAN: 43 mL/min — AB (ref >60)
Glucose, Bld: 111 mg/dL — ABNORMAL HIGH (ref 65–99)
POTASSIUM: 3.9 mmol/L (ref 3.5–5.1)
SODIUM: 138 mmol/L (ref 135–145)

## 2015-09-06 LAB — TSH: TSH: 3.894 u[IU]/mL (ref 0.350–4.500)

## 2015-09-06 LAB — T4, FREE: Free T4: 1.2 ng/dL — ABNORMAL HIGH (ref 0.61–1.12)

## 2015-09-06 LAB — LIPID PANEL
CHOL/HDL RATIO: 6.6 ratio
CHOLESTEROL: 179 mg/dL (ref 0–200)
HDL: 27 mg/dL — AB (ref >40)
LDL Cholesterol: 125 mg/dL — ABNORMAL HIGH (ref 0–99)
TRIGLYCERIDES: 133 mg/dL (ref <150)
VLDL: 27 mg/dL (ref 0–40)

## 2015-09-06 LAB — GLUCOSE, CAPILLARY
GLUCOSE-CAPILLARY: 126 mg/dL — AB (ref 65–99)
Glucose-Capillary: 109 mg/dL — ABNORMAL HIGH (ref 65–99)
Glucose-Capillary: 92 mg/dL (ref 65–99)
Glucose-Capillary: 128 mg/dL — ABNORMAL HIGH (ref 65–99)

## 2015-09-06 NOTE — Progress Notes (Signed)
PROGRESS NOTE    Elijah PontiffHoward L Saunders  WUJ:[id number] DOB: 08/21/1933 DOA: 09/04/2015 PCP: Aida PufferLITTLE,[name], MD (Confirm with patient/family/NH records and if not entered, this HAS to be [facility] at [facility] point of entry. "No PCP" if truly none.)   Brief Narrative: (Start on day 1 of progress note - keep it brief and live) Diastolic heart failure decompensation. 82 y.o male with chronic hypertension. Has developed hypotension and renal failure after diuresis.   Assessment & Plan:   Active Problems:   CHF (congestive heart failure) (HCC)   Heart failure (HCC)   1. Cardiovascular. Echocardiogram reported reduced ejection fraction 35 to 40% (new finding), with significant apical hypokinesis as well as basal- midanterolateral and inferiorlateral walls, and mild anteroseptal hypokinesis, suggestive coronary artery disease and ischemic cardiomyopathy. Clinically patient has no angina. Will continue antiplatelet therapy with asa and b blocker with coreg. Hold on ace inh due to worsening renal function, hold on statin due to reported allergy, check lipid profile. Cardiology consulted. Continue hydralazine and isosorbide.  2. Pulmonary.  Dyspnea has improved, will continue to monitor oxymetry and supplemental 02 per [place] to target 02 sat above 92%,. Will hold on furosemide due to worsening renal function.   3. Nephrology. Solitary kidney with worsening renal function, with cr at 1.48 from 1.36, K at 3.9 and serum bicarb at 29. Will hold on diuresis and follow on renal panel in am, avoid hypotension or nephrotoxic meds.   4. Endocrinology. Insulin sliding scale for glucose cover. Serum glucose [id number]. Will continue levothyroxine, tsh and free t 4 within normal ranges.  5. dvt px  Patient at moderate risk for worsening heart failure decompensation and renal failure.  DVT prophylaxis:  lovenox Code Status: (Full/Partial - specify details) Family Communication:  Patient's wife at the  bedside. Disposition Plan: (specify when and where you expect patient to be discharged). Include barriers to DC in this tab.   Consultants:   cardiology  Procedures: (Don't include imaging studies which can be auto populated. Include things that cannot be auto populated i.e. Echo, Carotid and venous dopplers, Foley, Bipap, HD, tubes/drains, wound vac, central lines etc)    Antimicrobials: (specify start and planned stop date. Auto populated tables are space occupying and do not give end dates)     Subjective: Patient with improved dyspnea, out of bed to chair, no chest pain or palpitations. No worsening edema. Tolerating po well, no nausea or vomiting.   Objective: Filed Vitals:   09/06/15 0047 09/06/15 0612 09/06/15 0835 09/06/15 1123  BP: 109/74 124/68 104/61 111/61  Pulse: 60 38  66  Temp: 98 F (36.7 C) 97.6 F (36.4 C)  97.6 F (36.4 C)  TempSrc: Oral Oral  Oral  Resp: 21 19  18   Height:      Weight:  75.932 kg (167 lb 6.4 oz)    SpO2: 90% 98%  91%    Intake/Output Summary (Last 24 hours) at 09/06/15 1236 Last data filed at 09/06/15 1129  Gross per 24 hour  Intake   1200 ml  Output   1900 ml  Net   -700 ml   Filed Weights   09/04/15 2005 09/05/15 0500 09/06/15 0612  Weight: 79.379 kg (175 lb) 77.111 kg (170 lb) 75.932 kg (167 lb 6.4 oz)    Examination:  General exam:  Not in pain or dyspnea. E ENT: Mild conjunctival pallor, oral mucosa dry.  Respiratory system: Decreased breath sounds at bases. Respiratory effort normal. No wheezing, rales or rhonchi, vesicular breath  sounds bilaterally. Cardiovascular system: S1 & S2 heard, RRR. No JVD, murmurs, rubs, gallops or clicks. Positive trace edema, symmetric lower ext. Gastrointestinal system: Abdomen is nondistended, soft and nontender. No organomegaly or masses felt. Normal bowel sounds heard. Central nervous system: Alert and oriented. No focal neurological deficits. Extremities: Symmetric 5 x 5 power.  Positive varicose veins on the right lower ext.  Skin: No rashes, lesions or ulcers Psychiatry: Judgement and insight appear normal. Mood & affect appropriate.     Data Reviewed: I have personally reviewed following labs and imaging studies  CBC:  Recent Labs Lab 09/04/15 1430  WBC 9.3  NEUTROABS 5.9  HGB 12.0*  HCT 37.7*  MCV 88.7  PLT 265   Basic Metabolic Panel:  Recent Labs Lab 09/04/15 1430 09/05/15 0346 09/06/15 0340  NA 138 141 138  K 4.3 4.0 3.9  CL 103 101 99*  CO2 23 26 29   GLUCOSE 117* 122* 111*  BUN 23* 22* 23*  CREATININE 1.37* 1.36* 1.48*  CALCIUM 9.6 9.7 9.5   GFR: Estimated Creatinine Clearance: 39.2 mL/min (by C-G formula based on Cr of 1.48). Liver Function Tests:  Recent Labs Lab 09/04/15 1430  AST 21  ALT 18  ALKPHOS 66  BILITOT 1.1  PROT 7.4  ALBUMIN 3.9   No results for input(s): LIPASE, AMYLASE in the last 168 hours. No results for input(s): AMMONIA in the last 168 hours. Coagulation Profile: No results for input(s): INR, PROTIME in the last 168 hours. Cardiac Enzymes:  Recent Labs Lab 09/04/15 2041 09/05/15 0346  TROPONINI <0.03 0.03   BNP (last 3 results) No results for input(s): PROBNP in the last 8760 hours. HbA1C: No results for input(s): HGBA1C in the last 72 hours. CBG:  Recent Labs Lab 09/05/15 1145 09/05/15 1638 09/05/15 2109 09/06/15 0619 09/06/15 1121  GLUCAP 116* 127* 121* 109* 126*   Lipid Profile: No results for input(s): CHOL, HDL, LDLCALC, TRIG, CHOLHDL, LDLDIRECT in the last 72 hours. Thyroid Function Tests:  Recent Labs  09/06/15 0340  TSH 3.894  FREET4 1.20*   Anemia Panel: No results for input(s): VITAMINB12, FOLATE, FERRITIN, TIBC, IRON, RETICCTPCT in the last 72 hours. Sepsis Labs: No results for input(s): PROCALCITON, LATICACIDVEN in the last 168 hours.  No results found for this or any previous visit (from the past 240 hour(s)).       Radiology Studies: Dg Chest 2  View  09/04/2015  CLINICAL DATA:  SOB, congestion, cough X 1 wk Hx: HTN, diabetes, ex-smoker, heart bypass surgery EXAM: CHEST  2 VIEW COMPARISON:  None available FINDINGS: Status post median sternotomy and CABG. The heart is mildly enlarged. There is prominence of interstitial markings throughout the lungs bilaterally. Small effusions are present. No focal consolidations or alveolar opacities are identified. Mid thoracic spondylosis noted. Surgical clips are noted in the upper abdomen. IMPRESSION: Cardiomegaly. Interstitial prominence favors interstitial edema. Interstitial lung disease could have a similar appearance. Electronically Signed   By: Norva Pavlov M.D.   On: 09/04/2015 16:08   Ct Angio Chest Pe W/cm &/or Wo Cm  09/04/2015  CLINICAL DATA:  SOB X 1 WEEK, PT. HAS 1 KIDNEY, METFORMIN FOR DIABETES, GFR=47, DR. ENTRIKIN APPROVED REDUCED DOSE FOR CT ANGIO CHEST, ORDERING PHYSICIAN AWARE OF RENAL FUNCTIONS AND PLANS TO HYDRATE, 75 ML ISOVUE 370, ELEVATED D-DIMER, LW EXAM: CT ANGIOGRAPHY CHEST WITH CONTRAST TECHNIQUE: Multidetector CT imaging of the chest was performed using the standard protocol during bolus administration of intravenous contrast. Multiplanar CT image reconstructions and  MIPs were obtained to evaluate the vascular anatomy. CONTRAST:  75 cc Isovue 370 COMPARISON:  Chest x-ray 09/04/2015 FINDINGS: Heart: Extensive coronary artery calcifications are present. Heart is mildly enlarged. No pericardial effusion. Vascular structures: Ascending aortic arch is 4.1 cm. Aorta is tortuous as are the great vessels. The pulmonary arteries are well opacified. No acute pulmonary embolus identified. Mediastinum/thyroid: Small but numerous mediastinal lymph nodes are identified. These measure 1 cm in short axis and smaller. No significant hilar lymphadenopathy. Lungs/Airways: There are bilateral pleural effusions. Emphysematous changes are identified primarily at the apices. There are areas of air  trapping. Smooth septal thickening is noted, most consistent with pulmonary edema. No suspicious pulmonary nodules are identified. Upper abdomen: Suspect colonic diverticula. Visualized portions of the adrenal glands are normal. Chest wall/osseous structures: Status post median sternotomy an CABG. Midthoracic spondylosis. No suspicious lytic or blastic lesions are identified. Review of the MIP images confirms the above findings. IMPRESSION: 1. Technically adequate exam showing no acute pulmonary embolus. 2. Coronary artery disease.  Mild cardiomegaly. 3. Ascending thoracic aorta is 4.1 cm. Recommend annual imaging followup by CTA or MRA. This recommendation follows 2010 ACCF/AHA/AATS/ACR/ASA/SCA/SCAI/SIR/STS/SVM Guidelines for the Diagnosis and Management of Patients with Thoracic Aortic Disease. Circulation. 2010; 121: Z610-R604 4. Bilateral pleural effusions and parenchymal changes of pulmonary edema. 5. Emphysematous changes and areas of air trapping. 6. Small mediastinal lymph nodes may be reactive. 7. Suspect colonic diverticulosis in the upper abdomen. Electronically Signed   By: Norva Pavlov M.D.   On: 09/04/2015 17:44        Scheduled Meds: . allopurinol  300 mg Oral Daily  . aspirin  81 mg Oral Daily  . carvedilol  12.5 mg Oral BID  . cholecalciferol  50,000 Units Oral Weekly  . enoxaparin (LOVENOX) injection  40 mg Subcutaneous Q24H  . hydrALAZINE  10 mg Oral TID  . insulin aspart  0-9 Units Subcutaneous TID WC  . isosorbide mononitrate  30 mg Oral Daily  . levothyroxine  100 mcg Oral QAC breakfast  . multivitamin  1 tablet Oral BID  . sodium chloride flush  3 mL Intravenous Q12H  . vitamin B-12  500 mcg Oral Daily   Continuous Infusions:    LOS: 2 days        Ashar Lewinski Annett Gula, MD [facility] Pager [phone]  If 7PM-7AM, please contact night-coverage [email or web address] Password [email or web address] 09/06/2015, 12:36 PM

## 2015-09-06 NOTE — Consult Note (Signed)
Reason for Consult:   Acute CHF  Requesting Physician: Internal Medicine Primary Cardiologist Dr [name]  HPI:   82 y/o male followed by Dr [name] with a history of CAD-s/p CABG x 25 June 2001 followed by AAA R&G June 2003, DM, HTN, CRI-3 s/p Lt nephrectomy in the 1950's,  HLD (statin intol), and ICM- EF 40% by echo 2006.   He has done pretty well since his CABG, no CHF, no f/u cath required. He lives in [place] with his wife and has [place] acres that he gardens. He has noted increasing DOE as well as orthopnea for the past week. He actually has an appointment to see Dr [name] 5/22 for this. This weekend he went to Urgent Care as his symptoms were not getting better, they sent him to the ED 09/04/15 and he was admitted. He denies chest pain or palpitations. His CXR and CTA suggested CHF and he has been diuresed 2.4L-(8 lbs) with improvement in his symptoms. His EKG on admission and his telemetry show AF with CVR which is new for him, EKG in Aug 2016-NSR. He is unaware of the AF.  PMHx:  Past Medical History  Diagnosis Date  . CAD (coronary artery disease)     s/p CABG, 2003   . MI (myocardial infarction) (HCC)   . CHF (congestive heart failure) (HCC)     EF 35-40% by echo, May 2017  . Chronic renal insufficiency, stage III (moderate)   . Hypercholesteremia     statin intol  . HTN (hypertension)   . AAA (abdominal aortic aneurysm) [facility])     s/p AAA R&G June 2003  . SBO (small bowel obstruction) [facility])     Past Surgical History  Procedure Laterality Date  . Nephrectomy Left     1955  . Hernia repair    . Coronary artery bypass graft  March 2003    x 4  . Abdominal aortic aneurysm repair  June 2003    SOCHx:  reports that he quit smoking about 20 years ago. His smoking use included Cigarettes. He has a 40 pack-year smoking history. He does not have any smokeless tobacco history on file. He reports that he does not drink alcohol or use illicit drugs.  FAMHx: Family  History  Problem Relation Age of Onset  . Family history unknown: Yes    ALLERGIES: Allergies  Allergen Reactions  . Ace Inhibitors Other (See Comments)    Renal insuffiencey  . Atorvastatin Other (See Comments)    Intolerant to many statins  . Crestor [Rosuvastatin Calcium] Other (See Comments)    Muscle pain  . Lopid [Gemfibrozil] Other (See Comments)    unknown  . Penicillins Other (See Comments)    unknown    ROS: Review of Systems: General: negative for chills, fever, night sweats or weight changes.  Cardiovascular: negative for chest pain or palpitations HEENT: negative for any visual disturbances, blindness, glaucoma Dermatological: negative for rash Respiratory: negative for cough, hemoptysis, or wheezing Urologic: negative for hematuria or dysuria Abdominal: negative for nausea, vomiting, diarrhea, bright red blood per rectum, melena, or hematemesis Neurologic: negative for visual changes, syncope, or dizziness Musculoskeletal: negative for back pain, joint pain, or swelling Psych: cooperative and appropriate All other systems reviewed and are otherwise negative except as noted above.   HOME MEDICATIONS: Prior to Admission medications   Medication Sig Start Date End Date Taking? Authorizing Provider  allopurinol (ZYLOPRIM) 300 MG tablet Take 300 mg  by mouth daily. 08/23/15  Yes Historical Provider, MD  aspirin 81 MG tablet Take 1 tablet (81 mg total) by mouth daily. 06/23/13  Yes Vesta Mixer, MD  carvedilol (COREG) 12.5 MG tablet Take 1 tablet (12.5 mg total) by mouth 2 (two) times daily. 08/31/15  Yes Vesta Mixer, MD  Cholecalciferol (VITAMIN D PO) Take 50,000 Units by mouth once a week.    Yes Historical Provider, MD  cyanocobalamin 500 MCG tablet Take 500 mcg by mouth daily.    Yes Historical Provider, MD  glimepiride (AMARYL) 1 MG tablet Take 1 mg by mouth daily.  07/08/12  Yes Historical Provider, MD  hydrALAZINE (APRESOLINE) 10 MG tablet Take 1 tablet (10  mg total) by mouth 3 (three) times daily. 07/28/15  Yes Vesta Mixer, MD  isosorbide mononitrate (IMDUR) 30 MG 24 hr tablet Take 1 tablet (30 mg total) by mouth daily. 03/26/15  Yes Vesta Mixer, MD  levothyroxine (SYNTHROID, LEVOTHROID) 100 MCG tablet Take 100 mcg by mouth daily before breakfast. 08/23/15  Yes Historical Provider, MD  metFORMIN (GLUCOPHAGE) 500 MG tablet Take 500 mg by mouth 2 (two) times daily.    Yes Historical Provider, MD  Multiple Vitamins-Minerals (PRESERVISION AREDS 2) CAPS Take 1 capsule by mouth 2 (two) times daily.   Yes Historical Provider, MD    HOSPITAL MEDICATIONS: I have reviewed the patient's current medications.  VITALS: Blood pressure 111/61, pulse 66, temperature 97.6 F (36.4 C), temperature source Oral, resp. rate 18, height  (1.727 m), weight 167 lb 6.4 oz (75.932 kg), SpO2 91 %.  PHYSICAL EXAM: General appearance: alert, cooperative, no distress, moderately obese and on O2 Neck: no carotid bruit and no JVD Lungs: decreased breath sounds overall Heart: irregularly irregular rhythm and 2/6 systolic murmur LSB Abdomen: non tender, mid line surgical scar, ventral hernia noted Extremities: varicose veins noted and trace edema Pulses: 2+ and symmetric Skin: Skin color, texture, turgor normal. No rashes or lesions Neurologic: Grossly normal  LABS: Results for orders placed or performed during the hospital encounter of 09/04/15 (from the past 24 hour(s))  Glucose, capillary     Status: Abnormal   Collection Time: 09/05/15  4:38 PM  Result Value Ref Range   Glucose-Capillary 127 (H) 65 - 99 mg/dL  Glucose, capillary     Status: Abnormal   Collection Time: 09/05/15  9:09 PM  Result Value Ref Range   Glucose-Capillary 121 (H) 65 - 99 mg/dL  Basic metabolic panel     Status: Abnormal   Collection Time: 09/06/15  3:40 AM  Result Value Ref Range   Sodium 138 135 - 145 mmol/L   Potassium 3.9 3.5 - 5.1 mmol/L   Chloride 99 (L) 101 - 111 mmol/L    CO2 29 22 - 32 mmol/L   Glucose, Bld 111 (H) 65 - 99 mg/dL   BUN 23 (H) 6 - 20 mg/dL   Creatinine, Ser 1.61 (H) 0.61 - 1.24 mg/dL   Calcium 9.5 8.9 - 09.6 mg/dL   GFR calc non Af Amer 43 (L) >60 mL/min   GFR calc Af Amer 50 (L) >60 mL/min   Anion gap 10 5 - 15  TSH     Status: None   Collection Time: 09/06/15  3:40 AM  Result Value Ref Range   TSH 3.894 0.350 - 4.500 uIU/mL  T4, free     Status: Abnormal   Collection Time: 09/06/15  3:40 AM  Result Value Ref Range   Free  T4 1.20 (H) 0.61 - 1.12 ng/dL  Lipid panel     Status: Abnormal   Collection Time: 09/06/15  3:40 AM  Result Value Ref Range   Cholesterol 179 0 - 200 mg/dL   Triglycerides 130133 <865<150 mg/dL   HDL 27 (L) >78>40 mg/dL   Total CHOL/HDL Ratio 6.6 RATIO   VLDL 27 0 - 40 mg/dL   LDL Cholesterol 469125 (H) 0 - 99 mg/dL  Glucose, capillary     Status: Abnormal   Collection Time: 09/06/15  6:19 AM  Result Value Ref Range   Glucose-Capillary 109 (H) 65 - 99 mg/dL  Glucose, capillary     Status: Abnormal   Collection Time: 09/06/15 11:21 AM  Result Value Ref Range   Glucose-Capillary 126 (H) 65 - 99 mg/dL    EKG: AF with CVR, poor ant RW  IMAGING: Ct Angio Chest Pe W/cm &/or Wo Cm  09/04/2015   CT ANGIO CHEST,   IMPRESSION: 1. Technically adequate exam showing no acute pulmonary embolus. 2. Coronary artery disease.  Mild cardiomegaly. 3. Ascending thoracic aorta is 4.1 cm. Recommend annual imaging followup by CTA or MRA. Bilateral pleural effusions and parenchymal changes of pulmonary edema. 5. Emphysematous changes and areas of air trapping. 6. Small mediastinal lymph nodes may be reactive. 7. Suspect colonic diverticulosis in the upper abdomen. Electronically Signed   By: Norva PavlovElizabeth  Brown M.D.   On: 09/04/2015 17:44    IMPRESSION: Principal Problem:   Acute on chronic respiratory failure with hypoxia (HCC) Active Problems:   Acute systolic (congestive) heart failure (HCC)   Atrial fibrillation, new onset (HCC)   Hx of  CABG x 4 2003   Chronic renal disease, stage III   Type 2 diabetes mellitus with renal manifestations, controlled (HCC)   Cardiomyopathy, ischemic-EF 35-40%   Hyperlipidemia-statin intol   S/P AAA repair 2003   ACE inhibitor intolerance   Emphysema, interstitial (HCC)   RECOMMENDATION: CHADs VASc=6 for age, HTN, DM, vasc dis. He will need to be anticoagulated (Eliquis). I suspect this current decompensation is from AF. It sounds like he has been in it at least a week. His rate is controlled on current meds. Plan 4 weeks of anticoagulation then cardiovert. He'll probably need to go home on a diuretic. Not a candidate for Entresto secondary to prior history of worsening renal insufficieny on ACE. MD to see.  Time Spent Directly with Patient: 45 minutes  Corine ShelterLuke Kilroy, [place]  [phone] beeper 09/06/2015, 3:19 PM   I have seen and examined the patient along with Corine ShelterLuke Kilroy, PA.  I have reviewed the chart, notes and new data.  I agree with PA's note.  Key new complaints: feels better, still on O2 for dyspnea Key examination changes: no overt signs of hypervolemia, irregular rhythm, severely dilated varicose veins of the lower extremities Key new findings / data: echo reviewed  PLAN: Will explore options for TEE/DCCV after 3 doses of anticoagulant (Wednesday). If not possible, bring back for elective DCCV after 3 weeks. Unfortunately, I do not think he will feel better until restoration of NSR. He was never particularly tachycardic, so rate control is not the issue, but rather restoration of atrial "kick".  Thurmon FairMihai Iyanla Eilers, MD, [facility] Hospital[facility] [phone] 09/06/2015, 4:44 PM

## 2015-09-07 DIAGNOSIS — N189 Chronic kidney disease, unspecified: Secondary | ICD-10-CM | POA: Insufficient documentation

## 2015-09-07 DIAGNOSIS — Z888 Allergy status to other drugs, medicaments and biological substances status: Secondary | ICD-10-CM

## 2015-09-07 DIAGNOSIS — R0600 Dyspnea, unspecified: Secondary | ICD-10-CM

## 2015-09-07 DIAGNOSIS — N289 Disorder of kidney and ureter, unspecified: Secondary | ICD-10-CM

## 2015-09-07 DIAGNOSIS — T464X1A Poisoning by angiotensin-converting-enzyme inhibitors, accidental (unintentional), initial encounter: Secondary | ICD-10-CM

## 2015-09-07 DIAGNOSIS — E1122 Type 2 diabetes mellitus with diabetic chronic kidney disease: Secondary | ICD-10-CM

## 2015-09-07 LAB — BASIC METABOLIC PANEL
Anion gap: 14 (ref 5–15)
BUN: 31 mg/dL — AB (ref 6–20)
CHLORIDE: 97 mmol/L — AB (ref 101–111)
CO2: 27 mmol/L (ref 22–32)
Calcium: 9.9 mg/dL (ref 8.9–10.3)
Creatinine, Ser: 1.64 mg/dL — ABNORMAL HIGH (ref 0.61–1.24)
GFR calc non Af Amer: 38 mL/min — ABNORMAL LOW (ref >60)
GFR, EST AFRICAN AMERICAN: 44 mL/min — AB (ref >60)
Glucose, Bld: 109 mg/dL — ABNORMAL HIGH (ref 65–99)
POTASSIUM: 3.9 mmol/L (ref 3.5–5.1)
Sodium: 138 mmol/L (ref 135–145)

## 2015-09-07 LAB — GLUCOSE, CAPILLARY
GLUCOSE-CAPILLARY: 111 mg/dL — AB (ref 65–99)
GLUCOSE-CAPILLARY: 131 mg/dL — AB (ref 65–99)
GLUCOSE-CAPILLARY: 118 mg/dL — AB (ref 65–99)
GLUCOSE-CAPILLARY: 101 mg/dL — AB (ref 65–99)

## 2015-09-07 MED ORDER — APIXABAN 5 MG PO TABS
5.0000 mg | ORAL_TABLET | Freq: Two times a day (BID) | ORAL | Status: DC
  Administered 2015-09-07 – 2015-09-08 (×3): 5 mg via ORAL
  Filled 2015-09-07 (×3): qty 1

## 2015-09-07 NOTE — Progress Notes (Signed)
[facility] PROGRESS NOTE  ARAD BURSTON WUJ:[id number] DOB: 1934/02/16 DOA: 09/04/2015 PCP: Aida Puffer, MD  Assessment/Plan: 82 y/o male with PMH of HTN, CAD h/o CABG, h/o nephrectomy, CKD, DM, CHF, presented with shortness of breath, PND, orthopnea. admitted for diuresis for CHF, also found to have PAF scheduled for TEE/DCCV in AM  Acute on chronic systolic HF. Echo. LVEF-35-40%. Patient diuresed well with lasix, clinically improved. Holding  lasix due to mild worsening Cr. Resume lasix AM, cont BB,hydrlazine, NTG.    PAF. On apixaban, BB. Scheduled for TEE/cardiversion AM DM. D/c metformin due to renal issues. Check ha1c  Code Status: full Family Communication:  D/w patient, his wife.  person spoken with, relationship, and if by phone, the number) Disposition Plan: home 24-48 hrs    Consultants:  Cardiology   Procedures:  Pend TEE  Antibiotics:  none (indicate start date, and stop date if known)  HPI/Subjective: Alert, no distress   Objective: Filed Vitals:   09/07/15 0946 09/07/15 1149  BP: 114/70 109/64  Pulse: 82 80  Temp: 98.3 F (36.8 C) 97.6 F (36.4 C)  Resp:  18    Intake/Output Summary (Last 24 hours) at 09/07/15 1324 Last data filed at 09/07/15 1146  Gross per 24 hour  Intake    840 ml  Output    795 ml  Net     45 ml   Filed Weights   09/05/15 0500 09/06/15 0612 09/07/15 0552  Weight: 77.111 kg (170 lb) 75.932 kg (167 lb 6.4 oz) 71.487 kg (157 lb 9.6 oz)    Exam:   General:  Comfortable   Cardiovascular: s1,s2 irregular   Respiratory: CTA BL  Abdomen: soft, nt,nd   Musculoskeletal: no leg edema    Data Reviewed: Basic Metabolic Panel:  Recent Labs Lab 09/04/15 1430 09/05/15 0346 09/06/15 0340 09/07/15 0450  NA 138 141 138 138  K 4.3 4.0 3.9 3.9  CL 103 101 99* 97*  CO2 GLUCOSE 117* 122* 111* 109*  BUN 23* 22* 23* 31*  CREATININE 1.37* 1.36* 1.48* 1.64*  CALCIUM 9.6 9.7 9.5 9.9   Liver Function  Tests:  Recent Labs Lab 09/04/15 1430  AST 21  ALT 18  ALKPHOS 66  BILITOT 1.1  PROT 7.4  ALBUMIN 3.9   No results for input(s): LIPASE, AMYLASE in the last 168 hours. No results for input(s): AMMONIA in the last 168 hours. CBC:  Recent Labs Lab 09/04/15 1430  WBC 9.3  NEUTROABS 5.9  HGB 12.0*  HCT 37.7*  MCV 88.7  PLT 265   Cardiac Enzymes:  Recent Labs Lab 09/04/15 2041 09/05/15 0346  TROPONINI <0.03 0.03   BNP (last 3 results)  Recent Labs  09/04/15 1430  BNP 242.8*    ProBNP (last 3 results) No results for input(s): PROBNP in the last 8760 hours.  CBG:  Recent Labs Lab 09/06/15 1121 09/06/15 1726 09/06/15 2125 09/07/15 0550 09/07/15 1148  GLUCAP 126* 92 128* 111* 131*    No results found for this or any previous visit (from the past 240 hour(s)).   Studies: No results found.  Scheduled Meds: . allopurinol  300 mg Oral Daily  . apixaban  5 mg Oral BID  . aspirin  81 mg Oral Daily  . carvedilol  12.5 mg Oral BID  . cholecalciferol  50,000 Units Oral Weekly  . hydrALAZINE  10 mg Oral TID  . insulin aspart  0-9 Units Subcutaneous TID WC  . isosorbide mononitrate  30 mg Oral Daily  . levothyroxine  100 mcg Oral QAC breakfast  . multivitamin  1 tablet Oral BID  . sodium chloride flush  3 mL Intravenous Q12H  . vitamin B-12  500 mcg Oral Daily   Continuous Infusions:   Principal Problem:   Acute on chronic respiratory failure with hypoxia (HCC) Active Problems:   Hx of CABG x 4 2003   Hyperlipidemia-statin intol   S/P AAA repair 2003   Acute systolic (congestive) heart failure (HCC)   Chronic renal disease, stage III   Type 2 diabetes mellitus with renal manifestations, controlled (HCC)   Cardiomyopathy, ischemic-EF 35-40%   ACE inhibitor intolerance   Atrial fibrillation, new onset (HCC)   Emphysema, interstitial (HCC)   Acute on chronic renal insufficiency (HCC)    Time spent: >35 minutes     Esperanza SheetsBURIEV, Sylvain Hasten N  [facility] Pager [phone]. If 7PM-7AM, please contact night-coverage at [email or web address], password [email or web address] 09/07/2015, 1:24 PM  LOS: 3 days

## 2015-09-07 NOTE — Progress Notes (Signed)
Pt refused bed alarm overnight, pt A/Ox4 steady on feet, wife at bedside

## 2015-09-07 NOTE — Discharge Instructions (Addendum)
Information on my medicine - ELIQUIS (apixaban)  This medication education was reviewed with me or my healthcare representative as part of my discharge preparation.  The pharmacist that spoke with me during my hospital stay was:  Dennie Fetters, [facility]  Why was Eliquis prescribed for you? Eliquis was prescribed for you to reduce the risk of a blood clot forming that can cause a stroke if you have a medical condition called atrial fibrillation (a type of irregular heartbeat).  What do You need to know about Eliquis ? Take your Eliquis TWICE DAILY - one tablet in the morning and one tablet in the evening with or without food. If you have difficulty swallowing the tablet whole please discuss with your pharmacist how to take the medication safely.  Take Eliquis exactly as prescribed by your doctor and DO NOT stop taking Eliquis without talking to the doctor who prescribed the medication.  Stopping may increase your risk of developing a stroke.  Refill your prescription before you run out.  After discharge, you should have regular check-up appointments with your healthcare provider that is prescribing your Eliquis.  In the future your dose may need to be changed if your kidney function or weight changes by a significant amount or as you get older.  What do you do if you miss a dose? If you miss a dose, take it as soon as you remember on the same day and resume taking twice daily.  Do not take more than one dose of ELIQUIS at the same time to make up a missed dose.  Important Safety Information A possible side effect of Eliquis is bleeding. You should call your healthcare provider right away if you experience any of the following: ? Bleeding from an injury or your nose that does not stop. ? Unusual colored urine (red or dark brown) or unusual colored stools (red or black). ? Unusual bruising for unknown reasons. ? A serious fall or if you hit your head (even if there is no  bleeding).  Some medicines may interact with Eliquis and might increase your risk of bleeding or clotting while on Eliquis. To help avoid this, consult your healthcare provider or pharmacist prior to using any new prescription or non-prescription medications, including herbals, vitamins, non-steroidal anti-inflammatory drugs (NSAIDs) and supplements.  This website has more information on Eliquis (apixaban): http://www.eliquis.com/eliquis/home   Follow with Aida Puffer, MD in 2-4 weeks  Please get a complete blood count and chemistry panel checked by your Primary MD at your next visit, and again as instructed by your Primary MD. Please get your medications reviewed and adjusted by your Primary MD.  Please request your Primary MD to go over all Hospital Tests and Procedure/Radiological results at the follow up, please get all Hospital records sent to your Prim MD by signing hospital release before you go home.  If you had Pneumonia of Lung problems at the Hospital: Please get a 2 view Chest X ray done in 6-8 weeks after hospital discharge or sooner if instructed by your Primary MD.  If you have Congestive Heart Failure: Please call your Cardiologist or Primary MD anytime you have any of the following symptoms:  1) 3 pound weight gain in 24 hours or 5 pounds in 1 week  2) shortness of breath, with or without a dry hacking cough  3) swelling in the hands, feet or stomach  4) if you have to sleep on extra pillows at night in order to breathe  Follow cardiac low  salt diet and 1.5 lit/day fluid restriction.  If you have diabetes Accuchecks 4 times/day, Once in AM empty stomach and then before each meal. Log in all results and show them to your primary doctor at your next visit. If any glucose reading is under 80 or above 300 call your primary MD immediately.  If you have Seizure/Convulsions/Epilepsy: Please do not drive, operate heavy machinery, participate in activities at heights or  participate in high speed sports until you have seen by Primary MD or a Neurologist and advised to do so again.  If you had Gastrointestinal Bleeding: Please ask your Primary MD to check a complete blood count within one week of discharge or at your next visit. Your endoscopic/colonoscopic biopsies that are pending at the time of discharge, will also need to followed by your Primary MD.  Get Medicines reviewed and adjusted. Please take all your medications with you for your next visit with your Primary MD  Please request your Primary MD to go over all hospital tests and procedure/radiological results at the follow up, please ask your Primary MD to get all Hospital records sent to his/her office.  If you experience worsening of your admission symptoms, develop shortness of breath, life threatening emergency, suicidal or homicidal thoughts you must seek medical attention immediately by calling 911 or calling your MD immediately  if symptoms less severe.  You must read complete instructions/literature along with all the possible adverse reactions/side effects for all the Medicines you take and that have been prescribed to you. Take any new Medicines after you have completely understood and accpet all the possible adverse reactions/side effects.   Do not drive or operate heavy machinery when taking Pain medications.   Do not take more than prescribed Pain, Sleep and Anxiety Medications  Special Instructions: If you have smoked or chewed Tobacco  in the last 2 yrs please stop smoking, stop any regular Alcohol  and or any Recreational drug use.  Wear Seat belts while driving.  Please note You were cared for by a hospitalist during your hospital stay. If you have any questions about your discharge medications or the care you received while you were in the hospital after you are discharged, you can call the unit and asked to speak with the hospitalist on call if the hospitalist that took care of you  is not available. Once you are discharged, your primary care physician will handle any further medical issues. Please note that NO REFILLS for any discharge medications will be authorized once you are discharged, as it is imperative that you return to your primary care physician (or establish a relationship with a primary care physician if you do not have one) for your aftercare needs so that they can reassess your need for medications and monitor your lab values.  You can reach the hospitalist office at phone [phone] or fax [phone]   If you do not have a primary care physician, you can call [phone] for a physician referral.  Activity: As tolerated with Full fall precautions use walker/cane & assistance as needed  Diet: heart healthy  Disposition Home

## 2015-09-07 NOTE — Progress Notes (Signed)
Pt's O2 saturation ranges from 88 to 91% RA. Pt is refusing to have supplemental oxygen at this time. MD notification in process.

## 2015-09-07 NOTE — Progress Notes (Signed)
Subjective:  Pt off O2- says he feels well  Objective:  Vital Signs in the last 24 hours: Temp:  [97.4 F (36.3 C)-97.8 F (36.6 C)] 97.8 F (36.6 C) (05/16 0552) Pulse Rate:  [66-74] 69 (05/16 0552) Resp:  [18-20] 18 (05/16 0552) BP: (111-123)/(57-67) 119/57 mmHg (05/16 0552) SpO2:  [91 %-98 %] 98 % (05/16 0552) Weight:  [157 lb 9.6 oz (71.487 kg)] 157 lb 9.6 oz (71.487 kg) (05/16 0552)  Intake/Output from previous day:  Intake/Output Summary (Last 24 hours) at 09/07/15 0923 Last data filed at 09/07/15 0038  Gross per 24 hour  Intake   1320 ml  Output   1095 ml  Net    225 ml    Physical Exam: General appearance: alert, cooperative, no distress and mildly obese Neck: no JVD Lungs: decreased bs- c/w COPD Heart: irregularly irregular rhythm Neurologic: Grossly normal   Rate: 72  Rhythm: atrial fibrillation and premature ventricular contractions (PVC)  Lab Results:  Recent Labs  09/04/15 1430  WBC 9.3  HGB 12.0*  PLT 265    Recent Labs  09/06/15 0340 09/07/15 0450  NA 138 138  K 3.9 3.9  CL 99* 97*  CO2 29 27  GLUCOSE 111* 109*  BUN 23* 31*  CREATININE 1.48* 1.64*    Recent Labs  09/04/15 2041 09/05/15 0346  TROPONINI <0.03 0.03   No results for input(s): INR in the last 72 hours.  Scheduled Meds: . allopurinol  300 mg Oral Daily  . apixaban  5 mg Oral BID  . aspirin  81 mg Oral Daily  . carvedilol  12.5 mg Oral BID  . cholecalciferol  50,000 Units Oral Weekly  . hydrALAZINE  10 mg Oral TID  . insulin aspart  0-9 Units Subcutaneous TID WC  . isosorbide mononitrate  30 mg Oral Daily  . levothyroxine  100 mcg Oral QAC breakfast  . multivitamin  1 tablet Oral BID  . sodium chloride flush  3 mL Intravenous Q12H  . vitamin B-12  500 mcg Oral Daily   Continuous Infusions:  PRN Meds:.sodium chloride, acetaminophen, sodium chloride flush   Imaging: Imaging results have been reviewed  Cardiac Studies: Echo 09/05/15 Study  Conclusions  - Left ventricle: The cavity size was normal. Wall thickness was  normal. Systolic function was moderately reduced. The estimated  ejection fraction was in the range of 35% to 40%. Severe  hypokinesis of the apical myocardium. Severe hypokinesis of the  basal-midanterolateral and inferolateral myocardium. Mild  hypokinesis of the anteroseptal and anterior myocardium. - Ventricular septum: Septal motion showed paradox. - Aortic valve: There was trivial regurgitation. Valve area (VTI):  2.08 cm^2. Valve area (Vmax): 2.04 cm^2. Valve area (Vmean): 1.81  cm^2. - Mitral valve: There was mild to moderate regurgitation directed  centrally. - Left atrium: The atrium was moderately dilated. - Right ventricle: Systolic function was mildly reduced. - Right atrium: The atrium was moderately dilated. - Pulmonary arteries: Systolic pressure was moderately increased.  PA peak pressure: 55 mm Hg (S).   Assessment/Plan:  82 y/o male followed by Dr Elease HashimotoNahser with a history of CAD-s/p CABG x 25 June 2001 followed by AAA R&G June 2003, DM, HTN, CRI-3 s/p Lt nephrectomy in the 1950's, HLD (statin intol), and ICM- EF 40% by echo 2006. Pt was admitted with acute on chronic mixed CHF and new onset AF 09/04/15. He has diuresed 18 lbs.   Principal Problem:   Acute on chronic respiratory failure with hypoxia (HCC)  Active Problems:   Acute systolic (congestive) heart failure (HCC)   Atrial fibrillation, new onset (HCC)   Hx of CABG x 4 2003   Chronic renal disease, stage III   Type 2 diabetes mellitus with renal manifestations, controlled (HCC)   Cardiomyopathy, ischemic-EF 35-40%   Acute on chronic renal insufficiency (HCC)   Hyperlipidemia-statin intol   S/P AAA repair 2003   ACE inhibitor intolerance   Emphysema, interstitial (HCC)   PLAN: He'll receive his first dose of Eliquis 5 mg BID this am.    Corine Shelter PA-C 09/07/2015, 9:23 AM [phone]  I have seen and examined  the patient along with Corine Shelter PA-C.  I have reviewed the chart, notes and new data.  I agree with PA's note.  Key new complaints: still weak and mildly dyspneic Key examination findings: irregular rhythm  PLAN: TEE cardioversion tomorrow. This procedure has been fully reviewed with the patient and written informed consent has been obtained.   Thurmon Fair, MD, [facility] [phone] 09/07/2015, 11:43 AM

## 2015-09-07 NOTE — Progress Notes (Signed)
Pt ambulated in the hallway. O2 sat ranges 87 to 93% on RA. Pt denies feeling SOB and reports no other symptoms. Pt continues to refuse supplemental oxygen as previously documented. MD notified and order received for suppl. O2 which patient is refusing. Will continue to monitor

## 2015-09-08 ENCOUNTER — Inpatient Hospital Stay (HOSPITAL_COMMUNITY): Payer: Medicare Other | Admitting: Certified Registered Nurse Anesthetist

## 2015-09-08 ENCOUNTER — Encounter (HOSPITAL_COMMUNITY): Payer: Self-pay | Admitting: *Deleted

## 2015-09-08 ENCOUNTER — Ambulatory Visit (HOSPITAL_COMMUNITY): Payer: Medicare Other

## 2015-09-08 ENCOUNTER — Encounter (HOSPITAL_COMMUNITY)
Admission: EM | Disposition: A | Discharge status: Home / Self Care | Payer: Self-pay | Source: Home / Self Care | Attending: Internal Medicine

## 2015-09-08 DIAGNOSIS — I4891 Unspecified atrial fibrillation: Secondary | ICD-10-CM

## 2015-09-08 DIAGNOSIS — I340 Nonrheumatic mitral (valve) insufficiency: Secondary | ICD-10-CM

## 2015-09-08 DIAGNOSIS — I5043 Acute on chronic combined systolic (congestive) and diastolic (congestive) heart failure: Secondary | ICD-10-CM

## 2015-09-08 DIAGNOSIS — N179 Acute kidney failure, unspecified: Secondary | ICD-10-CM

## 2015-09-08 HISTORY — PX: CARDIOVERSION: SHX1299

## 2015-09-08 HISTORY — PX: TEE WITHOUT CARDIOVERSION: SHX5443

## 2015-09-08 LAB — CBC
HCT: 38.5 % — ABNORMAL LOW (ref 39.0–52.0)
Hemoglobin: 12.7 g/dL — ABNORMAL LOW (ref 13.0–17.0)
MCH: 29.2 pg (ref 26.0–34.0)
MCHC: 33 g/dL (ref 30.0–36.0)
MCV: 88.5 fL (ref 78.0–100.0)
PLATELETS: 278 10*3/uL (ref 150–400)
RBC: 4.35 MIL/uL (ref 4.22–5.81)
RDW: 16 % — ABNORMAL HIGH (ref 11.5–15.5)
WBC: 10.2 10*3/uL (ref 4.0–10.5)

## 2015-09-08 LAB — GLUCOSE, CAPILLARY
GLUCOSE-CAPILLARY: 123 mg/dL — AB (ref 65–99)
Glucose-Capillary: 137 mg/dL — ABNORMAL HIGH (ref 65–99)

## 2015-09-08 LAB — HEMOGLOBIN A1C
Hgb A1c MFr Bld: 6.3 % — ABNORMAL HIGH (ref 4.8–5.6)
Mean Plasma Glucose: 134 mg/dL

## 2015-09-08 LAB — BASIC METABOLIC PANEL
ANION GAP: 11 (ref 5–15)
BUN: 31 mg/dL — ABNORMAL HIGH (ref 6–20)
CHLORIDE: 99 mmol/L — AB (ref 101–111)
CO2: 28 mmol/L (ref 22–32)
Calcium: 9.8 mg/dL (ref 8.9–10.3)
Creatinine, Ser: 1.57 mg/dL — ABNORMAL HIGH (ref 0.61–1.24)
GFR calc non Af Amer: 40 mL/min — ABNORMAL LOW (ref >60)
GFR, EST AFRICAN AMERICAN: 47 mL/min — AB (ref >60)
Glucose, Bld: 107 mg/dL — ABNORMAL HIGH (ref 65–99)
POTASSIUM: 3.8 mmol/L (ref 3.5–5.1)
SODIUM: 138 mmol/L (ref 135–145)

## 2015-09-08 SURGERY — ECHOCARDIOGRAM, TRANSESOPHAGEAL
Anesthesia: Monitor Anesthesia Care

## 2015-09-08 MED ORDER — BUTAMBEN-TETRACAINE-BENZOCAINE 2-2-14 % EX AERO
INHALATION_SPRAY | CUTANEOUS | Status: DC | PRN
  Administered 2015-09-08: 2 via TOPICAL

## 2015-09-08 MED ORDER — PROPOFOL 10 MG/ML IV BOLUS
INTRAVENOUS | Status: DC | PRN
  Administered 2015-09-08: 30 mg via INTRAVENOUS

## 2015-09-08 MED ORDER — SODIUM CHLORIDE 0.9 % IV SOLN
INTRAVENOUS | Status: DC
  Administered 2015-09-08: 500 mL via INTRAVENOUS

## 2015-09-08 MED ORDER — EZETIMIBE 10 MG PO TABS
10.0000 mg | ORAL_TABLET | Freq: Every day | ORAL | Status: DC
  Administered 2015-09-08: 10 mg via ORAL
  Filled 2015-09-08: qty 1

## 2015-09-08 MED ORDER — EZETIMIBE 10 MG PO TABS: 10.0000 mg | ORAL_TABLET | Freq: Every day | ORAL | Status: DC

## 2015-09-08 MED ORDER — APIXABAN 5 MG PO TABS: 5.0000 mg | ORAL_TABLET | Freq: Two times a day (BID) | ORAL | Status: DC

## 2015-09-08 MED ORDER — PROPOFOL 500 MG/50ML IV EMUL
INTRAVENOUS | Status: DC | PRN
  Administered 2015-09-08: 75 ug/kg/min via INTRAVENOUS

## 2015-09-08 NOTE — Progress Notes (Signed)
Patient Name: Elijah Saunders L Bogdon Date of Encounter: 09/08/2015  Hospital Problem List     Principal Problem:   Acute on chronic combined systolic (congestive) and diastolic (congestive) heart failure (HCC) Active Problems:   Acute on chronic respiratory failure with hypoxia (HCC)   Cardiomyopathy, ischemic-EF 35-40%   Atrial fibrillation, new onset (HCC)   Hx of CABG x 4 2003   Chronic renal disease, stage III   Type 2 diabetes mellitus with renal manifestations, controlled (HCC)   Acute kidney injury superimposed on CKD (HCC)   Hyperlipidemia-statin intol   S/P AAA repair 2003   ACE inhibitor intolerance   Emphysema, interstitial (HCC)   Dyspnea    Subjective   Breathing stable.  Remains in AF - rate controlled.  Due to get 3rd dose of eliquis this AM followed by TEE/DCCV @ noon.  Inpatient Medications    . allopurinol  300 mg Oral Daily  . apixaban  5 mg Oral BID  . aspirin  81 mg Oral Daily  . carvedilol  12.5 mg Oral BID  . cholecalciferol  50,000 Units Oral Weekly  . hydrALAZINE  10 mg Oral TID  . insulin aspart  0-9 Units Subcutaneous TID WC  . isosorbide mononitrate  30 mg Oral Daily  . levothyroxine  100 mcg Oral QAC breakfast  . multivitamin  1 tablet Oral BID  . sodium chloride flush  3 mL Intravenous Q12H  . vitamin B-12  500 mcg Oral Daily    Vital Signs    Filed Vitals:   09/07/15 1149 09/07/15 1727 09/07/15 2120 09/08/15 0725  BP: 109/64 131/93 141/88 135/84  Pulse: 80  80 74  Temp: 97.6 F (36.4 C)  97.9 F (36.6 C) 98 F (36.7 C)  TempSrc: Oral  Oral Oral  Resp: 18  18 16   Height:      Weight:    156 lb 8 oz (70.988 kg)  SpO2: 91%  96% 96%    Intake/Output Summary (Last 24 hours) at 09/08/15 0908 Last data filed at 09/08/15 0810  Gross per 24 hour  Intake    240 ml  Output   1050 ml  Net   -810 ml   Filed Weights   09/06/15 0612 09/07/15 0552 09/08/15 0725  Weight: 167 lb 6.4 oz (75.932 kg) 157 lb 9.6 oz (71.487 kg) 156 lb 8 oz  (70.988 kg)    Physical Exam    General: Pleasant, NAD. Neuro: Alert and oriented X 3. Moves all extremities spontaneously. Psych: Normal affect. HEENT:  Normal  Neck: Supple without bruits or JVD. Lungs:  Resp regular and unlabored, CTA. Heart: IR, IR, no s3, s4, or murmurs. Abdomen: Soft, non-tender, non-distended, BS + x 4.  Extremities: No clubbing, cyanosis.  Trace bilat ankle edema. DP/PT/Radials 2+ and equal bilaterally.  Labs    CBC Lab Results  Component Value Date   WBC 9.3 09/04/2015   HGB 12.0* 09/04/2015   HCT 37.7* 09/04/2015   MCV 88.7 09/04/2015   PLT 265 09/04/2015    Basic Metabolic Panel  Recent Labs  09/07/15 0450 09/08/15 0453  NA 138 138  K 3.9 3.8  CL 97* 99*  CO2 27 28  GLUCOSE 109* 107*  BUN 31* 31*  CREATININE 1.64* 1.57*  CALCIUM 9.9 9.8    Hemoglobin A1C  Recent Labs  09/07/15 1545  HGBA1C 6.3*   Fasting Lipid Panel  Recent Labs  09/06/15 0340  CHOL 179  HDL 27*  LDLCALC 125*  TRIG 133  CHOLHDL 6.6   Thyroid Function Tests  Recent Labs  09/06/15 0340  TSH 3.894    Telemetry    Afib/flutter, 80's, freq pvc's/couplets.  Radiology    Dg Chest 2 View  09/04/2015  CLINICAL DATA:  SOB, congestion, cough X 1 wk Hx: HTN, diabetes, ex-smoker, heart bypass surgery EXAM: CHEST  2 VIEW COMPARISON:  None available FINDINGS: Status post median sternotomy and CABG. The heart is mildly enlarged. There is prominence of interstitial markings throughout the lungs bilaterally. Small effusions are present. No focal consolidations or alveolar opacities are identified. Mid thoracic spondylosis noted. Surgical clips are noted in the upper abdomen. IMPRESSION: Cardiomegaly. Interstitial prominence favors interstitial edema. Interstitial lung disease could have a similar appearance. Electronically Signed   By: Norva Pavlov M.D.   On: 09/04/2015 16:08   Ct Angio Chest Pe W/cm &/or Wo Cm  09/04/2015  CLINICAL DATA:  SOB X 1 WEEK, PT.  HAS 1 KIDNEY, METFORMIN FOR DIABETES, GFR=47, DR. ENTRIKIN APPROVED REDUCED DOSE FOR CT ANGIO CHEST, ORDERING PHYSICIAN AWARE OF RENAL FUNCTIONS AND PLANS TO HYDRATE, 75 ML ISOVUE 370, ELEVATED D-DIMER, LW EXAM: CT ANGIOGRAPHY CHEST WITH CONTRAST TECHNIQUE: Multidetector CT imaging of the chest was performed using the standard protocol during bolus administration of intravenous contrast. Multiplanar CT image reconstructions and MIPs were obtained to evaluate the vascular anatomy. CONTRAST:  75 cc Isovue 370 COMPARISON:  Chest x-ray 09/04/2015 FINDINGS: Heart: Extensive coronary artery calcifications are present. Heart is mildly enlarged. No pericardial effusion. Vascular structures: Ascending aortic arch is 4.1 cm. Aorta is tortuous as are the great vessels. The pulmonary arteries are well opacified. No acute pulmonary embolus identified. Mediastinum/thyroid: Small but numerous mediastinal lymph nodes are identified. These measure 1 cm in short axis and smaller. No significant hilar lymphadenopathy. Lungs/Airways: There are bilateral pleural effusions. Emphysematous changes are identified primarily at the apices. There are areas of air trapping. Smooth septal thickening is noted, most consistent with pulmonary edema. No suspicious pulmonary nodules are identified. Upper abdomen: Suspect colonic diverticula. Visualized portions of the adrenal glands are normal. Chest wall/osseous structures: Status post median sternotomy an CABG. Midthoracic spondylosis. No suspicious lytic or blastic lesions are identified. Review of the MIP images confirms the above findings. IMPRESSION: 1. Technically adequate exam showing no acute pulmonary embolus. 2. Coronary artery disease.  Mild cardiomegaly. 3. Ascending thoracic aorta is 4.1 cm. Recommend annual imaging followup by CTA or MRA. This recommendation follows 2010 ACCF/AHA/AATS/ACR/ASA/SCA/SCAI/SIR/STS/SVM Guidelines for the Diagnosis and Management of Patients with Thoracic  Aortic Disease. Circulation. 2010; 121: Z610-R604 4. Bilateral pleural effusions and parenchymal changes of pulmonary edema. 5. Emphysematous changes and areas of air trapping. 6. Small mediastinal lymph nodes may be reactive. 7. Suspect colonic diverticulosis in the upper abdomen. Electronically Signed   By: Norva Pavlov M.D.   On: 09/04/2015 17:44    Assessment & Plan    1.  Acute on chronic combined systolic and diastolic CHF:  Pt presented with progressive dyspnea, edema, and early satiety and was found to be in rate controlled AF.  Echo this admission showed EF of 35-40% - which appears to be stable compared with EF on myoview in 02/2011.  He is minus 2.9L this admission.  Per wts recorded, he is down to 156 (19 lbs since admission) though he says that that was not accurate and that he was 165 this AM, not 156.  I will ask for him to be re-weighed.  He has not received lasix  since 5/15.  Relatively euvolemic on exam - trace ankle edema (varicose veins).  Neck veins flat.  Cont  blocker, hydral/nitrate.  No acei/arb/arni/spiro 2/2 CKD III with acute worsening this admission.  Plan TEE/DCCV today in setting of AF, which he doesn't tolerate despite rate control.  Also has freq pvc's and couplets - ? Role they may be playing in cardiomyopathy.  May need 24 hr holter @ d/c to objectify pvc burden.  2.  CAD:  S/p prior CABG x 4 in 06/2011.  No chest pain.  EF stable.  Trop 0.03.  Doubt Ss 2/2 ischemia. Cont asa,  blocker.  He is intolerant to statins and fibrates.  Add zetia w/ LDL of 125.  3.  Hypertensive Heart Disease: BP stable on  blocker, hydral/nitrate.  4.  HL:  LDL 125.  Intolerant to statins.  Add zetia.  5.  Acute on chronic stage III kidney dzs:  Acute worsening with diuresis, lower today @ 1.57.  Cont to hold lasix - may not need any if he is able to maintain sinus rhythm.  6.  Afib:  ? Duration.  Rate controlled on  blocker.  For TEE/DCCV today.  If he has recurrent AF following  DCCV, will need antiarrhythmic therapy since he develops CHF in AF.  CHA2DS2VASc = 6  eliquis started yesterday.  Due to receive third dose today.  7.  Type II DM: glucose stable on SSI.  Per IM.  Signed, Nicolasa Ducking NP  I have seen and examined the patient along with Nicolasa Ducking NP.  I have reviewed the chart, notes and new data.  I agree with NP's note.  Key new complaints: feels better Key examination changes: regular rhythm with ectopy (PVCs on monitor) Key new findings / data: had successful DCCV  PLAN: Reinforced need for chronic anticoagulation, critical for next 30 days. He has an appt with D. Nahser already scheduled. Would send home without additional diuretics, but with instructions to weigh daily and call if weight increases > 3 lb from his weight when he arrives home today.   Thurmon Fair, MD, [facility] [phone] 09/08/2015, 3:39 PM

## 2015-09-08 NOTE — Progress Notes (Signed)
Coupon card for Eliquis given to pt/ spouse for a 30 day supply of medication.   RE: Benefit check      Mardene SayerDora Greenlee CMA           S/W [place] @ [employer] # [id number]   ELIQUIS 5 MG BID ( 30 )   COVER- YES  CO-PAY- $ 215.00  TIER - 3 DRUG  PRIOR APPROVAL - YES # [id number]  PHARMACY : [facility]   APIXABAN- NOT ON FORMULARY

## 2015-09-08 NOTE — Progress Notes (Signed)
Echocardiogram Echocardiogram Transesophageal has been performed.  Nolon RodBrown, Tony 09/08/2015, 4:48 PM

## 2015-09-08 NOTE — Procedures (Signed)
Electrical Cardioversion Procedure Note Elijah PontiffHoward L Saunders [id number] 11/13/1933  Procedure: Electrical Cardioversion Indications:  Atrial Fibrillation  Procedure Details Consent: Risks of procedure as well as the alternatives and risks of each were explained to the (patient/caregiver).  Consent for procedure obtained. Time Out: Verified patient identification, verified procedure, site/side was marked, verified correct patient position, special equipment/implants available, medications/allergies/relevent history reviewed, required imaging and test results available.  Performed  Patient placed on cardiac monitor, pulse oximetry, supplemental oxygen as necessary.  Sedation given: Propofol per anesthesiology Pacer pads placed anterior and posterior chest.  Cardioverted 1 time(s).  Cardioverted at 200J.  Evaluation Findings: Post procedure EKG shows: NSR with PACs and PVCs.  Complications: None Patient did tolerate procedure well.   Elijah Saunders 09/08/2015, 1:58 PM

## 2015-09-08 NOTE — Interval H&P Note (Signed)
History and Physical Interval Note:  09/08/2015 1:41 PM  Elijah Saunders  has presented today for surgery, with the diagnosis of a fib  The various methods of treatment have been discussed with the patient and family. After consideration of risks, benefits and other options for treatment, the patient has consented to  Procedure(s): TRANSESOPHAGEAL ECHOCARDIOGRAM (TEE) (N/A) CARDIOVERSION (N/A) as a surgical intervention .  The patient's history has been reviewed, patient examined, no change in status, stable for surgery.  I have reviewed the patient's chart and labs.  Questions were answered to the patient's satisfaction.     Leveta Wahab [employer]

## 2015-09-08 NOTE — Anesthesia Postprocedure Evaluation (Signed)
Anesthesia Post Note  Patient: Elijah Saunders  Procedure(s) Performed: Procedure(s) (LRB): TRANSESOPHAGEAL ECHOCARDIOGRAM (TEE) (N/A) CARDIOVERSION (N/A)  Patient location during evaluation: Endoscopy Anesthesia Type: MAC Level of consciousness: awake Pain management: pain level controlled Vital Signs Assessment: post-procedure vital signs reviewed and stable Respiratory status: spontaneous breathing Cardiovascular status: stable Postop Assessment: no signs of nausea or vomiting Anesthetic complications: no    Last Vitals:  Filed Vitals:   09/08/15 1420 09/08/15 1500  BP: 120/76 113/62  Pulse: 75 79  Temp:  36.6 C  Resp: 18 18    Last Pain:  Filed Vitals:   09/08/15 1525  PainSc: 0-No pain                 Denesha Brouse

## 2015-09-08 NOTE — Anesthesia Procedure Notes (Signed)
Procedure Name: MAC Date/Time: 09/08/2015 1:36 PM Performed by: Quentin OreWALKER, Alaiah Lundy E Pre-anesthesia Checklist: Patient identified, Emergency Drugs available, Suction available, Patient being monitored and Timeout performed Patient Re-evaluated:Patient Re-evaluated prior to inductionOxygen Delivery Method: Circle system utilized Intubation Type: IV induction Placement Confirmation: positive ETCO2

## 2015-09-08 NOTE — Discharge Summary (Signed)
Physician Discharge Summary  Elijah Saunders:[id number] DOB: 1933/06/24 DOA: 09/04/2015  PCP: Aida Puffer, MD  Admit date: 09/04/2015 Discharge date: 09/08/2015  Time spent: > 30 minutes  Recommendations for Outpatient Follow-up:  1. Follow up with PCP 2-4 weeks 2. Follow up with Dr. [name] in 1-2 weeks 3. Patient started on Eliquis    Discharge Diagnoses:  Principal Problem:   Acute on chronic combined systolic (congestive) and diastolic (congestive) heart failure (HCC) Active Problems:   Hx of CABG x 4 2003   Hyperlipidemia-statin intol   S/P AAA repair 2003   Acute on chronic respiratory failure with hypoxia (HCC)   Chronic renal disease, stage III   Type 2 diabetes mellitus with renal manifestations, controlled (HCC)   Cardiomyopathy, ischemic-EF 35-40%   ACE inhibitor intolerance   Atrial fibrillation, new onset (HCC)   Emphysema, interstitial (HCC)   Dyspnea   Acute kidney injury superimposed on CKD [facility])   Discharge Condition: stable  Diet recommendation: heart healthy  Filed Weights   09/08/15 0725 09/08/15 0937 09/08/15 1238  Weight: 70.988 kg (156 lb 8 oz) 75.705 kg (166 lb 14.4 oz) 75.705 kg (166 lb 14.4 oz)    History of present illness:  See H&P, Labs, Consult and Test reports for all details in brief, patient is a 82 y/o male with PMH of HTN, CAD h/o CABG, h/o nephrectomy, CKD, DM, CHF, presented with shortness of breath, PND, orthopnea. admitted for diuresis for CHF, also found to have PAF scheduled for TEE/DCCV in AM  Hospital Course:  Acute on chronic combined systolic HF - Echocardiogram showed LVEF-35-40%. Cardiology was consulted and have followed patient while hospitalized. He was diuresed well with IV Lasix and is euvolemic on discharge. No ARB / ACEI due to CKD PAF - rate controlled, felt by cardiology that he develops CHF symptoms and recommending rhythm control. S/p TEE / DCCV 5/17. Started on Eliquis. ChadsVasc2 score 5 DM - controlled, A1C  6.3 HLD - started on Zetia CAD - stable, no chest pain CKD III - Cr at baseline   Procedures:  TEE / DCCV   2D echo EF 35-40%  Consultations:  Cardiology   Discharge Exam: Filed Vitals:   09/08/15 1238 09/08/15 1411 09/08/15 1412 09/08/15 1420  BP: 128/65 103/60  120/76  Pulse: 81 59  75  Temp: 97.4 F (36.3 C) 97.4 F (36.3 C)    TempSrc: Oral Oral    Resp: Height:  (1.727 m)     Weight: 75.705 kg (166 lb 14.4 oz)     SpO2: 94% 95% 96% 98%    General: NAD Cardiovascular: RRR Respiratory: CTA biL  Discharge Instructions Activity:  As tolerated   Get Medicines reviewed and adjusted: Please take all your medications with you for your next visit with your Primary MD  Please request your Primary MD to go over all hospital tests and procedure/radiological results at the follow up, please ask your Primary MD to get all Hospital records sent to his/her office.  If you experience worsening of your admission symptoms, develop shortness of breath, life threatening emergency, suicidal or homicidal thoughts you must seek medical attention immediately by calling 911 or calling your MD immediately if symptoms less severe.  You must read complete instructions/literature along with all the possible adverse reactions/side effects for all the Medicines you take and that have been prescribed to you. Take any new Medicines after you have completely understood and accpet all the possible  adverse reactions/side effects.   Do not drive when taking Pain medications.   Do not take more than prescribed Pain, Sleep and Anxiety Medications  Special Instructions: If you have smoked or chewed Tobacco in the last 2 yrs please stop smoking, stop any regular Alcohol and or any Recreational drug use.  Wear Seat belts while driving.  Please note  You were cared for by a hospitalist during your hospital stay. Once you are discharged, your primary care physician will handle  any further medical issues. Please note that NO REFILLS for any discharge medications will be authorized once you are discharged, as it is imperative that you return to your primary care physician (or establish a relationship with a primary care physician if you do not have one) for your aftercare needs so that they can reassess your need for medications and monitor your lab values.    Medication List    TAKE these medications        allopurinol 300 MG tablet  Commonly known as:  ZYLOPRIM  Take 300 mg by mouth daily.     apixaban 5 MG Tabs tablet  Commonly known as:  ELIQUIS  Take 1 tablet (5 mg total) by mouth 2 (two) times daily.     aspirin 81 MG tablet  Take 1 tablet (81 mg total) by mouth daily.     carvedilol 12.5 MG tablet  Commonly known as:  COREG  Take 1 tablet (12.5 mg total) by mouth 2 (two) times daily.     cyanocobalamin 500 MCG tablet  Take 500 mcg by mouth daily.     ezetimibe 10 MG tablet  Commonly known as:  ZETIA  Take 1 tablet (10 mg total) by mouth daily.     glimepiride 1 MG tablet  Commonly known as:  AMARYL  Take 1 mg by mouth daily.     hydrALAZINE 10 MG tablet  Commonly known as:  APRESOLINE  Take 1 tablet (10 mg total) by mouth 3 (three) times daily.     isosorbide mononitrate 30 MG 24 hr tablet  Commonly known as:  IMDUR  Take 1 tablet (30 mg total) by mouth daily.     levothyroxine 100 MCG tablet  Commonly known as:  SYNTHROID, LEVOTHROID  Take 100 mcg by mouth daily before breakfast.     metFORMIN 500 MG tablet  Commonly known as:  GLUCOPHAGE  Take 500 mg by mouth 2 (two) times daily.     PRESERVISION AREDS 2 Caps  Take 1 capsule by mouth 2 (two) times daily.     VITAMIN D PO  Take 50,000 Units by mouth once a week.           Follow-up Information    Follow up with Saunders,[name], MD. Schedule an appointment as soon as possible for a visit in 1 month.   Specialty:  Family Medicine   Contact information:   [address] [place] [address] [phone]       Follow up with Elijah Miss, MD. Schedule an appointment as soon as possible for a visit in 1 week.   Specialty:  Cardiology   Contact information:   [address] [place] [address] [phone]       The results of significant diagnostics from this hospitalization (including imaging, microbiology, ancillary and laboratory) are listed below for reference.    Significant Diagnostic Studies: Dg Chest 2 View  09/04/2015  CLINICAL DATA:  SOB, congestion, cough X  1 wk Hx: HTN, diabetes, ex-smoker, heart bypass surgery EXAM: CHEST  2 VIEW COMPARISON:  None available FINDINGS: Status post median sternotomy and CABG. The heart is mildly enlarged. There is prominence of interstitial markings throughout the lungs bilaterally. Small effusions are present. No focal consolidations or alveolar opacities are identified. Mid thoracic spondylosis noted. Surgical clips are noted in the upper abdomen. IMPRESSION: Cardiomegaly. Interstitial prominence favors interstitial edema. Interstitial lung disease could have a similar appearance. Electronically Signed   By: Norva Pavlov M.D.   On: 09/04/2015 16:08   Ct Angio Chest Pe W/cm &/or Wo Cm  09/04/2015  CLINICAL DATA:  SOB X 1 WEEK, PT. HAS 1 KIDNEY, METFORMIN FOR DIABETES, GFR=47, DR. ENTRIKIN APPROVED REDUCED DOSE FOR CT ANGIO CHEST, ORDERING PHYSICIAN AWARE OF RENAL FUNCTIONS AND PLANS TO HYDRATE, 75 ML ISOVUE 370, ELEVATED D-DIMER, LW EXAM: CT ANGIOGRAPHY CHEST WITH CONTRAST TECHNIQUE: Multidetector CT imaging of the chest was performed using the standard protocol during bolus administration of intravenous contrast. Multiplanar CT image reconstructions and MIPs were obtained to evaluate the vascular anatomy. CONTRAST:  75 cc Isovue 370 COMPARISON:  Chest x-ray 09/04/2015 FINDINGS: Heart: Extensive coronary artery calcifications are present. Heart is mildly enlarged. No pericardial effusion. Vascular  structures: Ascending aortic arch is 4.1 cm. Aorta is tortuous as are the great vessels. The pulmonary arteries are well opacified. No acute pulmonary embolus identified. Mediastinum/thyroid: Small but numerous mediastinal lymph nodes are identified. These measure 1 cm in short axis and smaller. No significant hilar lymphadenopathy. Lungs/Airways: There are bilateral pleural effusions. Emphysematous changes are identified primarily at the apices. There are areas of air trapping. Smooth septal thickening is noted, most consistent with pulmonary edema. No suspicious pulmonary nodules are identified. Upper abdomen: Suspect colonic diverticula. Visualized portions of the adrenal glands are normal. Chest wall/osseous structures: Status post median sternotomy an CABG. Midthoracic spondylosis. No suspicious lytic or blastic lesions are identified. Review of the MIP images confirms the above findings. IMPRESSION: 1. Technically adequate exam showing no acute pulmonary embolus. 2. Coronary artery disease.  Mild cardiomegaly. 3. Ascending thoracic aorta is 4.1 cm. Recommend annual imaging followup by CTA or MRA. This recommendation follows 2010 ACCF/AHA/AATS/ACR/ASA/SCA/SCAI/SIR/STS/SVM Guidelines for the Diagnosis and Management of Patients with Thoracic Aortic Disease. Circulation. 2010; 121: Z610-R604 4. Bilateral pleural effusions and parenchymal changes of pulmonary edema. 5. Emphysematous changes and areas of air trapping. 6. Small mediastinal lymph nodes may be reactive. 7. Suspect colonic diverticulosis in the upper abdomen. Electronically Signed   By: Norva Pavlov M.D.   On: 09/04/2015 17:44    Microbiology: No results found for this or any previous visit (from the past 240 hour(s)).   Labs: Basic Metabolic Panel:  Recent Labs Lab 09/04/15 1430 09/05/15 0346 09/06/15 0340 09/07/15 0450 09/08/15 0453  NA 138 141 138 138 138  K 4.3 4.0 3.9 3.9 3.8  CL 103 101 99* 97* 99*  CO2 23 26 29 27 28     GLUCOSE 117* 122* 111* 109* 107*  BUN 23* 22* 23* 31* 31*  CREATININE 1.37* 1.36* 1.48* 1.64* 1.57*  CALCIUM 9.6 9.7 9.5 9.9 9.8   Liver Function Tests:  Recent Labs Lab 09/04/15 1430  AST 21  ALT 18  ALKPHOS 66  BILITOT 1.1  PROT 7.4  ALBUMIN 3.9   No results for input(s): LIPASE, AMYLASE in the last 168 hours. No results for input(s): AMMONIA in the last 168 hours. CBC:  Recent Labs Lab 09/04/15 1430 09/08/15 0935  WBC 9.3 10.2  NEUTROABS 5.9  --   HGB 12.0* 12.7*  HCT 37.7* 38.5*  MCV 88.7 88.5  PLT 265 278   Cardiac Enzymes:  Recent Labs Lab 09/04/15 2041 09/05/15 0346  TROPONINI <0.03 0.03   BNP: BNP (last 3 results)  Recent Labs  09/04/15 1430  BNP 242.8*    ProBNP (last 3 results) No results for input(s): PROBNP in the last 8760 hours.  CBG:  Recent Labs Lab 09/07/15 1148 09/07/15 1734 09/07/15 2113 09/08/15 0639 09/08/15 1232  GLUCAP 131* 118* 101* 123* 137*       Signed:  GHERGHE, COSTIN  [facility] 09/08/2015, 3:11 PM

## 2015-09-08 NOTE — H&P (View-Only) (Signed)
Subjective:  Pt off O2- says he feels well  Objective:  Vital Signs in the last 24 hours: Temp:  [97.4 F (36.3 C)-97.8 F (36.6 C)] 97.8 F (36.6 C) (05/16 0552) Pulse Rate:  [66-74] 69 (05/16 0552) Resp:  [18-20] 18 (05/16 0552) BP: (111-123)/(57-67) 119/57 mmHg (05/16 0552) SpO2:  [91 %-98 %] 98 % (05/16 0552) Weight:  [157 lb 9.6 oz (71.487 kg)] 157 lb 9.6 oz (71.487 kg) (05/16 0552)  Intake/Output from previous day:  Intake/Output Summary (Last 24 hours) at 09/07/15 0923 Last data filed at 09/07/15 0038  Gross per 24 hour  Intake   1320 ml  Output   1095 ml  Net    225 ml    Physical Exam: General appearance: alert, cooperative, no distress and mildly obese Neck: no JVD Lungs: decreased bs- c/w COPD Heart: irregularly irregular rhythm Neurologic: Grossly normal   Rate: 72  Rhythm: atrial fibrillation and premature ventricular contractions (PVC)  Lab Results:  Recent Labs  09/04/15 1430  WBC 9.3  HGB 12.0*  PLT 265    Recent Labs  09/06/15 0340 09/07/15 0450  NA 138 138  K 3.9 3.9  CL 99* 97*  CO2 29 27  GLUCOSE 111* 109*  BUN 23* 31*  CREATININE 1.48* 1.64*    Recent Labs  09/04/15 2041 09/05/15 0346  TROPONINI <0.03 0.03   No results for input(s): INR in the last 72 hours.  Scheduled Meds: . allopurinol  300 mg Oral Daily  . apixaban  5 mg Oral BID  . aspirin  81 mg Oral Daily  . carvedilol  12.5 mg Oral BID  . cholecalciferol  50,000 Units Oral Weekly  . hydrALAZINE  10 mg Oral TID  . insulin aspart  0-9 Units Subcutaneous TID WC  . isosorbide mononitrate  30 mg Oral Daily  . levothyroxine  100 mcg Oral QAC breakfast  . multivitamin  1 tablet Oral BID  . sodium chloride flush  3 mL Intravenous Q12H  . vitamin B-12  500 mcg Oral Daily   Continuous Infusions:  PRN Meds:.sodium chloride, acetaminophen, sodium chloride flush   Imaging: Imaging results have been reviewed  Cardiac Studies: Echo 09/05/15 Study  Conclusions  - Left ventricle: The cavity size was normal. Wall thickness was  normal. Systolic function was moderately reduced. The estimated  ejection fraction was in the range of 35% to 40%. Severe  hypokinesis of the apical myocardium. Severe hypokinesis of the  basal-midanterolateral and inferolateral myocardium. Mild  hypokinesis of the anteroseptal and anterior myocardium. - Ventricular septum: Septal motion showed paradox. - Aortic valve: There was trivial regurgitation. Valve area (VTI):  2.08 cm^2. Valve area (Vmax): 2.04 cm^2. Valve area (Vmean): 1.81  cm^2. - Mitral valve: There was mild to moderate regurgitation directed  centrally. - Left atrium: The atrium was moderately dilated. - Right ventricle: Systolic function was mildly reduced. - Right atrium: The atrium was moderately dilated. - Pulmonary arteries: Systolic pressure was moderately increased.  PA peak pressure: 55 mm Hg (S).   Assessment/Plan:  82 y/o male followed by Dr Elease HashimotoNahser with a history of CAD-s/p CABG x 25 June 2001 followed by AAA R&G June 2003, DM, HTN, CRI-3 s/p Lt nephrectomy in the 1950's, HLD (statin intol), and ICM- EF 40% by echo 2006. Pt was admitted with acute on chronic mixed CHF and new onset AF 09/04/15. He has diuresed 18 lbs.   Principal Problem:   Acute on chronic respiratory failure with hypoxia (HCC)  Active Problems:   Acute systolic (congestive) heart failure (HCC)   Atrial fibrillation, new onset (HCC)   Hx of CABG x 4 2003   Chronic renal disease, stage III   Type 2 diabetes mellitus with renal manifestations, controlled (HCC)   Cardiomyopathy, ischemic-EF 35-40%   Acute on chronic renal insufficiency (HCC)   Hyperlipidemia-statin intol   S/P AAA repair 2003   ACE inhibitor intolerance   Emphysema, interstitial (HCC)   PLAN: He'll receive his first dose of Eliquis 5 mg BID this am.    Luke Kilroy PA-C 09/07/2015, 9:23 AM [phone]  I have seen and examined  the patient along with Luke Kilroy PA-C.  I have reviewed the chart, notes and new data.  I agree with PA's note.  Key new complaints: still weak and mildly dyspneic Key examination findings: irregular rhythm  PLAN: TEE cardioversion tomorrow. This procedure has been fully reviewed with the patient and written informed consent has been obtained.   Cerenity Goshorn, MD, FACC [facility] [phone] 09/07/2015, 11:43 AM   

## 2015-09-08 NOTE — Transfer of Care (Signed)
Immediate Anesthesia Transfer of Care Note  Patient: Elijah Saunders  Procedure(s) Performed: Procedure(s): TRANSESOPHAGEAL ECHOCARDIOGRAM (TEE) (N/A) CARDIOVERSION (N/A)  Patient Location: PACU  Anesthesia Type:MAC  Level of Consciousness: awake, alert  and oriented  Airway & Oxygen Therapy: Patient Spontanous Breathing and Patient connected to nasal cannula oxygen  Post-op Assessment: Report given to RN and Post -op Vital signs reviewed and stable  Post vital signs: Reviewed and stable  Last Vitals:  Filed Vitals:   09/08/15 1010 09/08/15 1238  BP: 126/72 128/65  Pulse: 74 81  Temp: 36.4 C 36.3 C  Resp: 18 19    Last Pain:  Filed Vitals:   09/08/15 1244  PainSc: 0-No pain         Complications: No apparent anesthesia complications

## 2015-09-08 NOTE — Progress Notes (Signed)
Patient is discharge to home accompanied by patient's spouse and NT via wheelchair. Prescriptions and discharge instructions given . Patient verbalizes understanding. All personal belongings given. Telemetry box and IV removed prior to discharge and site in good condition.  

## 2015-09-08 NOTE — Anesthesia Preprocedure Evaluation (Signed)
Anesthesia Evaluation  Patient identified by MRN, date of birth, ID band Patient awake    Reviewed: Allergy & Precautions, NPO status , Patient's Chart, lab work & pertinent test results  History of Anesthesia Complications Negative for: history of anesthetic complications  Airway Mallampati: II  TM Distance: >3 FB Neck ROM: Full    Dental  (+) Edentulous Upper, Edentulous Lower   Pulmonary shortness of breath, COPD, former smoker,    breath sounds clear to auscultation       Cardiovascular hypertension, Pt. on medications + CAD, + Past MI, + CABG, + Peripheral Vascular Disease and +CHF  + dysrhythmias Atrial Fibrillation + Valvular Problems/Murmurs AI and MR  Rhythm:Irregular     Neuro/Psych negative neurological ROS  negative psych ROS   GI/Hepatic negative GI ROS, Neg liver ROS,   Endo/Other  diabetes, Type 2, Oral Hypoglycemic Agents  Renal/GU Renal InsufficiencyRenal disease     Musculoskeletal   Abdominal   Peds  Hematology  (+) anemia ,   Anesthesia Other Findings   Reproductive/Obstetrics                             Anesthesia Physical Anesthesia Plan  ASA: III  Anesthesia Plan: MAC   Post-op Pain Management:    Induction: Intravenous  Airway Management Planned: Natural Airway, Nasal Cannula and Simple Face Mask  Additional Equipment: None  Intra-op Plan:   Post-operative Plan:   Informed Consent: I have reviewed the patients History and Physical, chart, labs and discussed the procedure including the risks, benefits and alternatives for the proposed anesthesia with the patient or authorized representative who has indicated his/her understanding and acceptance.   Dental advisory given  Plan Discussed with: CRNA and Surgeon  Anesthesia Plan Comments:         Anesthesia Quick Evaluation

## 2015-09-08 NOTE — Procedures (Signed)
Procedure: TEE  Indication: Atrial fibrillation   Sedation: Per anesthesiology.  Findings: Please see echo section for full report.  Normal LV size with EF 25-30%.  Akinesis of the apex, the basal to mid anteroseptal and anterior walls, and the apical inferior wall.  The RV was normal in size with mildly decreased systolic function.  There was trivial TR.  There was moderate MR.  The aortic valve was calcified and trileaflet, no significant stenosis.  Moderately dilated LA, no LA appendage thrombus. Mildly dilated RA.  No evidence for ASD or PFO.  Normal caliber aorta with grade III-IV plaque in the descending thoracic aorta.    Patient will proceed to DCCV.  Marca AnconaDalton McLean 09/08/2015 1:57 PM

## 2015-09-13 ENCOUNTER — Ambulatory Visit (INDEPENDENT_AMBULATORY_CARE_PROVIDER_SITE_OTHER): Payer: Medicare Other | Admitting: Cardiovascular Disease

## 2015-09-13 ENCOUNTER — Encounter: Payer: Self-pay | Admitting: Cardiovascular Disease

## 2015-09-13 VITALS — BP 132/62 | HR 57 | Ht 68.0 in | Wt 171.0 lb

## 2015-09-13 DIAGNOSIS — I4891 Unspecified atrial fibrillation: Secondary | ICD-10-CM | POA: Diagnosis not present

## 2015-09-13 DIAGNOSIS — N183 Chronic kidney disease, stage 3 unspecified: Secondary | ICD-10-CM

## 2015-09-13 DIAGNOSIS — I255 Ischemic cardiomyopathy: Secondary | ICD-10-CM | POA: Diagnosis not present

## 2015-09-13 MED ORDER — APIXABAN 5 MG PO TABS: 2.5000 mg | ORAL_TABLET | Freq: Two times a day (BID) | ORAL | Status: DC

## 2015-09-13 NOTE — Patient Instructions (Signed)
Medication Instructions:  DECREASE Eliquis to 1/2 tab twice daily until you run out of pills CALL Marcelino DusterMichelle to start Xarelto 15 mg once daily   Labwork: None Ordered   Testing/Procedures: None Ordered   Follow-Up: Your physician recommends that you schedule a follow-up appointment in: 3 months with Dr. Elease HashimotoNahser   If you need a refill on your cardiac medications before your next appointment, please call your pharmacy.   Thank you for choosing [facility]! Eligha BridegroomMichelle Swinyer, RN [phone]
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
## 2015-09-24 ENCOUNTER — Telehealth: Payer: Self-pay | Admitting: Cardiovascular Disease

## 2015-09-24 NOTE — Telephone Encounter (Signed)
New message:  Pt called in stating that he just received some unopened samples of Elquis from a family member that is no longer using them. He just wanted to make the nurse aware of this. Please f/u with him

## 2015-09-24 NOTE — Telephone Encounter (Signed)
Spoke with patient's wife who states a family member that died recently had 6 months worth of Eliquis 2.5 mg and the patient is going to take it before switching to Xarelto.  I advised that he should call back to let us know when he is ready to switch to Xarelto and needs a Rx sent in.  Patient's wife thanked me for the call.

## 2015-11-02 ENCOUNTER — Other Ambulatory Visit: Payer: Self-pay | Admitting: *Deleted

## 2015-11-02 DIAGNOSIS — I5022 Chronic systolic (congestive) heart failure: Secondary | ICD-10-CM

## 2015-11-02 DIAGNOSIS — I2510 Atherosclerotic heart disease of native coronary artery without angina pectoris: Secondary | ICD-10-CM

## 2015-11-02 MED ORDER — ISOSORBIDE MONONITRATE ER 30 MG PO TB24: 30.0000 mg | ORAL_TABLET | Freq: Every day | ORAL | Status: DC

## 2015-12-02 ENCOUNTER — Other Ambulatory Visit: Payer: Self-pay | Admitting: *Deleted

## 2015-12-02 DIAGNOSIS — I5022 Chronic systolic (congestive) heart failure: Secondary | ICD-10-CM

## 2015-12-02 DIAGNOSIS — I2510 Atherosclerotic heart disease of native coronary artery without angina pectoris: Secondary | ICD-10-CM

## 2015-12-02 MED ORDER — HYDRALAZINE HCL 10 MG PO TABS: 10.0000 mg | ORAL_TABLET | Freq: Three times a day (TID) | ORAL | 2 refills | Status: DC

## 2015-12-16 ENCOUNTER — Encounter: Payer: Self-pay | Admitting: Cardiovascular Disease

## 2015-12-30 ENCOUNTER — Ambulatory Visit (INDEPENDENT_AMBULATORY_CARE_PROVIDER_SITE_OTHER): Payer: Medicare Other | Admitting: Cardiovascular Disease

## 2015-12-30 ENCOUNTER — Encounter: Payer: Self-pay | Admitting: Cardiovascular Disease

## 2015-12-30 VITALS — BP 120/66 | HR 57 | Ht 68.0 in | Wt 167.6 lb

## 2015-12-30 DIAGNOSIS — I951 Orthostatic hypotension: Secondary | ICD-10-CM | POA: Diagnosis not present

## 2015-12-30 DIAGNOSIS — I4891 Unspecified atrial fibrillation: Secondary | ICD-10-CM

## 2015-12-30 MED ORDER — CARVEDILOL 6.25 MG PO TABS: 6.2500 mg | ORAL_TABLET | Freq: Two times a day (BID) | ORAL | 11 refills | Status: DC

## 2015-12-30 NOTE — Patient Instructions (Signed)
Medication Instructions:  DECREASE Coreg (Carvedilol) to 6.25 mg twice daily   Labwork: None Ordered   Testing/Procedures: None Ordered   Follow-Up: Your physician recommends that you schedule a follow-up appointment in: 3 months with Dr. Elease HashimotoNahser   If you need a refill on your cardiac medications before your next appointment, please call your pharmacy.   Thank you for choosing [facility]! Eligha BridegroomMichelle Analiyah Lechuga, RN [phone]
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
## 2016-01-12 ENCOUNTER — Telehealth: Payer: Self-pay | Admitting: Cardiovascular Disease

## 2016-01-12 NOTE — Telephone Encounter (Signed)
Glad to hear that he is doing well with the reduced dose of Coreg

## 2016-01-12 NOTE — Telephone Encounter (Signed)
Will forward to Dr. Nahser and his nurse to make them aware. 

## 2016-01-12 NOTE — Telephone Encounter (Signed)
New Message  Pt calling to inform Provider and  RN that since his bp med dosage for carvedilol has been decreased he has not had any issues. Please callback to discuss if needed

## 2016-03-30 ENCOUNTER — Ambulatory Visit (INDEPENDENT_AMBULATORY_CARE_PROVIDER_SITE_OTHER): Payer: Medicare Other | Admitting: Cardiovascular Disease

## 2016-03-30 ENCOUNTER — Encounter: Payer: Self-pay | Admitting: Cardiovascular Disease

## 2016-03-30 VITALS — BP 148/74 | HR 67 | Ht 68.0 in | Wt 166.0 lb

## 2016-03-30 DIAGNOSIS — I481 Persistent atrial fibrillation: Secondary | ICD-10-CM

## 2016-03-30 DIAGNOSIS — E784 Other hyperlipidemia: Secondary | ICD-10-CM | POA: Diagnosis not present

## 2016-03-30 DIAGNOSIS — I4819 Other persistent atrial fibrillation: Secondary | ICD-10-CM

## 2016-03-30 DIAGNOSIS — I5022 Chronic systolic (congestive) heart failure: Secondary | ICD-10-CM | POA: Diagnosis not present

## 2016-03-30 DIAGNOSIS — E7849 Other hyperlipidemia: Secondary | ICD-10-CM
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
## 2016-03-30 NOTE — Patient Instructions (Addendum)

## 2016-04-13 ENCOUNTER — Ambulatory Visit (INDEPENDENT_AMBULATORY_CARE_PROVIDER_SITE_OTHER): Payer: Medicare Other | Admitting: Ophthalmology

## 2016-05-08 ENCOUNTER — Ambulatory Visit (INDEPENDENT_AMBULATORY_CARE_PROVIDER_SITE_OTHER): Payer: Medicare Other | Admitting: Ophthalmology

## 2016-05-08 DIAGNOSIS — E11319 Type 2 diabetes mellitus with unspecified diabetic retinopathy without macular edema: Secondary | ICD-10-CM

## 2016-05-08 DIAGNOSIS — I10 Essential (primary) hypertension: Secondary | ICD-10-CM

## 2016-05-08 DIAGNOSIS — H33302 Unspecified retinal break, left eye: Secondary | ICD-10-CM

## 2016-05-08 DIAGNOSIS — H35033 Hypertensive retinopathy, bilateral: Secondary | ICD-10-CM | POA: Diagnosis not present

## 2016-05-08 DIAGNOSIS — H353132 Nonexudative age-related macular degeneration, bilateral, intermediate dry stage: Secondary | ICD-10-CM

## 2016-05-08 DIAGNOSIS — H43813 Vitreous degeneration, bilateral: Secondary | ICD-10-CM | POA: Diagnosis not present

## 2016-05-08 DIAGNOSIS — E113293 Type 2 diabetes mellitus with mild nonproliferative diabetic retinopathy without macular edema, bilateral: Secondary | ICD-10-CM | POA: Diagnosis not present

## 2016-05-08 DIAGNOSIS — H2513 Age-related nuclear cataract, bilateral: Secondary | ICD-10-CM

## 2016-05-29 ENCOUNTER — Other Ambulatory Visit: Payer: Self-pay | Admitting: Cardiovascular Disease

## 2016-05-29 DIAGNOSIS — I5022 Chronic systolic (congestive) heart failure: Secondary | ICD-10-CM

## 2016-05-29 DIAGNOSIS — I2510 Atherosclerotic heart disease of native coronary artery without angina pectoris: Secondary | ICD-10-CM

## 2016-05-31 ENCOUNTER — Other Ambulatory Visit: Payer: Self-pay | Admitting: Cardiovascular Disease

## 2016-05-31 DIAGNOSIS — I5022 Chronic systolic (congestive) heart failure: Secondary | ICD-10-CM

## 2016-05-31 DIAGNOSIS — I2510 Atherosclerotic heart disease of native coronary artery without angina pectoris: Secondary | ICD-10-CM

## 2016-05-31 MED ORDER — ISOSORBIDE MONONITRATE ER 30 MG PO TB24
30.0000 mg | ORAL_TABLET | Freq: Every day | ORAL | 10 refills | Status: DC
Start: 2016-05-31 — End: 2017-06-04

## 2016-11-17 ENCOUNTER — Encounter: Payer: Self-pay | Admitting: Cardiology

## 2016-11-17 ENCOUNTER — Ambulatory Visit (INDEPENDENT_AMBULATORY_CARE_PROVIDER_SITE_OTHER): Payer: Medicare Other | Admitting: Cardiology

## 2016-11-17 VITALS — BP 120/74 | HR 67 | Ht 68.0 in | Wt 164.0 lb

## 2016-11-17 DIAGNOSIS — R55 Syncope and collapse: Secondary | ICD-10-CM

## 2016-11-17 NOTE — Patient Instructions (Addendum)
Medication Instructions:  Your physician recommends that you continue on your current medications as directed. Please refer to the Current Medication list given to you today. Make sure you are drinking 64 oz water daily and take your medications with food  Labwork: None Ordered   Testing/Procedures: Your physician has recommended that you wear an event monitor. Event monitors are medical devices that record the heart's electrical activity. Doctors most often us these monitors to diagnose arrhythmias. Arrhythmias are problems with the speed or rhythm of the heartbeat. The monitor is a small, portable device. You can wear one while you do your normal daily activities. This is usually used to diagnose what is causing palpitations/syncope (passing out).  Your physician has requested that you have an echocardiogram. Echocardiography is a painless test that uses sound waves to create images of your heart. It provides your doctor with information about the size and shape of your heart and how well your heart's chambers and valves are working. This procedure takes approximately one hour. There are no restrictions for this procedure.   Follow-Up: Your physician recommends that you keep your follow-up appointment on August 23 with Dr. Elease HashimotoNahser Avoid driving until test results are completed  If you need a refill on your cardiac medications before your next appointment, please call your pharmacy.   Thank you for choosing [facility]! Eligha BridegroomMichelle Roxi Hlavaty, RN [phone]
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
## 2016-11-29 ENCOUNTER — Other Ambulatory Visit: Payer: Self-pay | Admitting: Cardiovascular Disease

## 2016-11-29 DIAGNOSIS — I5022 Chronic systolic (congestive) heart failure: Secondary | ICD-10-CM

## 2016-11-29 DIAGNOSIS — I2510 Atherosclerotic heart disease of native coronary artery without angina pectoris: Secondary | ICD-10-CM

## 2016-11-30 ENCOUNTER — Other Ambulatory Visit: Payer: Self-pay

## 2016-11-30 ENCOUNTER — Ambulatory Visit (HOSPITAL_COMMUNITY): Payer: Medicare Other | Attending: Cardiology

## 2016-11-30 ENCOUNTER — Ambulatory Visit (INDEPENDENT_AMBULATORY_CARE_PROVIDER_SITE_OTHER): Payer: Medicare Other

## 2016-11-30 DIAGNOSIS — R55 Syncope and collapse: Secondary | ICD-10-CM

## 2016-11-30 DIAGNOSIS — I080 Rheumatic disorders of both mitral and aortic valves: Secondary | ICD-10-CM | POA: Insufficient documentation

## 2016-12-05 ENCOUNTER — Telehealth: Payer: Self-pay | Admitting: Cardiovascular Disease

## 2016-12-05 NOTE — Telephone Encounter (Signed)
Patient calling for echo results 

## 2016-12-05 NOTE — Telephone Encounter (Signed)
Reviewed results of echo with patient and answered his questions to his satisfaction. He continues to wear the event monitor. He is aware of upcoming appointment with Dr. Elease HashimotoNahser on 8/23. I advised him to call back with additional questions or concerns prior to that appointment. He thanked me for the call.

## 2016-12-14 ENCOUNTER — Encounter: Payer: Self-pay | Admitting: Cardiovascular Disease

## 2016-12-14 ENCOUNTER — Other Ambulatory Visit: Payer: Self-pay | Admitting: Cardiovascular Disease

## 2016-12-14 ENCOUNTER — Ambulatory Visit (INDEPENDENT_AMBULATORY_CARE_PROVIDER_SITE_OTHER): Payer: Medicare Other | Admitting: Cardiovascular Disease

## 2016-12-14 ENCOUNTER — Encounter (INDEPENDENT_AMBULATORY_CARE_PROVIDER_SITE_OTHER): Payer: Self-pay

## 2016-12-14 VITALS — BP 140/80 | HR 59 | Ht 68.0 in | Wt 165.4 lb

## 2016-12-14 DIAGNOSIS — I5043 Acute on chronic combined systolic (congestive) and diastolic (congestive) heart failure: Secondary | ICD-10-CM

## 2016-12-14 DIAGNOSIS — R55 Syncope and collapse: Secondary | ICD-10-CM | POA: Diagnosis not present

## 2016-12-14 DIAGNOSIS — I5022 Chronic systolic (congestive) heart failure: Secondary | ICD-10-CM

## 2016-12-14 LAB — CBC
HEMATOCRIT: 40.1 % (ref 37.5–51.0)
HEMOGLOBIN: 13.2 g/dL (ref 13.0–17.7)
MCH: 29.5 pg (ref 26.6–33.0)
MCHC: 32.9 g/dL (ref 31.5–35.7)
MCV: 90 fL (ref 79–97)
Platelets: 276 10*3/uL (ref 150–379)
RBC: 4.48 x10E6/uL (ref 4.14–5.80)
RDW: 15.1 % (ref 12.3–15.4)
WBC: 10.4 10*3/uL (ref 3.4–10.8)

## 2016-12-14 LAB — BASIC METABOLIC PANEL
BUN/Creatinine Ratio: 18 (ref 10–24)
BUN: 23 mg/dL (ref 8–27)
CALCIUM: 10.2 mg/dL (ref 8.6–10.2)
CO2: 23 mmol/L (ref 20–29)
CREATININE: 1.27 mg/dL (ref 0.76–1.27)
Chloride: 100 mmol/L (ref 96–106)
GFR calc Af Amer: 61 mL/min/{1.73_m2} (ref >59)
GFR, EST NON AFRICAN AMERICAN: 53 mL/min/{1.73_m2} — AB (ref >59)
Glucose: 90 mg/dL (ref 65–99)
Potassium: 4.7 mmol/L (ref 3.5–5.2)
Sodium: 139 mmol/L (ref 134–144)

## 2016-12-14 LAB — PROTIME-INR
INR: 1.1 (ref 0.8–1.2)
Prothrombin Time: 11.5 s (ref 9.1–12.0)
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
## 2016-12-14 NOTE — Patient Instructions (Addendum)
Medication Instructions:  Your physician recommends that you continue on your current medications as directed. Please refer to the Current Medication list given to you today.   Labwork: TODAY - Pt/INR, CBC, BMET   Testing/Procedures:   [place] [facility] CARDIOVASCULAR DIVISION [facility] OFFICE [address] [place] [address] Dept: [phone] Loc: [phone]  Elijah Saunders  12/14/2016  You are scheduled for a Cardiac Catheterization on Tuesday, August 28 with Dr. Verdis Prime.  1. Please arrive at the [facility] (Main Entrance A) at [facility]: [address] [place], [place] [address] at 7:00 AM (two hours before your procedure to ensure your preparation). Free valet parking service is available.   Special note: Every effort is made to have your procedure done on time. Please understand that emergencies sometimes delay scheduled procedures.  2. Diet: Do not eat or drink anything after midnight prior to your procedure except sips of water to take medications.  3. Labs: Your lab work will be done today  4. Medication instructions in preparation for your procedure:  Stop taking Eliquis after your Saturday evening dose You may resume it on Wednesday (the day after your procedure)  Stop taking, Glucophage (Metformin) on Sunday, August 26.  (take evening dose on Sunday and then do not take again until Friday morning)  On the morning of your procedure, take your morning medicines NOT listed above.  You may use sips of water.  5. Plan for one night stay--bring personal belongings. 6. Bring a current list of your medications and current insurance cards. 7. You MUST have a responsible person to drive you home. 8. Someone MUST be with you the first 24 hours after you arrive home or your discharge will be delayed. 9. Please wear clothes that are easy to get on and off and wear slip-on shoes.  Thank you for allowing [place]  to care for you!   --  Invasive Cardiovascular services   Follow-Up: You have been referred to EP for evaluation of your low ejection fraction and near-syncope   Your physician recommends that you schedule a follow-up appointment in: 3 months with Dr. Elease Saunders   If you need a refill on your cardiac medications before your next appointment, please call your pharmacy.   Thank you for choosing [facility]! [name], RN [phone]

## 2016-12-18 ENCOUNTER — Telehealth: Payer: Self-pay | Admitting: Cardiovascular Disease

## 2016-12-18 ENCOUNTER — Telehealth: Payer: Self-pay

## 2016-12-18 NOTE — Telephone Encounter (Signed)
Patient contacted pre-catheterization at [facility] scheduled for:  12/19/2016 @ 0900 Verified arrival time and place:  NT @ 0700 Confirmed AM meds to be taken pre-cath with sip of water: Take ASA Hold Amaryl morning of Metformin-last dose Sun evening prior to procedure Eliquis- last dose Sat evening prior to procedure Pt has been compliant with medication instructions-Told Pt to take ASA, synthroid, coreg and hydralazine prior to procedure Confirmed patient has responsible person to drive home post procedure and observe patient for 24 hours:  wife Addl concerns:  Asking about taking off his heart monitor

## 2016-12-18 NOTE — Telephone Encounter (Signed)
Spoke with patient and answered questions about cardiac cath tomorrow. Patient asks about taking off his monitor before the cath and I advised that he leave that at home during the procedure. He verified medication instructions and is already holding his Eliquis and Metformin as directed. He thanked me for the call.

## 2016-12-18 NOTE — Telephone Encounter (Signed)
New Message  Pt call requesting to speak with RN about a cath procedure on 8/28. Please call back to discuss

## 2016-12-19 ENCOUNTER — Ambulatory Visit (HOSPITAL_COMMUNITY)
Admission: RE | Admit: 2016-12-19 | Discharge: 2016-12-19 | Disposition: A | Discharge status: Home / Self Care | Payer: Medicare Other | Source: Ambulatory Visit | Attending: Interventional Cardiology | Admitting: Interventional Cardiology

## 2016-12-19 ENCOUNTER — Encounter (HOSPITAL_COMMUNITY)
Admission: RE | Disposition: A | Discharge status: Home / Self Care | Payer: Self-pay | Source: Ambulatory Visit | Attending: Interventional Cardiology

## 2016-12-19 DIAGNOSIS — Z880 Allergy status to penicillin: Secondary | ICD-10-CM | POA: Diagnosis not present

## 2016-12-19 DIAGNOSIS — Z79899 Other long term (current) drug therapy: Secondary | ICD-10-CM | POA: Diagnosis not present

## 2016-12-19 DIAGNOSIS — N183 Chronic kidney disease, stage 3 unspecified: Secondary | ICD-10-CM | POA: Diagnosis present

## 2016-12-19 DIAGNOSIS — E785 Hyperlipidemia, unspecified: Secondary | ICD-10-CM | POA: Insufficient documentation

## 2016-12-19 DIAGNOSIS — I252 Old myocardial infarction: Secondary | ICD-10-CM | POA: Insufficient documentation

## 2016-12-19 DIAGNOSIS — I255 Ischemic cardiomyopathy: Secondary | ICD-10-CM | POA: Diagnosis present

## 2016-12-19 DIAGNOSIS — I25119 Atherosclerotic heart disease of native coronary artery with unspecified angina pectoris: Secondary | ICD-10-CM | POA: Diagnosis present

## 2016-12-19 DIAGNOSIS — Z905 Acquired absence of kidney: Secondary | ICD-10-CM | POA: Insufficient documentation

## 2016-12-19 DIAGNOSIS — Z951 Presence of aortocoronary bypass graft: Secondary | ICD-10-CM | POA: Diagnosis not present

## 2016-12-19 DIAGNOSIS — Z7984 Long term (current) use of oral hypoglycemic drugs: Secondary | ICD-10-CM | POA: Diagnosis not present

## 2016-12-19 DIAGNOSIS — I2582 Chronic total occlusion of coronary artery: Secondary | ICD-10-CM | POA: Diagnosis not present

## 2016-12-19 DIAGNOSIS — Z87891 Personal history of nicotine dependence: Secondary | ICD-10-CM | POA: Diagnosis not present

## 2016-12-19 DIAGNOSIS — Z7901 Long term (current) use of anticoagulants: Secondary | ICD-10-CM | POA: Diagnosis not present

## 2016-12-19 DIAGNOSIS — J982 Interstitial emphysema: Secondary | ICD-10-CM | POA: Diagnosis present

## 2016-12-19 DIAGNOSIS — I130 Hypertensive heart and chronic kidney disease with heart failure and stage 1 through stage 4 chronic kidney disease, or unspecified chronic kidney disease: Secondary | ICD-10-CM | POA: Diagnosis not present

## 2016-12-19 DIAGNOSIS — I5022 Chronic systolic (congestive) heart failure: Secondary | ICD-10-CM | POA: Diagnosis not present

## 2016-12-19 DIAGNOSIS — Z8679 Personal history of other diseases of the circulatory system: Secondary | ICD-10-CM

## 2016-12-19 DIAGNOSIS — E7800 Pure hypercholesterolemia, unspecified: Secondary | ICD-10-CM | POA: Diagnosis not present

## 2016-12-19 DIAGNOSIS — I4891 Unspecified atrial fibrillation: Secondary | ICD-10-CM | POA: Insufficient documentation

## 2016-12-19 DIAGNOSIS — I25810 Atherosclerosis of coronary artery bypass graft(s) without angina pectoris: Secondary | ICD-10-CM | POA: Diagnosis not present

## 2016-12-19 DIAGNOSIS — I5043 Acute on chronic combined systolic (congestive) and diastolic (congestive) heart failure: Secondary | ICD-10-CM

## 2016-12-19 DIAGNOSIS — Z9889 Other specified postprocedural states: Secondary | ICD-10-CM

## 2016-12-19 DIAGNOSIS — Z87442 Personal history of urinary calculi: Secondary | ICD-10-CM | POA: Insufficient documentation

## 2016-12-19 DIAGNOSIS — I25719 Atherosclerosis of autologous vein coronary artery bypass graft(s) with unspecified angina pectoris: Secondary | ICD-10-CM | POA: Diagnosis not present

## 2016-12-19 DIAGNOSIS — E1129 Type 2 diabetes mellitus with other diabetic kidney complication: Secondary | ICD-10-CM | POA: Diagnosis present

## 2016-12-19 HISTORY — PX: RIGHT/LEFT HEART CATH AND CORONARY/GRAFT ANGIOGRAPHY: CATH118267

## 2016-12-19 LAB — POCT I-STAT 3, VENOUS BLOOD GAS (G3P V)
Bicarbonate: 25.9 mmol/L (ref 20.0–28.0)
O2 SAT: 63 %
PCO2 VEN: 45.7 mmHg (ref 44.0–60.0)
PH VEN: 7.361 (ref 7.250–7.430)
PO2 VEN: 34 mmHg (ref 32.0–45.0)
TCO2: 27 mmol/L (ref 22–32)

## 2016-12-19 LAB — GLUCOSE, CAPILLARY
GLUCOSE-CAPILLARY: 160 mg/dL — AB (ref 65–99)
Glucose-Capillary: 143 mg/dL — ABNORMAL HIGH (ref 65–99)

## 2016-12-19 LAB — POCT I-STAT 3, ART BLOOD GAS (G3+)
Bicarbonate: 25.8 mmol/L (ref 20.0–28.0)
O2 Saturation: 94 %
PCO2 ART: 44.3 mmHg (ref 32.0–48.0)
PH ART: 7.373 (ref 7.350–7.450)
PO2 ART: 72 mmHg — AB (ref 83.0–108.0)
TCO2: 27 mmol/L (ref 22–32)

## 2016-12-19 SURGERY — RIGHT/LEFT HEART CATH AND CORONARY/GRAFT ANGIOGRAPHY
Anesthesia: LOCAL

## 2016-12-19 MED ORDER — ASPIRIN 81 MG PO CHEW: 81.0000 mg | CHEWABLE_TABLET | ORAL | Status: DC

## 2016-12-19 MED ORDER — IOPAMIDOL (ISOVUE-370) INJECTION 76%
INTRAVENOUS | Status: DC | PRN
  Administered 2016-12-19: 80 mL via INTRA_ARTERIAL

## 2016-12-19 MED ORDER — HYDRALAZINE HCL 20 MG/ML IJ SOLN
INTRAMUSCULAR | Status: AC
  Filled 2016-12-19: qty 1

## 2016-12-19 MED ORDER — HEPARIN (PORCINE) IN NACL 2-0.9 UNIT/ML-% IJ SOLN
INTRAMUSCULAR | Status: AC
  Filled 2016-12-19: qty 1000

## 2016-12-19 MED ORDER — ACETAMINOPHEN 325 MG PO TABS: 650.0000 mg | ORAL_TABLET | ORAL | Status: DC | PRN

## 2016-12-19 MED ORDER — FENTANYL CITRATE (PF) 100 MCG/2ML IJ SOLN
INTRAMUSCULAR | Status: AC
  Filled 2016-12-19: qty 2

## 2016-12-19 MED ORDER — LIDOCAINE HCL (PF) 1 % IJ SOLN
INTRAMUSCULAR | Status: DC | PRN
  Administered 2016-12-19: 18 mL

## 2016-12-19 MED ORDER — SODIUM CHLORIDE 0.9% FLUSH: 3.0000 mL | INTRAVENOUS | Status: DC | PRN

## 2016-12-19 MED ORDER — MIDAZOLAM HCL 2 MG/2ML IJ SOLN
INTRAMUSCULAR | Status: AC
  Filled 2016-12-19: qty 2

## 2016-12-19 MED ORDER — ONDANSETRON HCL 4 MG/2ML IJ SOLN: 4.0000 mg | Freq: Four times a day (QID) | INTRAMUSCULAR | Status: DC | PRN

## 2016-12-19 MED ORDER — HYDRALAZINE HCL 20 MG/ML IJ SOLN
INTRAMUSCULAR | Status: DC | PRN
  Administered 2016-12-19: 10 mg via INTRAVENOUS

## 2016-12-19 MED ORDER — SODIUM CHLORIDE 0.9% FLUSH: 3.0000 mL | Freq: Two times a day (BID) | INTRAVENOUS | Status: DC

## 2016-12-19 MED ORDER — SODIUM CHLORIDE 0.9 % WEIGHT BASED INFUSION
1.0000 mL/kg/h | INTRAVENOUS | Status: DC
Start: 2016-12-19 — End: 2016-12-19

## 2016-12-19 MED ORDER — IOPAMIDOL (ISOVUE-370) INJECTION 76%
INTRAVENOUS | Status: AC
  Filled 2016-12-19: qty 125

## 2016-12-19 MED ORDER — HEPARIN (PORCINE) IN NACL 2-0.9 UNIT/ML-% IJ SOLN
INTRAMUSCULAR | Status: AC | PRN
  Administered 2016-12-19: 1000 mL

## 2016-12-19 MED ORDER — LIDOCAINE HCL (PF) 1 % IJ SOLN
INTRAMUSCULAR | Status: AC
  Filled 2016-12-19: qty 30

## 2016-12-19 MED ORDER — MIDAZOLAM HCL 2 MG/2ML IJ SOLN
INTRAMUSCULAR | Status: DC | PRN
Start: 2016-12-19 — End: 2016-12-19
  Administered 2016-12-19 (×3): 1 mg via INTRAVENOUS

## 2016-12-19 MED ORDER — OXYCODONE HCL 5 MG PO TABS: 5.0000 mg | ORAL_TABLET | ORAL | Status: DC | PRN

## 2016-12-19 MED ORDER — SODIUM CHLORIDE 0.9 % IV SOLN: 250.0000 mL | INTRAVENOUS | Status: DC | PRN

## 2016-12-19 MED ORDER — SODIUM CHLORIDE 0.9 % WEIGHT BASED INFUSION
3.0000 mL/kg/h | INTRAVENOUS | Status: AC
  Administered 2016-12-19: 3 mL/kg/h via INTRAVENOUS

## 2016-12-19 MED ORDER — FENTANYL CITRATE (PF) 100 MCG/2ML IJ SOLN
INTRAMUSCULAR | Status: DC | PRN
  Administered 2016-12-19: 50 ug via INTRAVENOUS

## 2016-12-19 MED ORDER — SODIUM CHLORIDE 0.9 % IV SOLN: INTRAVENOUS | Status: AC

## 2016-12-19 SURGICAL SUPPLY — 14 items
CATH INFINITI 5 FR IM (CATHETERS) ×1 IMPLANT
CATH INFINITI 5FR JL4 (CATHETERS) ×1 IMPLANT
CATH INFINITI 5FR MPB2 (CATHETERS) ×1 IMPLANT
CATH INFINITI JR4 5F (CATHETERS) ×1 IMPLANT
CATH SWAN GANZ 7F STRAIGHT (CATHETERS) ×1 IMPLANT
COVER PRB 48X5XTLSCP FOLD TPE (BAG) IMPLANT
COVER PROBE 5X48 (BAG) ×2
GUIDEWIRE ANGLED .035X150CM (WIRE) ×1 IMPLANT
KIT HEART LEFT (KITS) ×2 IMPLANT
PACK CARDIAC CATHETERIZATION (CUSTOM PROCEDURE TRAY) ×2 IMPLANT
SHEATH PINNACLE 5F 10CM (SHEATH) ×1 IMPLANT
SHEATH PINNACLE 7F 10CM (SHEATH) ×1 IMPLANT
TRANSDUCER W/STOPCOCK (MISCELLANEOUS) ×2 IMPLANT
WIRE EMERALD 3MM-J .035X150CM (WIRE) ×1 IMPLANT

## 2016-12-19 NOTE — Progress Notes (Signed)
Site area: rt groin fa and fv sheaths Site Prior to Removal:  Level 0 Pressure Applied For: 20 minutes Manual:   yes Patient Status During Pull:  stable Post Pull Site:  Level 0 Post Pull Instructions Given:  yes Post Pull Pulses Present: palpable Dressing Applied:  Gauze and tegaderm Bedrest begins @ 1100 Comments:
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
## 2016-12-19 NOTE — Discharge Instructions (Signed)

## 2016-12-19 NOTE — Interval H&P Note (Signed)
Cath Lab Visit (complete for each Cath Lab visit)  Clinical Evaluation Leading to the Procedure:   ACS: No.  Non-ACS:    Anginal Classification: CCS III  Anti-ischemic medical therapy: Minimal Therapy (1 class of medications)  Non-Invasive Test Results: No non-invasive testing performed  Prior CABG: Previous CABG      History and Physical Interval Note:  12/19/2016 8:58 AM  [name]  has presented today for surgery, with the diagnosis of syncope, chronic systolic HF  The various methods of treatment have been discussed with the patient and family. After consideration of risks, benefits and other options for treatment, the patient has consented to  Procedure(s): RIGHT/LEFT HEART CATH AND CORONARY/GRAFT ANGIOGRAPHY (N/A) as a surgical intervention .  The patient's history has been reviewed, patient examined, no change in status, stable for surgery.  I have reviewed the patient's chart and labs.  Questions were answered to the patient's satisfaction.     Lyn Records III

## 2016-12-20 ENCOUNTER — Telehealth: Payer: Self-pay | Admitting: Cardiovascular Disease

## 2016-12-20 ENCOUNTER — Encounter (HOSPITAL_COMMUNITY): Payer: Self-pay | Admitting: Interventional Cardiology

## 2016-12-20 NOTE — Telephone Encounter (Signed)
Patient calling, states that he had a heart cath and was supposed to wear monitor for about another week or so. Patient would like to know if he could discontinue the heart monitor?

## 2016-12-20 NOTE — Telephone Encounter (Signed)
EOS 12/30/2016.  Patient instructed he may discontinue his monitor on that date.  He has EP appointment with Dr. Ladona Ridgel on 12/28/16.  Patient appreciative of call.

## 2016-12-28 ENCOUNTER — Encounter: Payer: Self-pay | Admitting: Internal Medicine

## 2016-12-28 ENCOUNTER — Ambulatory Visit (INDEPENDENT_AMBULATORY_CARE_PROVIDER_SITE_OTHER): Payer: Medicare Other | Admitting: Internal Medicine

## 2016-12-28 VITALS — BP 142/84 | HR 59 | Ht 68.0 in | Wt 163.4 lb

## 2016-12-28 DIAGNOSIS — I5022 Chronic systolic (congestive) heart failure: Secondary | ICD-10-CM

## 2016-12-28 DIAGNOSIS — R55 Syncope and collapse: Secondary | ICD-10-CM

## 2016-12-28 NOTE — Patient Instructions (Addendum)
Medication Instructions:  ?Your physician recommends that you continue on your current medications as directed. Please refer to the Current Medication list given to you today. ? ?Labwork: ?None ordered. ? ?Testing/Procedures: ?None ordered. ? ?Follow-Up: ?Your physician wants you to follow-up as needed ? ? ?Any Other Special Instructions Will Be Listed Below (If Applicable). ? ?If you need a refill on your cardiac medications before your next appointment, please call your pharmacy.  ? ? ? ? ?

## 2016-12-28 NOTE — Progress Notes (Signed)
HPI [name] is referred today to consider prophylactic ICD insertion. The patient is an 83 year old man with an ischemic cardiomyopathy, class II systolic heart failure, who underwent 2-D echo one month ago demonstrating a reduction in left ventricular systolic function. His EF had decreased from 35-40% down to 25-30%. He underwent left heart catheterization which demonstrated occluded vein graft to the marginal and diagonal branches, severe native three-vessel coronary disease, with patent LIMA to the LAD and patent vein graft to the right coronary artery. The patient feels well. He has no chest pain. He has not had syncope. While he has slowed down over the years, he still remains fairly active. Allergies  Allergen Reactions  . Ace Inhibitors Other (See Comments)    Renal insuffiencey  . Atorvastatin Other (See Comments)    Intolerant to many statins, muscle pain  . Crestor [Rosuvastatin Calcium] Other (See Comments)    Muscle pain  . Zetia [Ezetimibe]     Leg cramps  . Lopid [Gemfibrozil] Other (See Comments)    unknown  . Penicillins Other (See Comments)    unknown     Current Outpatient Prescriptions  Medication Sig Dispense Refill  . ACCU-CHEK SMARTVIEW test strip Use as directed to test blood glucose each morning.    Marland Kitchen allopurinol (ZYLOPRIM) 300 MG tablet Take 300 mg by mouth daily.    Marland Kitchen apixaban (ELIQUIS) 2.5 MG TABS tablet Take 2.5 mg by mouth 2 (two) times daily.    . carvedilol (COREG) 6.25 MG tablet Take 1 tablet (6.25 mg total) by mouth 2 (two) times daily. 60 tablet 11  . cyanocobalamin 500 MCG tablet Take 500 mcg by mouth daily.     Marland Kitchen glimepiride (AMARYL) 1 MG tablet Take 1 mg by mouth daily with lunch.     . hydrALAZINE (APRESOLINE) 10 MG tablet TAKE 1 TABLET BY MOUTH THREE TIMES DAILY 270 tablet 3  . isosorbide mononitrate (IMDUR) 30 MG 24 hr tablet Take 1 tablet (30 mg total) by mouth daily. 30 tablet 10  . levothyroxine (SYNTHROID, LEVOTHROID) 100 MCG  tablet Take 100 mcg by mouth daily before breakfast.    . Menthol, Topical Analgesic, (BLUE-EMU MAXIMUM STRENGTH EX) Apply 1 application topically 3 (three) times daily as needed (for knee pain.). For sore muscles.     . metFORMIN (GLUCOPHAGE) 500 MG tablet Take 500 mg by mouth 2 (two) times daily.     . Multiple Vitamins-Minerals (PRESERVISION AREDS 2) CAPS Take 1 capsule by mouth 2 (two) times daily.    Bertram Gala Glycol-Propyl Glycol (SYSTANE ULTRA) 0.4-0.3 % SOLN Place 1 drop into both eyes daily.    . Vitamin D, Ergocalciferol, (DRISDOL) 50000 units CAPS capsule Take 50,000 Units by mouth every Wednesday.     No current facility-administered medications for this visit.      Past Medical History:  Diagnosis Date  . AAA (abdominal aortic aneurysm) [name] 2003   s/p AAA R&G June 2003  . CAD (coronary artery disease) 2003   s/p CABG, 2003   . CHF (congestive heart failure) (HCC)    EF 35-40% by echo, May 2017  . Chronic renal insufficiency, stage III (moderate)   . HTN (hypertension)   . Hypercholesteremia    statin intol  . MI (myocardial infarction) (HCC)   . SBO (small bowel obstruction) (HCC) Nov 2002    ROS:   All systems reviewed and negative except as noted in the HPI.   Past Surgical History:  Procedure Laterality  Date  . ABDOMINAL AORTIC ANEURYSM REPAIR  June 2003  . CARDIOVERSION N/A 09/08/2015   Procedure: CARDIOVERSION;  Surgeon: [name], MD;  [facility];  Service: Cardiovascular;  Laterality: N/A;  . CORONARY ARTERY BYPASS GRAFT  March 2003   x 4  . HERNIA REPAIR    . NEPHRECTOMY Left    1955  . RIGHT/LEFT HEART CATH AND CORONARY/GRAFT ANGIOGRAPHY N/A 12/19/2016   Procedure: RIGHT/LEFT HEART CATH AND CORONARY/GRAFT ANGIOGRAPHY;  Surgeon: [name], [name], MD;  Location: [facility] INVASIVE CV LAB;  Service: Cardiovascular;  Laterality: N/A;  . TEE WITHOUT CARDIOVERSION N/A 09/08/2015   Procedure: TRANSESOPHAGEAL ECHOCARDIOGRAM (TEE);  Surgeon: Laurey Moralealton  S McLean, MD;  [facility];  Service: Cardiovascular;  Laterality: N/A;     Family History  Problem Relation Age of Onset  . Alzheimer's disease Mother   . Diabetes Mother   . Gallbladder disease Mother   . Cancer - Cervical Mother   . Coronary artery disease Father   . Diabetes Father      Social History   Social History  . Marital status: Married    Spouse name: N/A  . Number of children: N/A  . Years of education: N/A   Occupational History  . Not on file.   Social History Main Topics  . Smoking status: Former Smoker    Packs/day: 1.00    Years: 40.00    Types: Cigarettes    Quit date: 04/25/1995  . Smokeless tobacco: Never Used  . Alcohol use No  . Drug use: No  . Sexual activity: Not on file   Other Topics Concern  . Not on file   Social History Narrative  . No narrative on file     BP (!) 142/84   Pulse (!) 59   Ht 5\' 8"  (1.727 m)   Wt 163 lb 6.4 oz (74.1 kg)   SpO2 97%   BMI 24.84 kg/m   Physical Exam:  Well appearing 83 year old man, NAD HEENT: Unremarkable Neck:  6 cm JVD, no thyromegally Lymphatics:  No adenopathy Back:  No CVA tenderness Lungs:  Clear, with no wheezes, rales, or rhonchi HEART:  Regular rate rhythm, no murmurs, no rubs, no clicks Abd:  soft, positive bowel sounds, no organomegally, no rebound, no guarding Ext:  2 plus pulses, no edema, no cyanosis, no clubbing Skin:  No rashes no nodules Neuro:  CN II through XII intact, motor grossly intact  EKG - normal sinus rhythm with left ventricular hypertrophy   Assess/Plan: 1. Chronic systolic heart failure - the patient's symptoms are class II. He is on good medical therapy, although his blood pressure is elevated. I will defer decision to add an aldosterone antagonist to his primary cardiologist. We discussed prophylactic ICD implantation in detail. The patient's advanced age prevents us from making a recommendation about ICD insertion for primary prevention of  malignant ventricular arrhythmias. I told the patient that the procedure was an option for him but that we did not have any good data directing us on what the best treatment was in the primary prevention setting for an 83 year old. For now I would recommend continued medical therapy. If the patient developed syncope or had hemodynamically unstable ventricular tachycardia, then ICD insertion would be a consideration.  2. Dyslipidemia - he will continue his statin therapy. 3. Coronary artery disease - he is status post recent cardiac catheterization. He denies anginal symptoms.  Lewayne BuntingGregg Taylor, M.D.

## 2016-12-29 ENCOUNTER — Other Ambulatory Visit: Payer: Self-pay | Admitting: Cardiovascular Disease

## 2017-01-04 ENCOUNTER — Telehealth: Payer: Self-pay | Admitting: *Deleted

## 2017-01-04 NOTE — Telephone Encounter (Addendum)
Patient request patient assistance forms for [facility], will mail him [employer] forms. I have spoken with him on phone and informed him.     Have put physician part in Dr Elease HashimotoNahser box, he is back in office 01/15/2017. Patient states he has a couple months supply in home.

## 2017-01-08 NOTE — Telephone Encounter (Signed)
Paperwork signed by Dr. [name] and placed on desk of Larita Fife Via, LPN.

## 2017-01-09 NOTE — Telephone Encounter (Signed)
**Note De-Identified Elijah Saunders Obfuscation** Awaiting pt assistance application from pt and will fax all in together to [employer].

## 2017-01-18 ENCOUNTER — Other Ambulatory Visit: Payer: Self-pay

## 2017-01-18 MED ORDER — APIXABAN 2.5 MG PO TABS: 2.5000 mg | ORAL_TABLET | Freq: Two times a day (BID) | ORAL | 3 refills | Status: DC

## 2017-01-18 NOTE — Telephone Encounter (Signed)
The pt mailed his pt assistance application back to the office. He did not include his out of pocket expense report from his pharmacy. I have faxed application to [employer].

## 2017-02-05 ENCOUNTER — Ambulatory Visit (INDEPENDENT_AMBULATORY_CARE_PROVIDER_SITE_OTHER): Payer: Medicare Other | Admitting: Ophthalmology

## 2017-02-05 DIAGNOSIS — H33302 Unspecified retinal break, left eye: Secondary | ICD-10-CM

## 2017-02-05 DIAGNOSIS — E113293 Type 2 diabetes mellitus with mild nonproliferative diabetic retinopathy without macular edema, bilateral: Secondary | ICD-10-CM | POA: Diagnosis not present

## 2017-02-05 DIAGNOSIS — H43813 Vitreous degeneration, bilateral: Secondary | ICD-10-CM | POA: Diagnosis not present

## 2017-02-05 DIAGNOSIS — E11319 Type 2 diabetes mellitus with unspecified diabetic retinopathy without macular edema: Secondary | ICD-10-CM | POA: Diagnosis not present

## 2017-02-05 DIAGNOSIS — H34212 Partial retinal artery occlusion, left eye: Secondary | ICD-10-CM | POA: Diagnosis not present

## 2017-02-05 DIAGNOSIS — H35033 Hypertensive retinopathy, bilateral: Secondary | ICD-10-CM

## 2017-02-05 DIAGNOSIS — I10 Essential (primary) hypertension: Secondary | ICD-10-CM | POA: Diagnosis not present

## 2017-02-05 DIAGNOSIS — H353132 Nonexudative age-related macular degeneration, bilateral, intermediate dry stage: Secondary | ICD-10-CM

## 2017-02-05 DIAGNOSIS — H2513 Age-related nuclear cataract, bilateral: Secondary | ICD-10-CM

## 2017-02-06 ENCOUNTER — Telehealth: Payer: Self-pay | Admitting: Nurse Practitioner

## 2017-02-06 ENCOUNTER — Telehealth: Payer: Self-pay | Admitting: Cardiovascular Disease

## 2017-02-06 DIAGNOSIS — E782 Mixed hyperlipidemia: Secondary | ICD-10-CM

## 2017-02-06 DIAGNOSIS — I10 Essential (primary) hypertension: Secondary | ICD-10-CM

## 2017-02-06 NOTE — Telephone Encounter (Signed)
Spoke with patient regarding note from Dr. [name] to order carotid duplex and fasting lab work due to plaque in retinal artery seen during eye exam. Patient verbalized understanding and agreement with plan to follow-up after lab work, possibly with lipid clinic. He is aware that someone will call him to schedule the carotid u/s. He is scheduled for fasting lab work on Friday 10/19. He thanked me for the call.

## 2017-02-06 NOTE — Telephone Encounter (Signed)
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   Will make that decision after the lipid levels return     [name], MD  02/06/2017 1:30 PM    [facility] [address] [place],  [address] [place], [place]  [address] Pager [phone] Phone: [phone]; Fax: [phone]

## 2017-02-08 ENCOUNTER — Other Ambulatory Visit: Payer: Self-pay | Admitting: Cardiovascular Disease

## 2017-02-08 ENCOUNTER — Telehealth: Payer: Self-pay | Admitting: *Deleted

## 2017-02-08 DIAGNOSIS — I6523 Occlusion and stenosis of bilateral carotid arteries: Secondary | ICD-10-CM

## 2017-02-08 NOTE — Telephone Encounter (Signed)
Received letter from [employer] regarding patient assistance for [employer]. The letter states he was denied because "product covered by insurance".   Spoke with patient and he had received this information. He called [employer] and spoke with them regarding the deniel, they have ask for him to fax them all out of pocket medication prices from pharmacy. I suppose they will review this and maybe change their decision, that is what patient is thinking also. Will await answer on this.

## 2017-02-09 ENCOUNTER — Other Ambulatory Visit: Payer: Medicare Other | Admitting: *Deleted

## 2017-02-09 DIAGNOSIS — I10 Essential (primary) hypertension: Secondary | ICD-10-CM

## 2017-02-09 DIAGNOSIS — E782 Mixed hyperlipidemia: Secondary | ICD-10-CM

## 2017-02-09 LAB — BASIC METABOLIC PANEL
BUN / CREAT RATIO: 15 (ref 10–24)
BUN: 18 mg/dL (ref 8–27)
CHLORIDE: 101 mmol/L (ref 96–106)
CO2: 23 mmol/L (ref 20–29)
Calcium: 10 mg/dL (ref 8.6–10.2)
Creatinine, Ser: 1.24 mg/dL (ref 0.76–1.27)
GFR calc Af Amer: 63 mL/min/{1.73_m2} (ref >59)
GFR calc non Af Amer: 55 mL/min/{1.73_m2} — ABNORMAL LOW (ref >59)
GLUCOSE: 130 mg/dL — AB (ref 65–99)
Potassium: 4.4 mmol/L (ref 3.5–5.2)
SODIUM: 142 mmol/L (ref 134–144)

## 2017-02-09 LAB — HEPATIC FUNCTION PANEL
ALT: 10 IU/L (ref 0–44)
AST: 15 IU/L (ref 0–40)
Albumin: 4.6 g/dL (ref 3.5–4.7)
Alkaline Phosphatase: 80 IU/L (ref 39–117)
Bilirubin Total: 0.6 mg/dL (ref 0.0–1.2)
Bilirubin, Direct: 0.2 mg/dL (ref 0.00–0.40)
TOTAL PROTEIN: 7.4 g/dL (ref 6.0–8.5)

## 2017-02-09 LAB — LIPID PANEL
CHOL/HDL RATIO: 5.8 ratio — AB (ref 0.0–5.0)
Cholesterol, Total: 186 mg/dL (ref 100–199)
HDL: 32 mg/dL — AB (ref >39)
LDL Calculated: 121 mg/dL — ABNORMAL HIGH (ref 0–99)
Triglycerides: 167 mg/dL — ABNORMAL HIGH (ref 0–149)
VLDL CHOLESTEROL CAL: 33 mg/dL (ref 5–40)

## 2017-02-14 ENCOUNTER — Ambulatory Visit (HOSPITAL_COMMUNITY)
Admission: RE | Admit: 2017-02-14 | Discharge: 2017-02-14 | Disposition: A | Discharge status: Home / Self Care | Payer: Medicare Other | Source: Ambulatory Visit | Attending: Cardiology | Admitting: Cardiology

## 2017-02-14 DIAGNOSIS — I6523 Occlusion and stenosis of bilateral carotid arteries: Secondary | ICD-10-CM | POA: Diagnosis not present

## 2017-03-26 ENCOUNTER — Ambulatory Visit: Payer: Medicare Other | Admitting: Cardiovascular Disease

## 2017-03-26 ENCOUNTER — Encounter: Payer: Self-pay | Admitting: Cardiovascular Disease

## 2017-03-26 VITALS — BP 120/70 | HR 85 | Ht 68.0 in | Wt 164.0 lb

## 2017-03-26 DIAGNOSIS — I10 Essential (primary) hypertension: Secondary | ICD-10-CM

## 2017-03-26 DIAGNOSIS — I481 Persistent atrial fibrillation: Secondary | ICD-10-CM

## 2017-03-26 DIAGNOSIS — I5022 Chronic systolic (congestive) heart failure: Secondary | ICD-10-CM | POA: Diagnosis not present

## 2017-03-26 DIAGNOSIS — I2510 Atherosclerotic heart disease of native coronary artery without angina pectoris: Secondary | ICD-10-CM

## 2017-03-26 DIAGNOSIS — I4819 Other persistent atrial fibrillation: Secondary | ICD-10-CM
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
## 2017-03-26 NOTE — Patient Instructions (Signed)

## 2017-05-04 ENCOUNTER — Other Ambulatory Visit: Payer: Self-pay | Admitting: Cardiovascular Disease

## 2017-05-07 ENCOUNTER — Other Ambulatory Visit: Payer: Self-pay | Admitting: *Deleted

## 2017-05-07 MED ORDER — APIXABAN 2.5 MG PO TABS: 2.5000 mg | ORAL_TABLET | Freq: Two times a day (BID) | ORAL | 1 refills | Status: DC

## 2017-06-04 ENCOUNTER — Other Ambulatory Visit: Payer: Self-pay | Admitting: Cardiovascular Disease

## 2017-06-04 DIAGNOSIS — I2510 Atherosclerotic heart disease of native coronary artery without angina pectoris: Secondary | ICD-10-CM

## 2017-06-04 DIAGNOSIS — I5022 Chronic systolic (congestive) heart failure: Secondary | ICD-10-CM

## 2017-10-23 ENCOUNTER — Ambulatory Visit: Payer: Medicare Other | Admitting: Cardiovascular Disease

## 2017-10-23 ENCOUNTER — Other Ambulatory Visit: Payer: Medicare Other | Admitting: *Deleted

## 2017-10-23 ENCOUNTER — Encounter (INDEPENDENT_AMBULATORY_CARE_PROVIDER_SITE_OTHER): Payer: Self-pay

## 2017-10-23 ENCOUNTER — Encounter: Payer: Self-pay | Admitting: Cardiovascular Disease

## 2017-10-23 VITALS — BP 126/78 | HR 100 | Ht 68.0 in | Wt 157.0 lb

## 2017-10-23 DIAGNOSIS — I2510 Atherosclerotic heart disease of native coronary artery without angina pectoris: Secondary | ICD-10-CM

## 2017-10-23 DIAGNOSIS — Z951 Presence of aortocoronary bypass graft: Secondary | ICD-10-CM | POA: Diagnosis not present

## 2017-10-23 DIAGNOSIS — I255 Ischemic cardiomyopathy: Secondary | ICD-10-CM

## 2017-10-23 DIAGNOSIS — I481 Persistent atrial fibrillation: Secondary | ICD-10-CM | POA: Diagnosis not present

## 2017-10-23 DIAGNOSIS — I5022 Chronic systolic (congestive) heart failure: Secondary | ICD-10-CM

## 2017-10-23 DIAGNOSIS — I10 Essential (primary) hypertension: Secondary | ICD-10-CM

## 2017-10-23 DIAGNOSIS — I4819 Other persistent atrial fibrillation: Secondary | ICD-10-CM

## 2017-10-23 LAB — BASIC METABOLIC PANEL
BUN/Creatinine Ratio: 18 (ref 10–24)
BUN: 23 mg/dL (ref 8–27)
CALCIUM: 10.4 mg/dL — AB (ref 8.6–10.2)
CHLORIDE: 99 mmol/L (ref 96–106)
CO2: 23 mmol/L (ref 20–29)
Creatinine, Ser: 1.29 mg/dL — ABNORMAL HIGH (ref 0.76–1.27)
GFR calc Af Amer: 60 mL/min/{1.73_m2} (ref >59)
GFR calc non Af Amer: 52 mL/min/{1.73_m2} — ABNORMAL LOW (ref >59)
Glucose: 111 mg/dL — ABNORMAL HIGH (ref 65–99)
POTASSIUM: 4.6 mmol/L (ref 3.5–5.2)
Sodium: 141 mmol/L (ref 134–144)

## 2017-10-23 LAB — HEPATIC FUNCTION PANEL
ALT: 7 IU/L (ref 0–44)
AST: 10 IU/L (ref 0–40)
Albumin: 4.5 g/dL (ref 3.5–4.7)
Alkaline Phosphatase: 74 IU/L (ref 39–117)
BILIRUBIN, DIRECT: 0.25 mg/dL (ref 0.00–0.40)
Bilirubin Total: 0.8 mg/dL (ref 0.0–1.2)
Total Protein: 7.1 g/dL (ref 6.0–8.5)

## 2017-10-23 LAB — LIPID PANEL
Chol/HDL Ratio: 6 ratio — ABNORMAL HIGH (ref 0.0–5.0)
Cholesterol, Total: 175 mg/dL (ref 100–199)
HDL: 29 mg/dL — AB (ref >39)
LDL Calculated: 109 mg/dL — ABNORMAL HIGH (ref 0–99)
TRIGLYCERIDES: 183 mg/dL — AB (ref 0–149)
VLDL CHOLESTEROL CAL: 37 mg/dL (ref 5–40)

## 2017-10-23 NOTE — Patient Instructions (Signed)
Medication Instructions:  Your physician recommends that you continue on your current medications as directed. Please refer to the Current Medication list given to you today.   Labwork: TODAY - cholesterol, liver panel, basic metabolic panel   Testing/Procedures: None Ordered   Follow-Up: Your physician wants you to follow-up in: 6 months with Dr. Nahser. You will receive a reminder letter in the mail two months in advance. If you don't receive a letter, please call our office to schedule the follow-up appointment.   If you need a refill on your cardiac medications before your next appointment, please call your pharmacy.   Thank you for choosing [facility]! Anita Mcadory, RN [phone]    
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
## 2017-10-24 ENCOUNTER — Other Ambulatory Visit: Payer: Self-pay | Admitting: Nurse Practitioner

## 2017-10-24 DIAGNOSIS — E782 Mixed hyperlipidemia: Secondary | ICD-10-CM

## 2017-10-24 DIAGNOSIS — Z951 Presence of aortocoronary bypass graft: Secondary | ICD-10-CM

## 2017-11-05 ENCOUNTER — Encounter (INDEPENDENT_AMBULATORY_CARE_PROVIDER_SITE_OTHER): Payer: Medicare Other | Admitting: Ophthalmology

## 2017-11-05 DIAGNOSIS — H2513 Age-related nuclear cataract, bilateral: Secondary | ICD-10-CM

## 2017-11-05 DIAGNOSIS — H43813 Vitreous degeneration, bilateral: Secondary | ICD-10-CM

## 2017-11-05 DIAGNOSIS — I10 Essential (primary) hypertension: Secondary | ICD-10-CM

## 2017-11-05 DIAGNOSIS — H33302 Unspecified retinal break, left eye: Secondary | ICD-10-CM

## 2017-11-05 DIAGNOSIS — E113293 Type 2 diabetes mellitus with mild nonproliferative diabetic retinopathy without macular edema, bilateral: Secondary | ICD-10-CM

## 2017-11-05 DIAGNOSIS — E11319 Type 2 diabetes mellitus with unspecified diabetic retinopathy without macular edema: Secondary | ICD-10-CM | POA: Diagnosis not present

## 2017-11-05 DIAGNOSIS — H353132 Nonexudative age-related macular degeneration, bilateral, intermediate dry stage: Secondary | ICD-10-CM | POA: Diagnosis not present

## 2017-11-05 DIAGNOSIS — H35033 Hypertensive retinopathy, bilateral: Secondary | ICD-10-CM

## 2017-12-14 ENCOUNTER — Other Ambulatory Visit: Payer: Self-pay | Admitting: Cardiovascular Disease

## 2017-12-14 DIAGNOSIS — I5022 Chronic systolic (congestive) heart failure: Secondary | ICD-10-CM

## 2017-12-14 DIAGNOSIS — I2510 Atherosclerotic heart disease of native coronary artery without angina pectoris: Secondary | ICD-10-CM

## 2017-12-31 ENCOUNTER — Other Ambulatory Visit: Payer: Self-pay | Admitting: Cardiovascular Disease

## 2018-01-01 ENCOUNTER — Telehealth: Payer: Self-pay | Admitting: Cardiovascular Disease

## 2018-01-01 NOTE — Telephone Encounter (Signed)
New Message:     Patient calling about his eliqusi  patient would like to know if there is something he could get because that medication is to high for to pay, it has gone up

## 2018-01-02 NOTE — Telephone Encounter (Signed)
Spoke with patient's wife who states patient called to discuss price of Eliquis which has gone up to $148 per month, previously $46 per month. Wife states their daughter called [facility] and was told that our office could complete PA for patient to lower cost. I see in patient's chart that this was done Sept. 2018 as well and it likely has expired. I advised that I will forward message to our Patient Care Advocates and that one of them will call the patient to discuss completing this paperwork. Patient's wife verbalized understanding and thanked me for the call.

## 2018-01-02 NOTE — Telephone Encounter (Signed)
The pts wife states that the pt is in the donut hole and that is why his Eliquis has gone up in price. I offered to either mail them a [employer] pt asst application, they could come by the office to pick it up or they could print an application from [employer] web page. She asked that I mail it to them which I have done.  Also, the pts wife states that they will call to let me know if they mail the pts part to [employer] so I can fax in the provider part.

## 2018-01-08 ENCOUNTER — Telehealth: Payer: Self-pay | Admitting: Cardiovascular Disease

## 2018-01-08 NOTE — Telephone Encounter (Signed)
The pts wife called to let me know that her son is faxing the pts part of his [employer] pt asst application in tomorrow morning and she is requesting that I fax in the providers part tomorrow morning as well.

## 2018-01-08 NOTE — Telephone Encounter (Signed)
New Message:    Patient has additional questions about the [employer] pt asst application.

## 2018-01-08 NOTE — Telephone Encounter (Signed)
**Note De-Identified Christerpher Clos Obfuscation** The pts wife wants me to know that her son is faxing the pts part of his [employer] pt asst application in tomorrow morning and she is requesting that I fax in the providers part tomorrow morning as well.

## 2018-01-09 NOTE — Telephone Encounter (Signed)
I have faxed the provider part of the application to [employer] with a note on the cover letter to add with the pts application that has already been faxed to them. 

## 2018-01-09 NOTE — Telephone Encounter (Signed)
**Note De-Identified Elijah Saunders Obfuscation** I have faxed the provider part of the application to [employer] with a note on the cover letter to add with the pts application that has already been faxed to them.

## 2018-01-14 ENCOUNTER — Other Ambulatory Visit: Payer: Self-pay

## 2018-01-14 ENCOUNTER — Emergency Department (HOSPITAL_COMMUNITY): Payer: Medicare Other

## 2018-01-14 ENCOUNTER — Encounter (HOSPITAL_COMMUNITY): Payer: Self-pay

## 2018-01-14 ENCOUNTER — Emergency Department (HOSPITAL_COMMUNITY)
Admission: EM | Admit: 2018-01-14 | Discharge: 2018-01-14 | Disposition: A | Discharge status: Home / Self Care | Payer: Medicare Other | Attending: Emergency Medicine | Admitting: Emergency Medicine

## 2018-01-14 DIAGNOSIS — E7800 Pure hypercholesterolemia, unspecified: Secondary | ICD-10-CM | POA: Diagnosis not present

## 2018-01-14 DIAGNOSIS — Z7984 Long term (current) use of oral hypoglycemic drugs: Secondary | ICD-10-CM | POA: Insufficient documentation

## 2018-01-14 DIAGNOSIS — I130 Hypertensive heart and chronic kidney disease with heart failure and stage 1 through stage 4 chronic kidney disease, or unspecified chronic kidney disease: Secondary | ICD-10-CM | POA: Insufficient documentation

## 2018-01-14 DIAGNOSIS — Z87891 Personal history of nicotine dependence: Secondary | ICD-10-CM | POA: Diagnosis not present

## 2018-01-14 DIAGNOSIS — Z7901 Long term (current) use of anticoagulants: Secondary | ICD-10-CM | POA: Diagnosis not present

## 2018-01-14 DIAGNOSIS — I509 Heart failure, unspecified: Secondary | ICD-10-CM

## 2018-01-14 DIAGNOSIS — I2510 Atherosclerotic heart disease of native coronary artery without angina pectoris: Secondary | ICD-10-CM | POA: Diagnosis not present

## 2018-01-14 DIAGNOSIS — I5023 Acute on chronic systolic (congestive) heart failure: Secondary | ICD-10-CM | POA: Insufficient documentation

## 2018-01-14 DIAGNOSIS — E1122 Type 2 diabetes mellitus with diabetic chronic kidney disease: Secondary | ICD-10-CM | POA: Insufficient documentation

## 2018-01-14 DIAGNOSIS — I252 Old myocardial infarction: Secondary | ICD-10-CM | POA: Diagnosis not present

## 2018-01-14 DIAGNOSIS — I481 Persistent atrial fibrillation: Secondary | ICD-10-CM | POA: Diagnosis not present

## 2018-01-14 DIAGNOSIS — R0602 Shortness of breath: Secondary | ICD-10-CM | POA: Diagnosis present

## 2018-01-14 DIAGNOSIS — N183 Chronic kidney disease, stage 3 (moderate): Secondary | ICD-10-CM | POA: Insufficient documentation

## 2018-01-14 DIAGNOSIS — Z79899 Other long term (current) drug therapy: Secondary | ICD-10-CM | POA: Diagnosis not present

## 2018-01-14 LAB — BASIC METABOLIC PANEL
ANION GAP: 11 (ref 5–15)
BUN: 29 mg/dL — ABNORMAL HIGH (ref 8–23)
CALCIUM: 10.3 mg/dL (ref 8.9–10.3)
CO2: 24 mmol/L (ref 22–32)
Chloride: 102 mmol/L (ref 98–111)
Creatinine, Ser: 1.48 mg/dL — ABNORMAL HIGH (ref 0.61–1.24)
GFR, EST AFRICAN AMERICAN: 49 mL/min — AB (ref >60)
GFR, EST NON AFRICAN AMERICAN: 43 mL/min — AB (ref >60)
Glucose, Bld: 148 mg/dL — ABNORMAL HIGH (ref 70–99)
Potassium: 4 mmol/L (ref 3.5–5.1)
SODIUM: 137 mmol/L (ref 135–145)

## 2018-01-14 LAB — CBC
HCT: 36.7 % — ABNORMAL LOW (ref 39.0–52.0)
HEMOGLOBIN: 11.8 g/dL — AB (ref 13.0–17.0)
MCH: 30.5 pg (ref 26.0–34.0)
MCHC: 32.2 g/dL (ref 30.0–36.0)
MCV: 94.8 fL (ref 78.0–100.0)
Platelets: 270 10*3/uL (ref 150–400)
RBC: 3.87 MIL/uL — AB (ref 4.22–5.81)
RDW: 16.7 % — ABNORMAL HIGH (ref 11.5–15.5)
WBC: 10.6 10*3/uL — ABNORMAL HIGH (ref 4.0–10.5)

## 2018-01-14 LAB — BRAIN NATRIURETIC PEPTIDE: B Natriuretic Peptide: 1223.3 pg/mL — ABNORMAL HIGH (ref 0.0–100.0)

## 2018-01-14 LAB — I-STAT TROPONIN, ED: TROPONIN I, POC: 0.02 ng/mL (ref 0.00–0.08)

## 2018-01-14 NOTE — ED Provider Notes (Signed)
[facility] EMERGENCY DEPARTMENT Provider Note   CSN: [id number] Arrival date & time: 01/14/18  1324     History   Chief Complaint Chief Complaint  Patient presents with  . Shortness of Breath    HPI Elijah Saunders is a 84 y.o. male.  84 year old male with prior medical history as detailed below presents with complaint of shortness of breath.  Patient reports long-standing issues with mild shortness of breath.  Symptoms have been ongoing for at least the last month.  Patient has been seen by his regular doctor within the last 2 weeks who did not feel that there is an acute process involved.  Patient reports compliance with his Lasix except for yesterday when he skipped a dose.  He denies associated chest pain or fever.  He denies cough.  The history is provided by the patient, medical records and a relative.  Shortness of Breath  This is a chronic problem. The average episode lasts 3 weeks. The problem occurs intermittently.The current episode started more than 1 week ago. The problem has not changed since onset.Pertinent negatives include no fever, no cough, no chest pain, no syncope, no vomiting, no leg pain, no leg swelling and no claudication.    Past Medical History:  Diagnosis Date  . AAA (abdominal aortic aneurysm) [facility]) 2003   s/p AAA R&G June 2003  . CAD (coronary artery disease) 2003   s/p CABG, 2003   . CHF (congestive heart failure) (HCC)    EF 35-40% by echo, May 2017  . Chronic renal insufficiency, stage III (moderate) (HCC)   . HTN (hypertension)   . Hypercholesteremia    statin intol  . MI (myocardial infarction) (HCC)   . SBO (small bowel obstruction) [facility]) Nov 2002    Patient Active Problem List   Diagnosis Date Noted  . Near syncope 12/14/2016  . Orthostatic hypotension 12/30/2015  . Dyspnea   . Chronic renal disease, stage III (HCC) 09/06/2015  . Type 2 diabetes mellitus with renal manifestations, controlled (HCC) 09/06/2015  .  Cardiomyopathy, ischemic-EF 35-40% 09/06/2015  . ACE inhibitor intolerance 09/06/2015  . Persistent atrial fibrillation (HCC) 09/06/2015  . Emphysema, interstitial (HCC) 09/06/2015  . S/P AAA repair 2003 02/13/2011  . Chronic systolic CHF (congestive heart failure) (HCC) 02/13/2011  . Hx of CABG x 4 2003 08/16/2010  . Hyperlipidemia-statin intol 08/16/2010    Past Surgical History:  Procedure Laterality Date  . ABDOMINAL AORTIC ANEURYSM REPAIR  June 2003  . CARDIOVERSION N/A 09/08/2015   Procedure: CARDIOVERSION;  Surgeon: Laurey Morale, MD;  Location: [facility] ENDOSCOPY;  Service: Cardiovascular;  Laterality: N/A;  . CORONARY ARTERY BYPASS GRAFT  March 2003   x 4  . HERNIA REPAIR    . NEPHRECTOMY Left    1955  . RIGHT/LEFT HEART CATH AND CORONARY/GRAFT ANGIOGRAPHY N/A 12/19/2016   Procedure: RIGHT/LEFT HEART CATH AND CORONARY/GRAFT ANGIOGRAPHY;  Surgeon: Lyn Records, MD;  Location: [facility] INVASIVE CV LAB;  Service: Cardiovascular;  Laterality: N/A;  . TEE WITHOUT CARDIOVERSION N/A 09/08/2015   Procedure: TRANSESOPHAGEAL ECHOCARDIOGRAM (TEE);  Surgeon: Laurey Morale, MD;  Location: [facility] ENDOSCOPY;  Service: Cardiovascular;  Laterality: N/A;        Home Medications    Prior to Admission medications   Medication Sig Start Date End Date Taking? Authorizing Provider  allopurinol (ZYLOPRIM) 300 MG tablet Take 300 mg by mouth daily. 08/23/15  Yes [provider]  apixaban (ELIQUIS) 2.5 MG TABS tablet Take 1 tablet (2.5  mg total) by mouth 2 (two) times daily. 05/07/17  Yes Nahser, Deloris Ping, MD  carvedilol (COREG) 6.25 MG tablet TAKE 1 TABLET BY MOUTH TWICE DAILY 01/01/18  Yes Nahser, Deloris Ping, MD  cyanocobalamin 500 MCG tablet Take 500 mcg by mouth daily.    Yes [provider]  furosemide (LASIX) 20 MG tablet Take 1 tablet by mouth daily. 10/03/17  Yes [provider]  glimepiride (AMARYL) 1 MG tablet Take 1 mg by mouth daily with lunch.  07/08/12  Yes [provider]  hydrALAZINE (APRESOLINE) 10 MG tablet TAKE 1 TABLET BY MOUTH THREE TIMES DAILY 12/14/17  Yes Nahser, Deloris Ping, MD  isosorbide mononitrate (IMDUR) 30 MG 24 hr tablet TAKE 1 TABLET BY MOUTH DAILY 06/04/17  Yes Nahser, Deloris Ping, MD  levothyroxine (SYNTHROID, LEVOTHROID) 100 MCG tablet Take 100 mcg by mouth daily before breakfast. 08/23/15  Yes [provider]  Menthol, Topical Analgesic, (BLUE-EMU MAXIMUM STRENGTH EX) Apply 1 application topically 3 (three) times daily as needed (for knee pain.). For sore muscles.    Yes [provider]  metFORMIN (GLUCOPHAGE) 500 MG tablet Take 500 mg by mouth 2 (two) times daily.    Yes [provider]  Multiple Vitamins-Minerals (PRESERVISION AREDS 2) CAPS Take 1 capsule by mouth 2 (two) times daily.   Yes [provider]  Polyethyl Glycol-Propyl Glycol (SYSTANE ULTRA) 0.4-0.3 % SOLN Place 1 drop into both eyes daily.   Yes [provider]  Potassium Chloride CR (MICRO-K) 8 MEQ CPCR capsule CR Take 1 capsule by mouth daily. 08/29/17  Yes [provider]  Vitamin D, Ergocalciferol, (DRISDOL) 50000 units CAPS capsule Take 50,000 Units by mouth every Wednesday.   Yes [provider]  ACCU-CHEK SMARTVIEW test strip Use as directed to test blood glucose each morning. 08/23/15   [provider]    Family History Family History  Problem Relation Age of Onset  . Alzheimer's disease Mother   . Diabetes Mother   . Gallbladder disease Mother   . Cancer - Cervical Mother   . Coronary artery disease Father   . Diabetes Father     Social History Social History   Tobacco Use  . Smoking status: Former Smoker    Packs/day: 1.00    Years: 40.00    Pack years: 40.00    Types: Cigarettes    Last attempt to quit: 04/25/1995    Years since quitting: 22.7  . Smokeless tobacco: Never Used  Substance Use Topics  . Alcohol use: No    Alcohol/week: 0.0 standard drinks  . Drug use: No      Allergies   Ace inhibitors; Atorvastatin; Crestor [rosuvastatin calcium]; Zetia [ezetimibe]; Lopid [gemfibrozil]; and Penicillins   Review of Systems Review of Systems  Constitutional: Negative for fever.  Respiratory: Positive for shortness of breath. Negative for cough.   Cardiovascular: Negative for chest pain, claudication, leg swelling and syncope.  Gastrointestinal: Negative for vomiting.  All other systems reviewed and are negative.    Physical Exam Updated Vital Signs BP 132/78   Pulse 88   Temp 98 F (36.7 C) (Oral)   Resp (!) 24   Ht 5\' 8"  (1.727 m)   Wt 69.4 kg   SpO2 97%   BMI 23.26 kg/m   Physical Exam  Constitutional: He is oriented to person, place, and time. He appears well-developed and well-nourished. No distress.  HENT:  Head: Normocephalic and atraumatic.  Mouth/Throat: Oropharynx is clear and moist.  Eyes:  Pupils are equal, round, and reactive to light. Conjunctivae and EOM are normal.  Neck: Normal range of motion. Neck supple.  Cardiovascular: Normal rate, regular rhythm and normal heart sounds.  Pulmonary/Chest: Effort normal and breath sounds normal. No respiratory distress.  Abdominal: Soft. He exhibits no distension. There is no tenderness.  Musculoskeletal: Normal range of motion. He exhibits no edema or deformity.  1+ lower extremity edema to bilateral anterior legs    Neurological: He is alert and oriented to person, place, and time.  Skin: Skin is warm and dry.  Psychiatric: He has a normal mood and affect.  Nursing note and vitals reviewed.    ED Treatments / Results  Labs (all labs ordered are listed, but only abnormal results are displayed) Labs Reviewed  BASIC METABOLIC PANEL - Abnormal; Notable for the following components:      Result Value   Glucose, Bld 148 (*)    BUN 29 (*)    Creatinine, Ser 1.48 (*)    GFR calc non Af Amer 43 (*)    GFR calc Af Amer 49 (*)    All other components within normal limits  CBC -  Abnormal; Notable for the following components:   WBC 10.6 (*)    RBC 3.87 (*)    Hemoglobin 11.8 (*)    HCT 36.7 (*)    RDW 16.7 (*)    All other components within normal limits  BRAIN NATRIURETIC PEPTIDE - Abnormal; Notable for the following components:   B Natriuretic Peptide 1,223.3 (*)    All other components within normal limits  I-STAT TROPONIN, ED    EKG EKG Interpretation  Date/Time:  Monday January 14 2018 09:55:26 EDT Ventricular Rate:  97 PR Interval:    QRS Duration: 120 QT Interval:  348 QTC Calculation: 441 R Axis:   28 Text Interpretation:  Atrial fibrillation with premature ventricular or aberrantly conducted complexes Possible Anterior infarct , age undetermined ST & T wave abnormality, consider inferolateral ischemia Abnormal ECG Confirmed by Kristine Royal [phone]) on 01/14/2018 11:39:53 AM   Radiology Dg Chest 2 View  Result Date: 01/14/2018 CLINICAL DATA:  Shortness of Breath EXAM: CHEST - 2 VIEW COMPARISON:  Chest radiograph and chest CT Sep 04, 2015 FINDINGS: Persistent interstitial prominence, likely chronic interstitial edema, stable. There may be a degree of underlying fibrosis. There is a small right pleural effusion. There is cardiomegaly with mild pulmonary venous hypertension. No adenopathy. Patient is status post coronary artery bypass grafting. There is aortic atherosclerosis. No evident bone lesions. There is apparent aneurysmal dilatation in the upper abdominal aorta. IMPRESSION: 1. There is apparent upper abdominal aneurysmal dilatation of the aorta which on lateral view appears to measure approximately 5 cm. Advise CT abdomen or ultrasound abdomen to further assess the upper abdominal aorta. 2. Suspect a degree of chronic congestive heart failure. There may be a degree of underlying fibrosis in the lungs. No consolidation. 3. Aortic atherosclerosis. Status post coronary artery bypass grafting. Aortic Atherosclerosis (ICD10-I70.0). Aortic aneurysm NOS  (ICD10-I71.9). Electronically Signed   By: Bretta Bang III M.D.   On: 01/14/2018 10:37    Procedures Procedures (including critical care time)  Medications Ordered in ED Medications - No data to display   Initial Impression / Assessment and Plan / ED Course  I have reviewed the triage vital signs and the nursing notes.  Pertinent labs & imaging results that were available during my care of the patient were reviewed by me and considered in my medical  decision making (see chart for details).     CHA2DS2/VAS Stroke Risk Points  Current as of about an hour ago     6 >= 2 Points: High Risk  1 - 1.99 Points: Medium Risk  0 Points: Low Risk    This is the only CHA2DS2/VAS Stroke Risk Points available for the past  year.:  Last Change: N/A     Details    This score determines the patient's risk of having a stroke if the  patient has atrial fibrillation.       Points Metrics  1 Has Congestive Heart Failure:  Yes    Current as of about an hour ago  1 Has Vascular Disease:  Yes    Current as of about an hour ago  1 Has Hypertension:  Yes    Current as of about an hour ago  2 Age:  [age]    Current as of about an hour ago  1 Has Diabetes:  Yes    Current as of about an hour ago  0 Had Stroke:  No  Had TIA:  No  Had thromboembolism:  No    Current as of about an hour ago  0 Male:  No    Current as of about an hour ago       MDM  Screen complete  Patient is presenting for reported shortness of breath.  Upon my evaluation the patient appears to be comfortable.  Screening labs are without evidence of significant acute pathology.  I suspect that the patient may have some degree of congestive heart failure.    Patient refuses admission for further workup and treatment.  Patient refuses an IV dose of Lasix in the ED.  Patient desires discharge home.  He reports that he will take an extra 1/2 dose of Lasix at home.    He is aware of the need for close follow-up.  Strict return  precautions are given and understood.     Final Clinical Impressions(s) / ED Diagnoses   Final diagnoses:  Congestive heart failure, unspecified HF chronicity, unspecified heart failure type [facility](HCC)    ED Discharge Orders    None       Wynetta FinesMessick, Peter C, MD 01/14/18 1408

## 2018-01-14 NOTE — Discharge Instructions (Addendum)
Please return for any problem.  Follow-up with your regular doctor as instructed. You make take an extra 10 mg of lasix later today (a total of 30mg  today).

## 2018-01-14 NOTE — ED Triage Notes (Signed)
Pt endorses shob x 2 days. Has hx of CABG. Denies CP.

## 2018-01-22 ENCOUNTER — Telehealth: Payer: Self-pay

## 2018-01-22 ENCOUNTER — Encounter: Payer: Self-pay | Admitting: Cardiovascular Disease

## 2018-01-22 ENCOUNTER — Ambulatory Visit: Payer: Medicare Other | Admitting: Cardiovascular Disease

## 2018-01-22 VITALS — BP 100/50 | HR 46 | Ht 68.0 in | Wt 146.0 lb

## 2018-01-22 DIAGNOSIS — I5022 Chronic systolic (congestive) heart failure: Secondary | ICD-10-CM

## 2018-01-22 DIAGNOSIS — E782 Mixed hyperlipidemia: Secondary | ICD-10-CM

## 2018-01-22 DIAGNOSIS — Z951 Presence of aortocoronary bypass graft: Secondary | ICD-10-CM | POA: Diagnosis not present

## 2018-01-22 DIAGNOSIS — Z79899 Other long term (current) drug therapy: Secondary | ICD-10-CM

## 2018-01-22 LAB — BASIC METABOLIC PANEL
BUN/Creatinine Ratio: 29 — ABNORMAL HIGH (ref 10–24)
BUN: 63 mg/dL — AB (ref 8–27)
CALCIUM: 10.3 mg/dL — AB (ref 8.6–10.2)
CO2: 23 mmol/L (ref 20–29)
Chloride: 96 mmol/L (ref 96–106)
Creatinine, Ser: 2.21 mg/dL — ABNORMAL HIGH (ref 0.76–1.27)
GFR calc non Af Amer: 27 mL/min/{1.73_m2} — ABNORMAL LOW (ref >59)
GFR, EST AFRICAN AMERICAN: 31 mL/min/{1.73_m2} — AB (ref >59)
GLUCOSE: 121 mg/dL — AB (ref 65–99)
Potassium: 5.6 mmol/L — ABNORMAL HIGH (ref 3.5–5.2)
Sodium: 139 mmol/L (ref 134–144)

## 2018-01-22 MED ORDER — ENTRESTO 24-26 MG PO TABS: 1.0000 | ORAL_TABLET | Freq: Two times a day (BID) | ORAL | 3 refills | Status: DC

## 2018-01-22 MED ORDER — FUROSEMIDE 40 MG PO TABS: 40.0000 mg | ORAL_TABLET | Freq: Every day | ORAL | 1 refills | Status: DC

## 2018-01-22 MED ORDER — POTASSIUM CHLORIDE ER 20 MEQ PO TBCR: 20.0000 meq | EXTENDED_RELEASE_TABLET | Freq: Every day | ORAL | 1 refills | Status: DC
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
## 2018-01-22 NOTE — Addendum Note (Signed)
Addended by: Marylou Flesher on: 01/22/2018 11:07 AM   Modules accepted: Orders

## 2018-01-22 NOTE — Patient Instructions (Addendum)
Medication Instructions:  Your physician has recommended you make the following change in your medication:  1. DECREASE Lasix to 40 mg ONCE daily 2. DECREASE Potassium to 20 mEq ONCE daily  * If you need a refill on your cardiac medications before your next appointment, please call your pharmacy.   Labwork: - BMET today - Your physician recommends that you return for lab work in: 2-3 weeks for a repeat BMET *We will only notify you of abnormal results, otherwise continue current treatment plan.  Testing/Procedures: None ordered  Follow-Up: Your physician recommends that you schedule a follow-up appointment in: 2-3 months with Dr. [name].   Thank you for choosing [facility]!!

## 2018-01-22 NOTE — Telephone Encounter (Signed)
While at an OV with Dr [name] this morning the pt and his daughter gave me both a completed Entresto [employer] Pt Asst application for [employer] and a [employer] Pt asst application for Eliquis.  I completed the provider part on both applications, Dr [name] signed them and I have faxed them to the correct Pt Asst programs. I did receive confirmation that both were successfully faxed.

## 2018-01-24 NOTE — Telephone Encounter (Signed)
Received a message from [facility] that they received a completed application for medication assistance.

## 2018-01-29 ENCOUNTER — Other Ambulatory Visit: Payer: Medicare Other

## 2018-01-30 NOTE — Telephone Encounter (Signed)
**Note De-Identified Lashone Stauber Obfuscation** Letter received Chancey Cullinane fax from [employer] stating that they have approved the pt for pt asst with Eliquis. Approval good from 01/24/2018 until 04/23/18.  Application Case# [id number]

## 2018-01-30 NOTE — Telephone Encounter (Signed)
**Note De-Identified Elijah Saunders Obfuscation** Letter received Elijah Saunders fax from [facility] stating that the pt has secured coverage for Entresto through their insurance provider.  The letter states that they have notified the pt by letter and that they have referred him to the [employer] foundation for financial assistance for Elijah Saunders.  I called the pt to see if he received his letter as well and if he has applied for [name]. Per the pt and his wife their daughter applied to [name] on the pts behalf through the internet and that he has been approved for $1000.

## 2018-02-05 ENCOUNTER — Other Ambulatory Visit: Payer: Medicare Other

## 2018-02-05 DIAGNOSIS — I5022 Chronic systolic (congestive) heart failure: Secondary | ICD-10-CM

## 2018-02-05 DIAGNOSIS — Z79899 Other long term (current) drug therapy: Secondary | ICD-10-CM

## 2018-02-05 DIAGNOSIS — Z951 Presence of aortocoronary bypass graft: Secondary | ICD-10-CM

## 2018-02-05 DIAGNOSIS — E782 Mixed hyperlipidemia: Secondary | ICD-10-CM

## 2018-02-05 LAB — BASIC METABOLIC PANEL
BUN/Creatinine Ratio: 34 — ABNORMAL HIGH (ref 10–24)
BUN: 61 mg/dL — ABNORMAL HIGH (ref 8–27)
CO2: 21 mmol/L (ref 20–29)
Calcium: 10.4 mg/dL — ABNORMAL HIGH (ref 8.6–10.2)
Chloride: 98 mmol/L (ref 96–106)
Creatinine, Ser: 1.79 mg/dL — ABNORMAL HIGH (ref 0.76–1.27)
GFR, EST AFRICAN AMERICAN: 40 mL/min/{1.73_m2} — AB (ref >59)
GFR, EST NON AFRICAN AMERICAN: 35 mL/min/{1.73_m2} — AB (ref >59)
Glucose: 100 mg/dL — ABNORMAL HIGH (ref 65–99)
POTASSIUM: 5.3 mmol/L — AB (ref 3.5–5.2)
SODIUM: 137 mmol/L (ref 134–144)

## 2018-02-05 LAB — LIPID PANEL
CHOLESTEROL TOTAL: 171 mg/dL (ref 100–199)
Chol/HDL Ratio: 4.8 ratio (ref 0.0–5.0)
HDL: 36 mg/dL — ABNORMAL LOW (ref >39)
LDL Calculated: 101 mg/dL — ABNORMAL HIGH (ref 0–99)
Triglycerides: 168 mg/dL — ABNORMAL HIGH (ref 0–149)
VLDL CHOLESTEROL CAL: 34 mg/dL (ref 5–40)

## 2018-02-05 LAB — HEPATIC FUNCTION PANEL
ALBUMIN: 4.3 g/dL (ref 3.5–4.7)
ALK PHOS: 100 IU/L (ref 39–117)
ALT: 23 IU/L (ref 0–44)
AST: 23 IU/L (ref 0–40)
BILIRUBIN TOTAL: 0.5 mg/dL (ref 0.0–1.2)
Bilirubin, Direct: 0.21 mg/dL (ref 0.00–0.40)
Total Protein: 7.2 g/dL (ref 6.0–8.5)

## 2018-02-07 ENCOUNTER — Telehealth: Payer: Self-pay

## 2018-02-07 DIAGNOSIS — I10 Essential (primary) hypertension: Secondary | ICD-10-CM

## 2018-02-07 DIAGNOSIS — Z79899 Other long term (current) drug therapy: Secondary | ICD-10-CM

## 2018-02-07 DIAGNOSIS — I5022 Chronic systolic (congestive) heart failure: Secondary | ICD-10-CM

## 2018-02-07 DIAGNOSIS — E875 Hyperkalemia: Secondary | ICD-10-CM

## 2018-02-07 NOTE — Telephone Encounter (Signed)
Spoke with the pts daughter Belenda Cruise [phone]) and she was advised to have the pt hold his Kdur and will repeat his Bmet in one week.Marland Kitchen She was also reporting that his BP was still running low... 90's/40's and HR in the 50's.They do not have all of the readings recorded but says it is about the same every day and has checked the battery ion his cuff. He stopped his Hydralazine one week ago per the pharmacist, and as of yesterday decreased his Sherryll Burger to once a day, he is still taking his lasix and his Coreg... She says that he is having dizziness and feels like passing out when rising... She says that he has been drinking fluids but reluctant since he has had fluid problems on his last labs.. So she is asking what fluid amount should he be drinking per day? Advised her that I will forward message to Dr. [name] and will let her know his recommendations going forward re: his fluid intake and his meds.... She verbalized understanding and agrees.

## 2018-02-07 NOTE — Telephone Encounter (Signed)
Per Dr. [name].. Pt daughter Silva Bandy advised of appt made with Tereso Newcomer PA  and repeat BMET on 02/15/18 and she verbalized understanding to hold the pts Kdur and Lasix.. Will continue to monitor him and let us know if he has any worsening symptoms prior to the appt.

## 2018-02-07 NOTE — Telephone Encounter (Signed)
He should hold the Lasix and Kdur at this point  ( because of his orthostasis)  This will help the dizziness. He will need an appt with an APP in the next several weeks or so

## 2018-02-11 NOTE — Telephone Encounter (Signed)
Called patient to assess how he is feeling today. He stopped Kdur, Hydralazine and Lasix as advised on 10/17. I reviewed his medication list with him. He reports BP and pulse readings today are: 121/93, 86;  94/58, 86; and 84/51, 66. Yesterdays readings are: 100/61, 86; 106/61, 86; 102/53, 75. He reports intermittent dizziness. Patient reports he has not taken Entresto since Saturday morning. I advised that these BP readings are not better since holding the other medications and that he should continue to hold Entresto until he sees [employer] [place] on Friday 10/25. He reports current weight is 143 lb. Patient has lost almost 20 lb since December. I reviewed diet with him and he states he does not eat as much as he used to. I  encouraged him to eat regular meals with lean protein and to drink 64 oz water daily. He states he rarely drinks water. He reports low blood sugar readings as well. I advised him to call Dr. Fredirick Maudlin office for advice regarding blood sugar and to call back to our office prior to appointment Friday with questions or concerns. He thanked me for the call.

## 2018-02-12 ENCOUNTER — Encounter: Payer: Self-pay | Admitting: Cardiovascular Disease

## 2018-02-15 ENCOUNTER — Ambulatory Visit: Payer: Medicare Other | Admitting: Physician Assistant

## 2018-02-15 ENCOUNTER — Encounter: Payer: Self-pay | Admitting: Physician Assistant

## 2018-02-15 VITALS — BP 120/60 | HR 88 | Ht 68.0 in | Wt 149.8 lb

## 2018-02-15 DIAGNOSIS — I5022 Chronic systolic (congestive) heart failure: Secondary | ICD-10-CM

## 2018-02-15 DIAGNOSIS — I2510 Atherosclerotic heart disease of native coronary artery without angina pectoris: Secondary | ICD-10-CM | POA: Diagnosis not present

## 2018-02-15 DIAGNOSIS — I4819 Other persistent atrial fibrillation: Secondary | ICD-10-CM

## 2018-02-15 DIAGNOSIS — I10 Essential (primary) hypertension: Secondary | ICD-10-CM | POA: Diagnosis not present

## 2018-02-15 DIAGNOSIS — N183 Chronic kidney disease, stage 3 unspecified: Secondary | ICD-10-CM

## 2018-02-15 NOTE — Progress Notes (Signed)
Cardiology Office Note:    Date:  02/15/2018   ID:  Elijah Saunders, DOB [birth year], MRN [id number]  PCP:  Aida Puffer, MD  Cardiologist:  [name], MD  Electrophysiologist:  Lewayne Bunting, MD   Referring MD: Aida Puffer, MD   Chief Complaint  Patient presents with  . Follow-up    CHF    History of Present Illness:    Elijah Saunders is a 84 y.o. male with coronary artery disease status post prior anterior and inferior myocardial infarction, status post CABG in 2003, ischemic cardiomyopathy, chronic systolic heart failure, abdominal aortic aneurysm s/p prior repair, persistent atrial fibrillation, chronic kidney disease, status post left nephrectomy, hypertension, hyperlipidemia.  Cardiac catheterization in August 2018 demonstrated severe native CAD with an occluded RCA, LCx and LAD.  LIMA-LAD, SVG-RCA were patent.  The vein grafts to the diagonal and obtuse marginal were both occluded.  He was evaluated by Dr. Ladona Ridgel in September 2018 for consideration of prophylactic ICD implantation.  Given his advanced age, it was decided to hold off on ICD implantation unless he develops syncope or ventricular tachycardia.    He was seen in the emergency room and September 2019 with volume overload.  He was treated with IV Lasix and his daily furosemide was increased.  He was last seen by Dr. [name] 01/22/2018.  His Lasix dose was reduced and he was placed on Entresto.  However, the patient developed symptomatic low blood pressure.  He contacted our office.  He was noted to have worsening renal function and elevated potassium.  His Entresto, potassium, hydralazine and Lasix were all placed on hold.  Elijah Saunders returns for follow-up.  Since reducing his medications, he feels much better.  His blood pressure is back to normal.  His weights have been fairly stable.  He has not had any chest discomfort, paroxysmal nocturnal dyspnea or lower extremity swelling.  He has not had a significant shortness of  breath.  He denies syncope.  Prior CV studies:   The following studies were reviewed today:  Carotid US 02/14/2017 Bilateral ICA 1-39 Repeat 12 months  Right and left heart catheterization 12/19/2016 LAD proximal 90, mid 100; D1 ostial 75 LCx proximal 85, mid 100 RCA proximal 100, mid 100 distal 100; RPAVB 100 SVG-OM 2 100 SVG-D1 100 LIMA-distal LAD patent SVG-distal RCA patent EF 25-35  Left ventricular systolic dysfunction with regional wall motion abnormality involving the inferior wall and apex greater than the anterior wall. Estimated ejection fraction 25-30%. LVEDP 18 millimeters mercury and consistent with chronic systolic heart failure.  Severe native vessel coronary disease with total occlusion of the RCA, mid circumflex, and mid LAD. Ostial 50% left main narrowing.  Bypass graft failure with occlusion of the graft to the diagonal and to the obtuse marginal.  Patent LIMA to the mid to distal LAD.   Patent saphenous vein graft to the distal RCA.  Normal right heart pressures including pulmonary capillary wedge of 10 mmHg  Event monitor 11/30/2016  Sinus rhythm  No significant arrhytmias   Echo 11/30/2016 Moderate concentric LVH, EF 25-30, anterior and apical septal akinesis, lateral hypokinesis, grade 2 diastolic dysfunction, severe AV annular calcification, mild aortic stenosis (mean 11), aortic root 40 mm, mild MR, severe LAE, atrial septal lipomatous hypertrophy   Past Medical History:  Diagnosis Date  . AAA (abdominal aortic aneurysm) [facility]) 2003   s/p AAA R&G June 2003  . CAD (coronary artery disease) 2003   s/p CABG, 2003   . CHF (  congestive heart failure) (HCC)    EF 35-40% by echo, May 2017  . Chronic renal insufficiency, stage III (moderate) (HCC)   . HTN (hypertension)   . Hypercholesteremia    statin intol  . MI (myocardial infarction) (HCC)   . SBO (small bowel obstruction) [facility]) Nov 2002   Surgical Hx: The patient  has a past surgical history  that includes Nephrectomy (Left); Hernia repair; Coronary artery bypass graft (March 2003); Abdominal aortic aneurysm repair (June 2003); TEE without cardioversion (N/A, 09/08/2015); Cardioversion (N/A, 09/08/2015); and RIGHT/LEFT HEART CATH AND CORONARY/GRAFT ANGIOGRAPHY (N/A, 12/19/2016).   Current Medications: Current Meds  Medication Sig  . ACCU-CHEK SMARTVIEW test strip Use as directed to test blood glucose each morning.  Marland Kitchen allopurinol (ZYLOPRIM) 300 MG tablet Take 300 mg by mouth daily.  Marland Kitchen apixaban (ELIQUIS) 2.5 MG TABS tablet Take 1 tablet (2.5 mg total) by mouth 2 (two) times daily.  . carvedilol (COREG) 6.25 MG tablet TAKE 1 TABLET BY MOUTH TWICE DAILY  . cyanocobalamin 500 MCG tablet Take 500 mcg by mouth daily.   Marland Kitchen glimepiride (AMARYL) 1 MG tablet Take 1 mg by mouth daily with lunch.   . isosorbide mononitrate (IMDUR) 30 MG 24 hr tablet TAKE 1 TABLET BY MOUTH DAILY  . levothyroxine (SYNTHROID, LEVOTHROID) 100 MCG tablet Take 100 mcg by mouth daily before breakfast.  . Menthol, Topical Analgesic, (BLUE-EMU MAXIMUM STRENGTH EX) Apply 1 application topically 3 (three) times daily as needed (for knee pain.). For sore muscles.   . metFORMIN (GLUCOPHAGE) 500 MG tablet Take 500 mg by mouth 2 (two) times daily.   . Multiple Vitamins-Minerals (PRESERVISION AREDS 2) CAPS Take 1 capsule by mouth 2 (two) times daily.  Bertram Gala Glycol-Propyl Glycol (SYSTANE ULTRA) 0.4-0.3 % SOLN Place 1 drop into both eyes daily.  . Vitamin D, Ergocalciferol, (DRISDOL) 50000 units CAPS capsule Take 50,000 Units by mouth every Wednesday.     Allergies:   Ace inhibitors; Atorvastatin; Crestor [rosuvastatin calcium]; Lopid [gemfibrozil]; Penicillins; and Zetia [ezetimibe]   Social History   Tobacco Use  . Smoking status: Former Smoker    Packs/day: 1.00    Years: 40.00    Pack years: 40.00    Types: Cigarettes    Last attempt to quit: 04/25/1995    Years since quitting: 22.8  . Smokeless tobacco: Never Used    Substance Use Topics  . Alcohol use: No    Alcohol/week: 0.0 standard drinks  . Drug use: No     Family Hx: The patient's family history includes Alzheimer's disease in his mother; Cancer - Cervical in his mother; Coronary artery disease in his father; Diabetes in his father and mother; Gallbladder disease in his mother.  ROS:   Please see the history of present illness.    ROS All other systems reviewed and are negative.   EKGs/Labs/Other Test Reviewed:    EKG:  EKG is   ordered today.  The ekg ordered today demonstrates atrial fibrillation, heart rate 81, PVCs versus aberrant conduction, lateral T wave inversions, similar to prior tracing  Recent Labs: 01/14/2018: B Natriuretic Peptide 1,223.3; Hemoglobin 11.8; Platelets 270 02/05/2018: ALT 23; BUN 61; Creatinine, Ser 1.79; Potassium 5.3; Sodium 137   Recent Lipid Panel Lab Results  Component Value Date/Time   CHOL 171 02/05/2018 07:56 AM   TRIG 168 (H) 02/05/2018 07:56 AM   HDL 36 (L) 02/05/2018 07:56 AM   CHOLHDL 4.8 02/05/2018 07:56 AM   CHOLHDL 6.6 09/06/2015 03:40 AM   LDLCALC 101 (  H) 02/05/2018 07:56 AM   LDLDIRECT 125.2 11/06/2011 08:42 AM    Physical Exam:    VS:  BP 120/60   Pulse 88   Ht 5\' 8"  (1.727 m)   Wt 149 lb 12.8 oz (67.9 kg)   SpO2 93%   BMI 22.78 kg/m     Wt Readings from Last 3 Encounters:  02/15/18 149 lb 12.8 oz (67.9 kg)  01/22/18 146 lb (66.2 kg)  01/14/18 153 lb (69.4 kg)     Physical Exam  Constitutional: He is oriented to person, place, and time. He appears well-developed and well-nourished. No distress.  HENT:  Head: Normocephalic and atraumatic.  Eyes: No scleral icterus.  Neck: No JVD present. No thyromegaly present.  Cardiovascular: Normal rate. An irregularly irregular rhythm present.  Murmur heard.  Holosystolic murmur is present with a grade of 2/6 at the lower left sternal border. Pulmonary/Chest: Effort normal. He has no rales.  Abdominal: Soft.  Musculoskeletal: He  exhibits no edema.  Lymphadenopathy:    He has no cervical adenopathy.  Neurological: He is alert and oriented to person, place, and time.  Skin: Skin is warm and dry.  Psychiatric: He has a normal mood and affect.    ASSESSMENT & PLAN:    Chronic systolic CHF (congestive heart failure) (HCC)  EF 25-30.  He is NYHA 2.  Volume status currently appears stable.  Since stopping his diuretic, hydralazine and Entresto, his weights have been fairly stable.  For now, continue isosorbide, carvedilol.  We will eventually try to resume hydralazine.  I suspect, we will have to avoid Entresto due to his labile blood pressure.  Follow-up BMET will be obtained today.  If his renal function is stable, I would recommend placing him back on Lasix 40 mg daily.  Keep follow-up with Dr. [name] in 3 weeks.  Essential hypertension As noted, blood pressure is improved.  Coronary artery disease involving native coronary artery of native heart without angina pectoris History of remote CABG in 2003.  Cardiac catheterization in August 2018 with 2/4 bypass grafts patent.  He denies anginal symptoms.  He is not on aspirin as he is on Apixaban.  He is intolerant to statins.  Persistent atrial fibrillation He remains in atrial fibrillation.  Heart rate is controlled.  Continue Apixaban.  CKD (chronic kidney disease) stage 3, GFR 30-59 ml/min (HCC)  His creatinine recently increased.  Repeat BMET will be obtained today.   Dispo:  Return in about 17 days (around 03/04/2018) for Scheduled Follow Up, w/ Dr. [name].   Medication Adjustments/Labs and Tests Ordered: Current medicines are reviewed at length with the patient today.  Concerns regarding medicines are outlined above.  Tests Ordered: Orders Placed This Encounter  Procedures  . Basic Metabolic Panel (BMET)  . EKG 12-Lead   Medication Changes: No orders of the defined types were placed in this encounter.   Signed, Tereso Newcomer, PA-C  02/15/2018 1:15 PM     [facility] [address] [place], [place], [place]  [address] Phone: [phone]; Fax: [phone]

## 2018-02-15 NOTE — Patient Instructions (Signed)
Medication Instructions:  1. Your physician recommends that you continue on your current medications as directed. Please refer to the Current Medication list given to you today.  If you need a refill on your cardiac medications before your next appointment, please call your pharmacy.   Lab work: TODAY BMET If you have labs (blood work) drawn today and your tests are completely normal, you will receive your results only by: Marland Kitchen MyChart Message (if you have MyChart) OR . A paper copy in the mail If you have any lab test that is abnormal or we need to change your treatment, we will call you to review the results.  Testing/Procedures: NONE ORDERED TODAY  Follow-Up: At [facility], you and your health needs are our priority.  As part of our continuing mission to provide you with exceptional heart care, we have created designated Provider Care Teams.  These Care Teams include your primary Cardiologist (physician) and Advanced Practice Providers (APPs -  Physician Assistants and Nurse Practitioners) who all work together to provide you with the care you need, when you need it. Marland Kitchen KEEP YOUR APPT WITH DR. [name] 03/04/18  Any Other Special Instructions Will Be Listed Below (If Applicable).

## 2018-02-16 LAB — BASIC METABOLIC PANEL
BUN / CREAT RATIO: 25 — AB (ref 10–24)
BUN: 32 mg/dL — AB (ref 8–27)
CHLORIDE: 101 mmol/L (ref 96–106)
CO2: 20 mmol/L (ref 20–29)
Calcium: 10.3 mg/dL — ABNORMAL HIGH (ref 8.6–10.2)
Creatinine, Ser: 1.29 mg/dL — ABNORMAL HIGH (ref 0.76–1.27)
GFR calc non Af Amer: 52 mL/min/{1.73_m2} — ABNORMAL LOW (ref >59)
GFR, EST AFRICAN AMERICAN: 60 mL/min/{1.73_m2} (ref >59)
GLUCOSE: 96 mg/dL (ref 65–99)
POTASSIUM: 4.7 mmol/L (ref 3.5–5.2)
SODIUM: 139 mmol/L (ref 134–144)

## 2018-02-18 ENCOUNTER — Telehealth: Payer: Self-pay | Admitting: *Deleted

## 2018-02-18 DIAGNOSIS — I5022 Chronic systolic (congestive) heart failure: Secondary | ICD-10-CM

## 2018-02-18 DIAGNOSIS — N183 Chronic kidney disease, stage 3 unspecified: Secondary | ICD-10-CM

## 2018-02-18 MED ORDER — FUROSEMIDE 20 MG PO TABS: 20.0000 mg | ORAL_TABLET | Freq: Every day | ORAL | 3 refills | Status: DC

## 2018-02-18 NOTE — Telephone Encounter (Signed)
Pt called back and wanted to make sure that he wrote down the lasix dose correct of 20 mg daily. Pt states he was on 40 mg before. I explained that we were just going to have him take 20 mg once a day. Pt asked for a new Rx has he only had a few 20 mg tabs left. I will send in Rx to [facility] per pt request. Pt thanked me for my help.

## 2018-02-18 NOTE — Telephone Encounter (Signed)
Pt has been notified lab results by phone with verbal understanding. Pt advised to resume lasix 20 mg daily with bmet to be done in 1 week, pt is agreeable to plan of care. Pt has appt with Dr. [name] 03/04/18 @ 9:20 and asked if he could get lab work when he see's Dr. [name]. I stated I did not think that would be a problem however I will d/w Lorin Picket W. PA. I advised pt if PA wants lab work in 1 week I will call back, if not then we will get bmet 11/11 when he see's Dr. [name]. Pt thanked me for the call.

## 2018-02-18 NOTE — Telephone Encounter (Signed)
I would like to get the BMET next week if that is not too much trouble for him.  I want to make sure his renal function does not worsen again.  If it is too much for him to come in for lab work next week, he can get it at the appointment with Dr. [name]. Tereso Newcomer, PA-C    02/18/2018 1:34 PM

## 2018-02-18 NOTE — Telephone Encounter (Signed)
I s/w pt and advised per Bing Neighbors. PA he would like for lab work to be done in 1 week, BMET scheduled for 02/27/18.

## 2018-02-18 NOTE — Telephone Encounter (Signed)
-----   Message from Beatrice Lecher, [place] sent at 02/17/2018  7:54 PM EDT ----- Renal function improved.  He should be able to resume daily Furosemide. Recommendations:  - Start Lasix 20 mg QD  - Check BMET 1 week Tereso Newcomer, PA-C    02/17/2018 7:51 PM

## 2018-02-21 ENCOUNTER — Ambulatory Visit: Payer: Medicare Other | Admitting: Physician Assistant

## 2018-02-27 ENCOUNTER — Other Ambulatory Visit: Payer: Medicare Other | Admitting: *Deleted

## 2018-02-27 DIAGNOSIS — N183 Chronic kidney disease, stage 3 unspecified: Secondary | ICD-10-CM

## 2018-02-27 DIAGNOSIS — I5022 Chronic systolic (congestive) heart failure: Secondary | ICD-10-CM

## 2018-02-27 LAB — BASIC METABOLIC PANEL
BUN/Creatinine Ratio: 20 (ref 10–24)
BUN: 27 mg/dL (ref 8–27)
CALCIUM: 9.7 mg/dL (ref 8.6–10.2)
CO2: 24 mmol/L (ref 20–29)
CREATININE: 1.35 mg/dL — AB (ref 0.76–1.27)
Chloride: 99 mmol/L (ref 96–106)
GFR calc Af Amer: 56 mL/min/{1.73_m2} — ABNORMAL LOW (ref >59)
GFR calc non Af Amer: 49 mL/min/{1.73_m2} — ABNORMAL LOW (ref >59)
GLUCOSE: 107 mg/dL — AB (ref 65–99)
Potassium: 4 mmol/L (ref 3.5–5.2)
Sodium: 140 mmol/L (ref 134–144)

## 2018-03-01 ENCOUNTER — Telehealth: Payer: Self-pay

## 2018-03-01 DIAGNOSIS — I5022 Chronic systolic (congestive) heart failure: Secondary | ICD-10-CM

## 2018-03-01 NOTE — Telephone Encounter (Signed)
Reviewed results with pt who verbalized understanding. Per Scott's recommendations to obtain a BMET 1 month, orders and appointment (11/4 @ 9:45 a)  have been placed in Epic.

## 2018-03-04 ENCOUNTER — Ambulatory Visit: Payer: Medicare Other | Admitting: Cardiovascular Disease

## 2018-03-04 ENCOUNTER — Encounter: Payer: Self-pay | Admitting: Cardiovascular Disease

## 2018-03-04 VITALS — BP 120/70 | HR 110 | Ht 68.0 in | Wt 154.6 lb

## 2018-03-04 DIAGNOSIS — I4819 Other persistent atrial fibrillation: Secondary | ICD-10-CM | POA: Diagnosis not present

## 2018-03-04 DIAGNOSIS — I5022 Chronic systolic (congestive) heart failure: Secondary | ICD-10-CM

## 2018-03-04 MED ORDER — METOPROLOL TARTRATE 25 MG PO TABS: 25.0000 mg | ORAL_TABLET | Freq: Two times a day (BID) | ORAL | 11 refills | Status: AC

## 2018-03-04 NOTE — Patient Instructions (Signed)
Medication Instructions:  Your physician has recommended you make the following change in your medication:   STOP Carvedilol (Coreg) START Metoprolol Tartrate (Lopressor) 25 mg twice daily   If you need a refill on your cardiac medications before your next appointment, please call your pharmacy.   Lab work: Product manager appointment in December for BMET  If you have labs (blood work) drawn today and your tests are completely normal, you will receive your results only by: Marland Kitchen MyChart Message (if you have MyChart) OR . A paper copy in the mail If you have any lab test that is abnormal or we need to change your treatment, we will call you to review the results.   Testing/Procedures: None Ordered   Follow-Up: At [facility], you and your health needs are our priority.  As part of our continuing mission to provide you with exceptional heart care, we have created designated Provider Care Teams.  These Care Teams include your primary Cardiologist (physician) and Advanced Practice Providers (APPs -  Physician Assistants and Nurse Practitioners) who all work together to provide you with the care you need, when you need it. You will need a follow up appointment in:  3 months.  Please call our office 2 months in advance to schedule this appointment.  You may see [name], MD or one of the following Advanced Practice Providers on your designated Care Team: Tereso Newcomer, PA-C Vin Vincent, [place] . Berton Bon, NP
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
## 2018-03-13 ENCOUNTER — Ambulatory Visit: Payer: Medicare Other | Admitting: Cardiovascular Disease

## 2018-03-13 ENCOUNTER — Encounter: Payer: Self-pay | Admitting: Cardiovascular Disease

## 2018-03-13 VITALS — BP 114/62 | HR 96 | Ht 68.0 in | Wt 151.0 lb

## 2018-03-13 DIAGNOSIS — I5022 Chronic systolic (congestive) heart failure: Secondary | ICD-10-CM | POA: Diagnosis not present

## 2018-03-13 DIAGNOSIS — I4819 Other persistent atrial fibrillation: Secondary | ICD-10-CM

## 2018-03-13 MED ORDER — AMIODARONE HCL 200 MG PO TABS: 200.0000 mg | ORAL_TABLET | Freq: Two times a day (BID) | ORAL | 11 refills | Status: DC

## 2018-03-13 MED ORDER — FUROSEMIDE 40 MG PO TABS: 40.0000 mg | ORAL_TABLET | Freq: Two times a day (BID) | ORAL | 3 refills | Status: DC

## 2018-03-13 NOTE — Patient Instructions (Signed)
Medication Instructions:  Your physician has recommended you make the following change in your medication:   DECREASE Lasix (Furosemide) to 40 mg twice daily START Amiodarone (Pacerone) 200 mg twice daily  If you need a refill on your cardiac medications before your next appointment, please call your pharmacy.   Lab work: TODAY - CBC, TSH, BMET, Liver  If you have labs (blood work) drawn today and your tests are completely normal, you will receive your results only by: Marland Kitchen. MyChart Message (if you have MyChart) OR . A paper copy in the mail If you have any lab test that is abnormal or we need to change your treatment, we will call you to review the results.  Testing/Procedures: You are scheduled for a Cardioversion on Wednesday Nov. 27 with Dr. Elease HashimotoNahser.  [facility] at [facility]: [address] [place], [place] [address] at 12 pm. (1 hour prior to procedure)  DIET: Nothing to eat or drink after midnight except a sip of water with medications (see medication instructions below)  Medication Instructions: Hold Lasix on the morning of the procedure  Continue your anticoagulant: Eliquis You will need to continue your anticoagulant after your procedure until  you are told by your  Provider that it is safe to stop   Your physician has requested that you have an echocardiogram. Echocardiography is a painless test that uses sound waves to create images of your heart. It provides your doctor with information about the size and shape of your heart and how well your heart's chambers and valves are working. This procedure takes approximately one hour. There are no restrictions for this procedure.    You must have a responsible person to drive you home and stay in the waiting area during your procedure. Failure to do so could result in cancellation.  Bring your insurance cards.  *Special Note: Every effort is made to have your procedure done on  time. Occasionally there are emergencies that occur at the hospital that may cause delays. Please be patient if a delay does occur.    Follow-Up: At [facility], you and your health needs are our priority.  As part of our continuing mission to provide you with exceptional heart care, we have created designated Provider Care Teams.  These Care Teams include your primary Cardiologist (physician) and Advanced Practice Providers (APPs -  Physician Assistants and Nurse Practitioners) who all work together to provide you with the care you need, when you need it. You will need a follow up appointment in:  4 weeks.  Please call our office 2 months in advance to schedule this appointment.  You may see Kristeen MissPhilip Nahser, MD or one of the following Advanced Practice Providers on your designated Care Team: Tereso NewcomerScott Weaver, [place]-C
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
## 2018-03-14 LAB — CBC
Hematocrit: 34.7 % — ABNORMAL LOW (ref 37.5–51.0)
Hemoglobin: 11.1 g/dL — ABNORMAL LOW (ref 13.0–17.7)
MCH: 29.6 pg (ref 26.6–33.0)
MCHC: 32 g/dL (ref 31.5–35.7)
MCV: 93 fL (ref 79–97)
NRBC: 3 % — AB (ref 0–0)
PLATELETS: 280 10*3/uL (ref 150–450)
RBC: 3.75 x10E6/uL — ABNORMAL LOW (ref 4.14–5.80)
RDW: 18.7 % — AB (ref 12.3–15.4)
WBC: 11.3 10*3/uL — ABNORMAL HIGH (ref 3.4–10.8)

## 2018-03-14 LAB — BASIC METABOLIC PANEL
BUN / CREAT RATIO: 26 — AB (ref 10–24)
BUN: 43 mg/dL — ABNORMAL HIGH (ref 8–27)
CALCIUM: 9.5 mg/dL (ref 8.6–10.2)
CHLORIDE: 93 mmol/L — AB (ref 96–106)
CO2: 24 mmol/L (ref 20–29)
Creatinine, Ser: 1.63 mg/dL — ABNORMAL HIGH (ref 0.76–1.27)
GFR calc non Af Amer: 39 mL/min/{1.73_m2} — ABNORMAL LOW (ref >59)
GFR, EST AFRICAN AMERICAN: 45 mL/min/{1.73_m2} — AB (ref >59)
Glucose: 96 mg/dL (ref 65–99)
POTASSIUM: 3.9 mmol/L (ref 3.5–5.2)
SODIUM: 137 mmol/L (ref 134–144)

## 2018-03-14 LAB — HEPATIC FUNCTION PANEL
ALT: 266 IU/L — AB (ref 0–44)
AST: 114 IU/L — ABNORMAL HIGH (ref 0–40)
Albumin: 4.1 g/dL (ref 3.5–4.7)
Alkaline Phosphatase: 107 IU/L (ref 39–117)
Bilirubin Total: 0.9 mg/dL (ref 0.0–1.2)
Bilirubin, Direct: 0.53 mg/dL — ABNORMAL HIGH (ref 0.00–0.40)
Total Protein: 7 g/dL (ref 6.0–8.5)

## 2018-03-14 LAB — TSH: TSH: 1.96 u[IU]/mL (ref 0.450–4.500)

## 2018-03-19 ENCOUNTER — Other Ambulatory Visit: Payer: Self-pay | Admitting: Cardiovascular Disease

## 2018-03-20 ENCOUNTER — Ambulatory Visit (HOSPITAL_COMMUNITY): Payer: Medicare Other | Admitting: Anesthesiology

## 2018-03-20 ENCOUNTER — Encounter (HOSPITAL_COMMUNITY)
Admission: RE | Disposition: A | Discharge status: Home Health | Payer: Self-pay | Source: Ambulatory Visit | Attending: Cardiovascular Disease

## 2018-03-20 ENCOUNTER — Encounter (HOSPITAL_COMMUNITY): Payer: Self-pay | Admitting: *Deleted

## 2018-03-20 ENCOUNTER — Other Ambulatory Visit: Payer: Self-pay

## 2018-03-20 ENCOUNTER — Ambulatory Visit (HOSPITAL_COMMUNITY)
Admission: RE | Admit: 2018-03-20 | Discharge: 2018-03-20 | Disposition: A | Discharge status: Home Health | Payer: Medicare Other | Source: Ambulatory Visit | Attending: Cardiovascular Disease | Admitting: Cardiovascular Disease

## 2018-03-20 DIAGNOSIS — Z888 Allergy status to other drugs, medicaments and biological substances status: Secondary | ICD-10-CM | POA: Diagnosis not present

## 2018-03-20 DIAGNOSIS — I4819 Other persistent atrial fibrillation: Secondary | ICD-10-CM | POA: Diagnosis not present

## 2018-03-20 DIAGNOSIS — I5022 Chronic systolic (congestive) heart failure: Secondary | ICD-10-CM | POA: Diagnosis not present

## 2018-03-20 DIAGNOSIS — Z79899 Other long term (current) drug therapy: Secondary | ICD-10-CM | POA: Insufficient documentation

## 2018-03-20 DIAGNOSIS — I130 Hypertensive heart and chronic kidney disease with heart failure and stage 1 through stage 4 chronic kidney disease, or unspecified chronic kidney disease: Secondary | ICD-10-CM | POA: Insufficient documentation

## 2018-03-20 DIAGNOSIS — I714 Abdominal aortic aneurysm, without rupture: Secondary | ICD-10-CM | POA: Insufficient documentation

## 2018-03-20 DIAGNOSIS — M109 Gout, unspecified: Secondary | ICD-10-CM | POA: Insufficient documentation

## 2018-03-20 DIAGNOSIS — Z823 Family history of stroke: Secondary | ICD-10-CM | POA: Insufficient documentation

## 2018-03-20 DIAGNOSIS — N183 Chronic kidney disease, stage 3 (moderate): Secondary | ICD-10-CM | POA: Diagnosis not present

## 2018-03-20 DIAGNOSIS — Z955 Presence of coronary angioplasty implant and graft: Secondary | ICD-10-CM | POA: Diagnosis not present

## 2018-03-20 DIAGNOSIS — E785 Hyperlipidemia, unspecified: Secondary | ICD-10-CM | POA: Diagnosis not present

## 2018-03-20 DIAGNOSIS — Z7901 Long term (current) use of anticoagulants: Secondary | ICD-10-CM | POA: Insufficient documentation

## 2018-03-20 DIAGNOSIS — Z8249 Family history of ischemic heart disease and other diseases of the circulatory system: Secondary | ICD-10-CM | POA: Insufficient documentation

## 2018-03-20 DIAGNOSIS — E1122 Type 2 diabetes mellitus with diabetic chronic kidney disease: Secondary | ICD-10-CM | POA: Diagnosis not present

## 2018-03-20 DIAGNOSIS — Z905 Acquired absence of kidney: Secondary | ICD-10-CM | POA: Insufficient documentation

## 2018-03-20 DIAGNOSIS — I252 Old myocardial infarction: Secondary | ICD-10-CM | POA: Insufficient documentation

## 2018-03-20 DIAGNOSIS — Z9889 Other specified postprocedural states: Secondary | ICD-10-CM | POA: Insufficient documentation

## 2018-03-20 DIAGNOSIS — Z880 Allergy status to penicillin: Secondary | ICD-10-CM | POA: Insufficient documentation

## 2018-03-20 DIAGNOSIS — Z87891 Personal history of nicotine dependence: Secondary | ICD-10-CM | POA: Diagnosis not present

## 2018-03-20 DIAGNOSIS — Z951 Presence of aortocoronary bypass graft: Secondary | ICD-10-CM | POA: Insufficient documentation

## 2018-03-20 DIAGNOSIS — Z79890 Hormone replacement therapy (postmenopausal): Secondary | ICD-10-CM | POA: Diagnosis not present

## 2018-03-20 DIAGNOSIS — Z7984 Long term (current) use of oral hypoglycemic drugs: Secondary | ICD-10-CM | POA: Insufficient documentation

## 2018-03-20 DIAGNOSIS — Z8719 Personal history of other diseases of the digestive system: Secondary | ICD-10-CM | POA: Insufficient documentation

## 2018-03-20 DIAGNOSIS — I2510 Atherosclerotic heart disease of native coronary artery without angina pectoris: Secondary | ICD-10-CM | POA: Diagnosis not present

## 2018-03-20 HISTORY — PX: CARDIOVERSION: SHX1299

## 2018-03-20 LAB — GLUCOSE, CAPILLARY: Glucose-Capillary: 94 mg/dL (ref 70–99)

## 2018-03-20 SURGERY — CARDIOVERSION
Anesthesia: General

## 2018-03-20 MED ORDER — AMIODARONE HCL 200 MG PO TABS: 200.0000 mg | ORAL_TABLET | Freq: Every day | ORAL | 11 refills | Status: AC

## 2018-03-20 MED ORDER — LIDOCAINE HCL (CARDIAC) PF 100 MG/5ML IV SOSY
PREFILLED_SYRINGE | INTRAVENOUS | Status: DC | PRN
  Administered 2018-03-20: 20 mg via INTRATRACHEAL

## 2018-03-20 MED ORDER — PROPOFOL 10 MG/ML IV BOLUS
INTRAVENOUS | Status: DC | PRN
  Administered 2018-03-20: 60 mg via INTRAVENOUS

## 2018-03-20 MED ORDER — SODIUM CHLORIDE 0.9 % IV SOLN
INTRAVENOUS | Status: DC
  Administered 2018-03-20: 13:00:00 via INTRAVENOUS

## 2018-03-20 NOTE — Anesthesia Postprocedure Evaluation (Signed)
Anesthesia Post Note  Patient: Elijah Saunders  Procedure(s) Performed: CARDIOVERSION (N/A )     Patient location during evaluation: Endoscopy Anesthesia Type: General Level of consciousness: awake and alert Pain management: pain level controlled Vital Signs Assessment: post-procedure vital signs reviewed and stable Respiratory status: spontaneous breathing, nonlabored ventilation, respiratory function stable and patient connected to nasal cannula oxygen Cardiovascular status: blood pressure returned to baseline and stable Postop Assessment: no apparent nausea or vomiting Anesthetic complications: no    Last Vitals:  Vitals:   03/20/18 1310 03/20/18 1320  BP: 106/69 122/75  Pulse: 82 78  Resp: 20 15  Temp: 36.6 C   SpO2: 95% 92%    Last Pain:  Vitals:   03/20/18 1320  TempSrc:   PainSc: 0-No pain                 Wells Gerdeman COKER

## 2018-03-20 NOTE — Anesthesia Procedure Notes (Signed)
Procedure Name: MAC Date/Time: 03/20/2018 1:09 PM Performed by: Marsa Aris, CRNA Pre-anesthesia Checklist: Patient identified, Emergency Drugs available, Suction available, Patient being monitored and Timeout performed Patient Re-evaluated:Patient Re-evaluated prior to induction Oxygen Delivery Method: Ambu bag Preoxygenation: Pre-oxygenation with 100% oxygen

## 2018-03-20 NOTE — Interval H&P Note (Signed)
History and Physical Interval Note:  03/20/2018 1:21 PM  Ericka PontiffHoward L Berquist  has presented today for surgery, with the diagnosis of AFIB  The various methods of treatment have been discussed with the patient and family. After consideration of risks, benefits and other options for treatment, the patient has consented to  Procedure(s): CARDIOVERSION (N/A) as a surgical intervention .  The patient's history has been reviewed, patient examined, no change in status, stable for surgery.  I have reviewed the patient's chart and labs.  Questions were answered to the patient's satisfaction.     Kristeen MissPhilip Tod Abrahamsen

## 2018-03-20 NOTE — Transfer of Care (Signed)
Immediate Anesthesia Transfer of Care Note  Patient: Elijah Saunders  Procedure(s) Performed: CARDIOVERSION (N/A )  Patient Location: Endoscopy Unit  Anesthesia Type:MAC  Level of Consciousness: awake, alert  and oriented  Airway & Oxygen Therapy: Patient Spontanous Breathing  Post-op Assessment: Report given to RN and Post -op Vital signs reviewed and stable  Post vital signs: Reviewed and stable  Last Vitals:  Vitals Value Taken Time  BP    Temp    Pulse    Resp    SpO2      Last Pain:  Vitals:   03/20/18 1212  TempSrc: Oral  PainSc: 0-No pain         Complications: No apparent anesthesia complications

## 2018-03-20 NOTE — Anesthesia Preprocedure Evaluation (Signed)
Anesthesia Evaluation  Patient identified by MRN, date of birth, ID band Patient awake    Reviewed: Allergy & Precautions, NPO status , Patient's Chart, lab work & pertinent test results  Airway Mallampati: II  TM Distance: >3 FB Neck ROM: Full    Dental   Pulmonary former smoker,    breath sounds clear to auscultation       Cardiovascular hypertension,  Rhythm:Irregular Rate:Normal     Neuro/Psych    GI/Hepatic   Endo/Other    Renal/GU      Musculoskeletal   Abdominal   Peds  Hematology   Anesthesia Other Findings   Reproductive/Obstetrics                             Anesthesia Physical Anesthesia Plan  ASA: III  Anesthesia Plan: General   Post-op Pain Management:    Induction: Intravenous  PONV Risk Score and Plan: Propofol infusion  Airway Management Planned: Mask  Additional Equipment:   Intra-op Plan:   Post-operative Plan:   Informed Consent: I have reviewed the patients History and Physical, chart, labs and discussed the procedure including the risks, benefits and alternatives for the proposed anesthesia with the patient or authorized representative who has indicated his/her understanding and acceptance.     Plan Discussed with: CRNA and Anesthesiologist  Anesthesia Plan Comments:         Anesthesia Quick Evaluation  

## 2018-03-20 NOTE — CV Procedure (Signed)
    Cardioversion Note  Elijah Saunders [id number] 10/16/1933  Procedure: DC Cardioversion Indications: Atrial fib   Procedure Details Consent: Obtained Time Out: Verified patient identification, verified procedure, site/side was marked, verified correct patient position, special equipment/implants available, Radiology Safety Procedures followed,  medications/allergies/relevent history reviewed, required imaging and test results available.  Performed  The patient has been on adequate anticoagulation.  The patient received IV Lidocaine 40 mg IV followed by Propofol 60 mg IV  for sedation.  Synchronous cardioversion was performed at 120  joules.  The cardioversion was successful.     Complications: No apparent complications Patient did tolerate procedure well.   Vesta MixerPhilip J. Nahser, [name], MD, [facility] CenterFACC 03/20/2018, 1:22 PM

## 2018-03-20 NOTE — Discharge Instructions (Signed)
Electrical Cardioversion, Care After °This sheet gives you information about how to care for yourself after your procedure. Your health care provider may also give you more specific instructions. If you have problems or questions, contact your health care provider. °What can I expect after the procedure? °After the procedure, it is common to have: °· Some redness on the skin where the shocks were given. ° °Follow these instructions at home: °· Do not drive for 24 hours if you were given a medicine to help you relax (sedative). °· Take over-the-counter and prescription medicines only as told by your health care provider. °· Ask your health care provider how to check your pulse. Check it often. °· Rest for 48 hours after the procedure or as told by your health care provider. °· Avoid or limit your caffeine use as told by your health care provider. °Contact a health care provider if: °· You feel like your heart is beating too quickly or your pulse is not regular. °· You have a serious muscle cramp that does not go away. °Get help right away if: °· You have discomfort in your chest. °· You are dizzy or you feel faint. °· You have trouble breathing or you are short of breath. °· Your speech is slurred. °· You have trouble moving an arm or leg on one side of your body. °· Your fingers or toes turn cold or blue. °This information is not intended to replace advice given to you by your health care provider. Make sure you discuss any questions you have with your health care provider. °Document Released: 01/29/2013 Document Revised: 11/12/2015 Document Reviewed: 10/15/2015 °Elsevier Interactive Patient Education © 2018 [employer] ° °

## 2018-03-21 ENCOUNTER — Encounter (HOSPITAL_COMMUNITY): Payer: Self-pay | Admitting: Cardiovascular Disease

## 2018-03-27 ENCOUNTER — Other Ambulatory Visit: Payer: Medicare Other

## 2018-03-28 ENCOUNTER — Other Ambulatory Visit: Payer: Self-pay

## 2018-03-28 ENCOUNTER — Ambulatory Visit (HOSPITAL_COMMUNITY): Payer: Medicare Other | Attending: Cardiovascular Disease

## 2018-03-28 DIAGNOSIS — I4819 Other persistent atrial fibrillation: Secondary | ICD-10-CM | POA: Diagnosis not present

## 2018-03-28 DIAGNOSIS — I5022 Chronic systolic (congestive) heart failure: Secondary | ICD-10-CM | POA: Diagnosis not present

## 2018-03-28 MED ORDER — PERFLUTREN LIPID MICROSPHERE
1.0000 mL | INTRAVENOUS | Status: AC | PRN
  Administered 2018-03-28: 2 mL via INTRAVENOUS

## 2018-04-02 ENCOUNTER — Telehealth: Payer: Self-pay | Admitting: Cardiovascular Disease

## 2018-04-02 NOTE — Telephone Encounter (Signed)
New message    Patient was returning call about echo

## 2018-04-02 NOTE — Telephone Encounter (Signed)
-----   Message from Vesta MixerPhilip J Nahser, MD sent at 03/29/2018  9:29 AM EST ----- EF has decreased He is on metoprolol He did not tolerate Coreg ( fatigue , hypotension) And did not tolerate ACE-inhibitor / ARB due to developing acute renal insufficiency Continue lasix  Work him into a slot with me or APP in the next several weeks

## 2018-04-02 NOTE — Telephone Encounter (Signed)
Reviewed echo results and plan to continue current medications until office visit with Dr. Elease HashimotoNahser on 12/18. Patient verbalized understanding and agreement and will call back with worsening symptoms or other concerns prior to appointment. He thanked me for the call.

## 2018-04-10 ENCOUNTER — Ambulatory Visit: Payer: Medicare Other | Admitting: Cardiovascular Disease

## 2018-04-10 ENCOUNTER — Encounter: Payer: Self-pay | Admitting: Cardiovascular Disease

## 2018-04-10 VITALS — BP 130/70 | HR 50 | Ht 68.0 in | Wt 142.1 lb

## 2018-04-10 DIAGNOSIS — I4819 Other persistent atrial fibrillation: Secondary | ICD-10-CM | POA: Diagnosis not present

## 2018-04-10 DIAGNOSIS — I2510 Atherosclerotic heart disease of native coronary artery without angina pectoris: Secondary | ICD-10-CM | POA: Diagnosis not present

## 2018-04-10 DIAGNOSIS — E782 Mixed hyperlipidemia: Secondary | ICD-10-CM | POA: Diagnosis not present

## 2018-04-10 NOTE — Patient Instructions (Signed)
Medication Instructions:  Your physician recommends that you continue on your current medications as directed. Please refer to the Current Medication list given to you today.  If you need a refill on your cardiac medications before your next appointment, please call your pharmacy.   Lab work: Your physician recommends that you return for lab work in: 3 months on the day of or a few days before your office visit with Dr. Elease HashimotoNahser.  You will need to FAST for this appointment - nothing to eat or drink after midnight the night before except water.    Testing/Procedures: None Ordered   Follow-Up: [facility], you and your health needs are our priority.  As part of our continuing mission to provide you with exceptional heart care, we have created designated Provider Care Teams.  These Care Teams include your primary Cardiologist (physician) and Advanced Practice Providers (APPs -  Physician Assistants and Nurse Practitioners) who all work together to provide you with the care you need, when you need it. You will need a follow up appointment in:  3 months.  You may see Kristeen MissPhilip Nahser, MD or one of the following Advanced Practice Providers on your designated Care Team: Tereso NewcomerScott Weaver, PA-C Vin WainwrightBhagat, [place] JerseyPA-C . Berton BonJanine Hammond, NP
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
## 2018-04-29 ENCOUNTER — Other Ambulatory Visit: Payer: Self-pay | Admitting: Cardiovascular Disease

## 2018-04-29 NOTE — Telephone Encounter (Signed)
Eliquis 2.5mg  refill request received; pt is 85 yrs old, Wt-64.5kg, Crea-1.63 on 03/13/18, last seen by Dr. [name] on 04/10/18; will send in refill to requested pharmacy.

## 2018-06-04 ENCOUNTER — Ambulatory Visit: Payer: Medicare Other | Admitting: Physician Assistant

## 2018-06-24 ENCOUNTER — Other Ambulatory Visit: Payer: Self-pay | Admitting: Cardiovascular Disease

## 2018-06-24 DIAGNOSIS — I2510 Atherosclerotic heart disease of native coronary artery without angina pectoris: Secondary | ICD-10-CM

## 2018-06-24 DIAGNOSIS — I5022 Chronic systolic (congestive) heart failure: Secondary | ICD-10-CM

## 2018-07-08 ENCOUNTER — Telehealth: Payer: Self-pay | Admitting: Nurse Practitioner

## 2018-07-08 NOTE — Telephone Encounter (Signed)
Called patient to discuss appointment with Dr. [name] on 3/20 due to Covid 19. Patient requests to be rescheduled at a later time and will get fasting lab work at that visit.

## 2018-07-11 NOTE — Telephone Encounter (Signed)
  Covid rescheduling level 2  patient has several serious issues but he is well conpensated at this time    [name], MD  07/11/2018 4:38 PM    [facility] [address] [place],  [address] [place], [place]  [address] Pager [phone] Phone: [phone]; Fax: [phone]

## 2018-07-12 ENCOUNTER — Ambulatory Visit: Payer: Medicare Other | Admitting: Cardiovascular Disease

## 2018-08-02 ENCOUNTER — Telehealth: Payer: Self-pay | Admitting: Cardiovascular Disease

## 2018-08-02 NOTE — Telephone Encounter (Signed)
Left message for patient to call me back to reschedule his March appointment with Dr. [name].

## 2018-08-02 NOTE — Telephone Encounter (Signed)
Patient returned my call and scheduled his appointment with Bhagat for 08/08/2018

## 2018-08-08 ENCOUNTER — Encounter: Payer: Self-pay | Admitting: Physician Assistant

## 2018-08-08 ENCOUNTER — Telehealth (INDEPENDENT_AMBULATORY_CARE_PROVIDER_SITE_OTHER): Payer: Medicare Other | Admitting: Physician Assistant

## 2018-08-08 ENCOUNTER — Other Ambulatory Visit: Payer: Self-pay

## 2018-08-08 VITALS — BP 118/58 | HR 47 | Ht 68.0 in | Wt 133.0 lb

## 2018-08-08 DIAGNOSIS — I2510 Atherosclerotic heart disease of native coronary artery without angina pectoris: Secondary | ICD-10-CM

## 2018-08-08 DIAGNOSIS — N183 Chronic kidney disease, stage 3 unspecified: Secondary | ICD-10-CM

## 2018-08-08 DIAGNOSIS — Z7189 Other specified counseling: Secondary | ICD-10-CM

## 2018-08-08 DIAGNOSIS — I4819 Other persistent atrial fibrillation: Secondary | ICD-10-CM

## 2018-08-08 DIAGNOSIS — I5022 Chronic systolic (congestive) heart failure: Secondary | ICD-10-CM

## 2018-08-08 DIAGNOSIS — E782 Mixed hyperlipidemia: Secondary | ICD-10-CM

## 2018-08-08 NOTE — Progress Notes (Signed)
Virtual Visit via Video Note   This visit type was conducted due to national recommendations for restrictions regarding the COVID-19 Pandemic (e.g. social distancing) in an effort to limit this patient's exposure and mitigate transmission in our community.  Due to his co-morbid illnesses, this patient is at least at moderate risk for complications without adequate follow up.  This format is felt to be most appropriate for this patient at this time.  All issues noted in this document were discussed and addressed.  A limited physical exam was performed with this format.  Please refer to the patient's chart for his consent to telehealth [facility].   Evaluation Performed:  Follow-up visit  Date:  08/08/2018   ID:  Elijah Saunders, DOB 05/16/1933, MRN [id number]  Patient Location: Home Provider Location: Home  PCP:  Aida PufferLittle, [name], MD  Cardiologist:  Kristeen MissPhilip Nahser, MD  Electrophysiologist:  Lewayne BuntingGregg Taylor, MD   Chief Complaint:  3 months follow up  History of Present Illness:    Elijah Saunders is a 85 y.o. male with hx of coronary artery disease status post prior anterior and inferior myocardial infarction, status post CABG in 2003, ischemic cardiomyopathy, chronic systolic heart failure, abdominal aortic aneurysm s/p prior repair, persistent atrial fibrillation, chronic kidney disease, status post left nephrectomy, hypertension, hyperlipidemia seen for follow up.   The patient does/does not have symptoms concerning for COVID-19 infection (fever, chills, cough, or new shortness of breath).   Cardiac catheterization in August 2018 demonstrated severe native CAD with an occluded RCA, LCx and LAD.  LIMA-LAD, SVG-RCA were patent.  The vein grafts to the diagonal and obtuse marginal were both occluded.  He was evaluated by Dr. Ladona Ridgelaylor in September 2018 for consideration of prophylactic ICD implantation.  Given his advanced age, it was decided to hold off on ICD implantation unless he develops  syncope or ventricular tachycardia.   Last echo 03/28/09 showed LVEF of 20-25% and diffuse hypokinesis. He did not tolerate Coreg ( fatigue , hypotension) and did not tolerate ACE-inhibitor / ARB due to developing acute renal insufficiency.  He was doing well on cardiac stand point when last seen by Dr.Nahser 04/10/2018.   Patient seen by virtual visit.  In good spirits.  He has no rhythm issue.  Denies chest pain, shortness of breath, orthopnea, PND, syncope, lower extremity edema, melena or blood in his stool or urine.  Walks around the house without any issue.   Past Medical History:  Diagnosis Date  . AAA (abdominal aortic aneurysm) [facility](HCC) 2003   s/p AAA R&G June 2003  . CAD (coronary artery disease) 2003   s/p CABG, 2003   . CHF (congestive heart failure) (HCC)    EF 35-40% by echo, May 2017  . Chronic renal insufficiency, stage III (moderate) (HCC)   . HTN (hypertension)   . Hypercholesteremia    statin intol  . MI (myocardial infarction) (HCC)   . SBO (small bowel obstruction) [facility](HCC) Nov 2002   Past Surgical History:  Procedure Laterality Date  . ABDOMINAL AORTIC ANEURYSM REPAIR  June 2003  . CARDIOVERSION N/A 09/08/2015   Procedure: CARDIOVERSION;  Surgeon: [name], MD;  [facility];  Service: Cardiovascular;  Laterality: N/A;  . CARDIOVERSION N/A 03/20/2018   Procedure: CARDIOVERSION;  Surgeon: Vesta MixerNahser, Philip J, MD;  [facility];  Service: Cardiovascular;  Laterality: N/A;  . CORONARY ARTERY BYPASS GRAFT  March 2003   x 4  . HERNIA REPAIR    . NEPHRECTOMY  Left    1955  . RIGHT/LEFT HEART CATH AND CORONARY/GRAFT ANGIOGRAPHY N/A 12/19/2016   Procedure: RIGHT/LEFT HEART CATH AND CORONARY/GRAFT ANGIOGRAPHY;  Surgeon: Lyn Records, MD;  Location: [facility] INVASIVE CV LAB;  Service: Cardiovascular;  Laterality: N/A;  . TEE WITHOUT CARDIOVERSION N/A 09/08/2015   Procedure: TRANSESOPHAGEAL ECHOCARDIOGRAM (TEE);  Surgeon: Laurey Morale, MD;  Location: [facility]  ENDOSCOPY;  Service: Cardiovascular;  Laterality: N/A;     Current Meds  Medication Sig  . ACCU-CHEK SMARTVIEW test strip Use as directed to test blood glucose each morning.  Marland Kitchen allopurinol (ZYLOPRIM) 300 MG tablet Take 300 mg by mouth daily.  Marland Kitchen amiodarone (PACERONE) 200 MG tablet Take 1 tablet (200 mg total) by mouth daily.  . cyanocobalamin 500 MCG tablet Take 500 mcg by mouth daily.   Marland Kitchen ELIQUIS 2.5 MG TABS tablet TAKE 1 TABLET BY MOUTH TWICE DAILY  . furosemide (LASIX) 40 MG tablet Take 1 tablet (40 mg total) by mouth 2 (two) times daily.  Marland Kitchen glimepiride (AMARYL) 1 MG tablet Take 1 mg by mouth daily with lunch.   . isosorbide mononitrate (IMDUR) 30 MG 24 hr tablet TAKE 1 TABLET BY MOUTH DAILY  . levothyroxine (SYNTHROID, LEVOTHROID) 100 MCG tablet Take 100 mcg by mouth daily before breakfast.  . Menthol, Topical Analgesic, (BLUE-EMU MAXIMUM STRENGTH EX) Apply 1 application topically 3 (three) times daily as needed (for knee pain.). For sore muscles.   . metFORMIN (GLUCOPHAGE) 500 MG tablet Take 500 mg by mouth 2 (two) times daily.   . metoprolol tartrate (LOPRESSOR) 25 MG tablet Take 1 tablet (25 mg total) by mouth 2 (two) times daily.  . Multiple Vitamins-Minerals (PRESERVISION AREDS 2) CAPS Take 1 capsule by mouth 2 (two) times daily.  Bertram Gala Glycol-Propyl Glycol (SYSTANE ULTRA) 0.4-0.3 % SOLN Place 1 drop into both eyes daily.  . Vitamin D, Ergocalciferol, (DRISDOL) 50000 units CAPS capsule Take 50,000 Units by mouth every Wednesday.     Allergies:   Ace inhibitors; Atorvastatin; Crestor [rosuvastatin calcium]; Lopid [gemfibrozil]; Penicillins; and Zetia [ezetimibe]   Social History   Tobacco Use  . Smoking status: Former Smoker    Packs/day: 1.00    Years: 40.00    Pack years: 40.00    Types: Cigarettes    Last attempt to quit: 04/25/1995    Years since quitting: 23.3  . Smokeless tobacco: Never Used  Substance Use Topics  . Alcohol use: No    Alcohol/week: 0.0 standard  drinks  . Drug use: No     Family Hx: The patient's family history includes Alzheimer's disease in his mother; Cancer - Cervical in his mother; Coronary artery disease in his father; Diabetes in his father and mother; Gallbladder disease in his mother.  ROS:   Please see the history of present illness.    All other systems reviewed and are negative.   Prior CV studies:   The following studies were reviewed today:  Echo 03/28/18 Study Conclusions  - Left ventricle: The cavity size was moderately to severely   dilated. Systolic function was severely reduced. The estimated   ejection fraction was in the range of 20% to 25%. Diffuse   hypokinesis. Akinesis of the anteroseptal and anterior   myocardium. Acoustic contrast opacification revealed no evidence   ofthrombus. - Aortic valve: There was mild stenosis. There was moderate   regurgitation. - Mitral valve: There was moderate to severe regurgitation. - Left atrium: The atrium was severely dilated. - Right ventricle: The cavity size was  moderately dilated. Wall   thickness was normal. Systolic function was moderately reduced. - Right atrium: The atrium was moderately to severely dilated. - Tricuspid valve: There was moderate regurgitation. - Pulmonic valve: There was moderate regurgitation. - Pulmonary arteries: Systolic pressure was moderately to severely   increased. PA peak pressure: 57 mm Hg (S).  Impressions:  - Severe LV dysfunction and dilation. Likely severe MR (borderline   by PISA), carpentier class 3b. Echo contrast used to define wall   motion and exclude LV thrombus. Mild AR with mild AS, biatrial   dilation, moderate TR, moderate PR. Elevated right sided   pressures.  RIGHT/LEFT HEART CATH AND CORONARY/GRAFT ANGIOGRAPHY  11/2016  Conclusion    Left ventricular systolic dysfunction with regional wall motion abnormality involving the inferior wall and apex greater than the anterior wall. Estimated ejection  fraction 25-30%. LVEDP 18 millimeters mercury and consistent with chronic systolic heart failure.  Severe native vessel coronary disease with total occlusion of the RCA, mid circumflex, and mid LAD. Ostial 50% left main narrowing.  Bypass graft failure with occlusion of the graft to the diagonal and to the obtuse marginal.  Patent LIMA to the mid to distal LAD.   Patent saphenous vein graft to the distal RCA.  Normal right heart pressures including pulmonary capillary wedge of 10 mmHg  RECOMMENDATIONS:   GDMT and possible EP consultation.     Labs/Other Tests and Data Reviewed:    EKG:  No ECG reviewed.  Recent Labs: 01/14/2018: B Natriuretic Peptide 1,223.3 03/13/2018: ALT 266; BUN 43; Creatinine, Ser 1.63; Hemoglobin 11.1; Platelets 280; Potassium 3.9; Sodium 137; TSH 1.960   Recent Lipid Panel Lab Results  Component Value Date/Time   CHOL 171 02/05/2018 07:56 AM   TRIG 168 (H) 02/05/2018 07:56 AM   HDL 36 (L) 02/05/2018 07:56 AM   CHOLHDL 4.8 02/05/2018 07:56 AM   CHOLHDL 6.6 09/06/2015 03:40 AM   LDLCALC 101 (H) 02/05/2018 07:56 AM   LDLDIRECT 125.2 11/06/2011 08:42 AM    Wt Readings from Last 3 Encounters:  08/08/18 133 lb (60.3 kg)  04/10/18 142 lb 1.9 oz (64.5 kg)  03/20/18 151 lb (68.5 kg)     Objective:    Vital Signs:  BP (!) 118/58   Pulse (!) 47   Ht  (1.727 m)   Wt 133 lb (60.3 kg)   BMI 20.22 kg/m    Well nourished, well developed male in no acute distress. Good spirited.  Patient thinks he is in sinus rhythm.  Good affect.  Answer question appropriately.  ASSESSMENT & PLAN:    1. PAF -Maintaining sinus rhythm.  Continue Eliquis and amiodarone.  No bleeding issue.  2. Chronic systolic CHF -No dyspnea, orthopnea, PND or lower extremity edema.  Continue Lasix.  Due to COVID-19 pandemic will schedule lab work next time.  3. CAD s/p CABG -No angina.  Continue current medical therapy.  4. HTN -Blood pressure stable on current  medication.  5. HLD -Continue statin. COVID-19 Education: The signs and symptoms of COVID-19 were discussed with the patient and how to seek care for testing (follow up with PCP or arrange E-visit). The importance of social distancing was discussed today.  Time:   Today, I have spent 10 minutes with the patient with telehealth technology discussing the above problems.     Medication Adjustments/Labs and Tests Ordered: Current medicines are reviewed at length with the patient today.  Concerns regarding medicines are outlined above.   Tests Ordered: No  orders of the defined types were placed in this encounter.   Medication Changes: No orders of the defined types were placed in this encounter.   Disposition:  Follow up in 3 month(s)  Signed, Manson Passey, PA  08/08/2018 9:49 AM    [facility]

## 2018-08-08 NOTE — Patient Instructions (Signed)
Medication Instructions:  none If you need a refill on your cardiac medications before your next appointment, please call your pharmacy.   Lab work: none If you have labs (blood work) drawn today and your tests are completely normal, you will receive your results only by: Marland Kitchen MyChart Message (if you have MyChart) OR . A paper copy in the mail If you have any lab test that is abnormal or we need to change your treatment, we will call you to review the results.  Testing/Procedures: none  Follow-Up: 3 months with Dr [name] At [facility], you and your health needs are our priority.  As part of our continuing mission to provide you with exceptional heart care, we have created designated Provider Care Teams.  These Care Teams include your primary Cardiologist (physician) and Advanced Practice Providers (APPs -  Physician Assistants and Nurse Practitioners) who all work together to provide you with the care you need, when you need it.  Any Other Special Instructions Will Be Listed Below (If Applicable).

## 2018-08-19 ENCOUNTER — Other Ambulatory Visit: Payer: Self-pay

## 2018-08-19 ENCOUNTER — Emergency Department (HOSPITAL_COMMUNITY): Payer: Medicare Other

## 2018-08-19 ENCOUNTER — Encounter (HOSPITAL_COMMUNITY): Payer: Self-pay | Admitting: Emergency Medicine

## 2018-08-19 ENCOUNTER — Inpatient Hospital Stay (HOSPITAL_COMMUNITY): Payer: Medicare Other

## 2018-08-19 ENCOUNTER — Inpatient Hospital Stay (HOSPITAL_COMMUNITY)
Admission: EM | Admit: 2018-08-19 | Discharge: 2018-08-23 | DRG: 481 | Disposition: A | Discharge status: Rehabilitation Facility | Payer: Medicare Other | Attending: Internal Medicine | Admitting: Internal Medicine

## 2018-08-19 DIAGNOSIS — W1830XD Fall on same level, unspecified, subsequent encounter: Secondary | ICD-10-CM | POA: Diagnosis not present

## 2018-08-19 DIAGNOSIS — N179 Acute kidney failure, unspecified: Secondary | ICD-10-CM | POA: Diagnosis present

## 2018-08-19 DIAGNOSIS — E7800 Pure hypercholesterolemia, unspecified: Secondary | ICD-10-CM | POA: Diagnosis present

## 2018-08-19 DIAGNOSIS — I482 Chronic atrial fibrillation, unspecified: Secondary | ICD-10-CM | POA: Diagnosis present

## 2018-08-19 DIAGNOSIS — N390 Urinary tract infection, site not specified: Secondary | ICD-10-CM | POA: Diagnosis present

## 2018-08-19 DIAGNOSIS — Z820 Family history of epilepsy and other diseases of the nervous system: Secondary | ICD-10-CM | POA: Diagnosis not present

## 2018-08-19 DIAGNOSIS — S72001A Fracture of unspecified part of neck of right femur, initial encounter for closed fracture: Secondary | ICD-10-CM | POA: Diagnosis not present

## 2018-08-19 DIAGNOSIS — E1122 Type 2 diabetes mellitus with diabetic chronic kidney disease: Secondary | ICD-10-CM | POA: Diagnosis present

## 2018-08-19 DIAGNOSIS — Z833 Family history of diabetes mellitus: Secondary | ICD-10-CM | POA: Diagnosis not present

## 2018-08-19 DIAGNOSIS — Z905 Acquired absence of kidney: Secondary | ICD-10-CM

## 2018-08-19 DIAGNOSIS — N401 Enlarged prostate with lower urinary tract symptoms: Secondary | ICD-10-CM | POA: Diagnosis present

## 2018-08-19 DIAGNOSIS — I5032 Chronic diastolic (congestive) heart failure: Secondary | ICD-10-CM | POA: Diagnosis present

## 2018-08-19 DIAGNOSIS — Z6820 Body mass index (BMI) 20.0-20.9, adult: Secondary | ICD-10-CM | POA: Diagnosis not present

## 2018-08-19 DIAGNOSIS — S72141D Displaced intertrochanteric fracture of right femur, subsequent encounter for closed fracture with routine healing: Secondary | ICD-10-CM | POA: Diagnosis present

## 2018-08-19 DIAGNOSIS — I2510 Atherosclerotic heart disease of native coronary artery without angina pectoris: Secondary | ICD-10-CM | POA: Diagnosis present

## 2018-08-19 DIAGNOSIS — E785 Hyperlipidemia, unspecified: Secondary | ICD-10-CM | POA: Diagnosis present

## 2018-08-19 DIAGNOSIS — Z79899 Other long term (current) drug therapy: Secondary | ICD-10-CM

## 2018-08-19 DIAGNOSIS — I130 Hypertensive heart and chronic kidney disease with heart failure and stage 1 through stage 4 chronic kidney disease, or unspecified chronic kidney disease: Secondary | ICD-10-CM | POA: Diagnosis present

## 2018-08-19 DIAGNOSIS — N183 Chronic kidney disease, stage 3 unspecified: Secondary | ICD-10-CM | POA: Diagnosis present

## 2018-08-19 DIAGNOSIS — Z7984 Long term (current) use of oral hypoglycemic drugs: Secondary | ICD-10-CM

## 2018-08-19 DIAGNOSIS — Z888 Allergy status to other drugs, medicaments and biological substances status: Secondary | ICD-10-CM

## 2018-08-19 DIAGNOSIS — Z79890 Hormone replacement therapy (postmenopausal): Secondary | ICD-10-CM

## 2018-08-19 DIAGNOSIS — I340 Nonrheumatic mitral (valve) insufficiency: Secondary | ICD-10-CM | POA: Diagnosis present

## 2018-08-19 DIAGNOSIS — E1129 Type 2 diabetes mellitus with other diabetic kidney complication: Secondary | ICD-10-CM | POA: Diagnosis present

## 2018-08-19 DIAGNOSIS — I447 Left bundle-branch block, unspecified: Secondary | ICD-10-CM | POA: Diagnosis present

## 2018-08-19 DIAGNOSIS — S72141A Displaced intertrochanteric fracture of right femur, initial encounter for closed fracture: Secondary | ICD-10-CM | POA: Diagnosis present

## 2018-08-19 DIAGNOSIS — Z8679 Personal history of other diseases of the circulatory system: Secondary | ICD-10-CM | POA: Diagnosis not present

## 2018-08-19 DIAGNOSIS — W010XXA Fall on same level from slipping, tripping and stumbling without subsequent striking against object, initial encounter: Secondary | ICD-10-CM | POA: Diagnosis present

## 2018-08-19 DIAGNOSIS — E039 Hypothyroidism, unspecified: Secondary | ICD-10-CM | POA: Diagnosis present

## 2018-08-19 DIAGNOSIS — Z951 Presence of aortocoronary bypass graft: Secondary | ICD-10-CM

## 2018-08-19 DIAGNOSIS — K5900 Constipation, unspecified: Secondary | ICD-10-CM | POA: Diagnosis present

## 2018-08-19 DIAGNOSIS — I4819 Other persistent atrial fibrillation: Secondary | ICD-10-CM | POA: Diagnosis present

## 2018-08-19 DIAGNOSIS — R319 Hematuria, unspecified: Secondary | ICD-10-CM | POA: Diagnosis present

## 2018-08-19 DIAGNOSIS — Z880 Allergy status to penicillin: Secondary | ICD-10-CM | POA: Diagnosis not present

## 2018-08-19 DIAGNOSIS — I252 Old myocardial infarction: Secondary | ICD-10-CM

## 2018-08-19 DIAGNOSIS — Z8249 Family history of ischemic heart disease and other diseases of the circulatory system: Secondary | ICD-10-CM

## 2018-08-19 DIAGNOSIS — S72144A Nondisplaced intertrochanteric fracture of right femur, initial encounter for closed fracture: Secondary | ICD-10-CM | POA: Diagnosis present

## 2018-08-19 DIAGNOSIS — D62 Acute posthemorrhagic anemia: Secondary | ICD-10-CM | POA: Diagnosis not present

## 2018-08-19 DIAGNOSIS — I5022 Chronic systolic (congestive) heart failure: Secondary | ICD-10-CM | POA: Diagnosis present

## 2018-08-19 DIAGNOSIS — Z87891 Personal history of nicotine dependence: Secondary | ICD-10-CM

## 2018-08-19 DIAGNOSIS — S72002A Fracture of unspecified part of neck of left femur, initial encounter for closed fracture: Secondary | ICD-10-CM

## 2018-08-19 DIAGNOSIS — W19XXXA Unspecified fall, initial encounter: Secondary | ICD-10-CM

## 2018-08-19 DIAGNOSIS — E872 Acidosis: Secondary | ICD-10-CM | POA: Diagnosis present

## 2018-08-19 DIAGNOSIS — E440 Moderate protein-calorie malnutrition: Secondary | ICD-10-CM | POA: Diagnosis present

## 2018-08-19 DIAGNOSIS — I255 Ischemic cardiomyopathy: Secondary | ICD-10-CM | POA: Diagnosis present

## 2018-08-19 DIAGNOSIS — Z7901 Long term (current) use of anticoagulants: Secondary | ICD-10-CM

## 2018-08-19 DIAGNOSIS — E876 Hypokalemia: Secondary | ICD-10-CM | POA: Diagnosis not present

## 2018-08-19 DIAGNOSIS — E860 Dehydration: Secondary | ICD-10-CM | POA: Diagnosis present

## 2018-08-19 DIAGNOSIS — I714 Abdominal aortic aneurysm, without rupture: Secondary | ICD-10-CM | POA: Diagnosis present

## 2018-08-19 DIAGNOSIS — J449 Chronic obstructive pulmonary disease, unspecified: Secondary | ICD-10-CM | POA: Diagnosis present

## 2018-08-19 DIAGNOSIS — S72141S Displaced intertrochanteric fracture of right femur, sequela: Secondary | ICD-10-CM | POA: Diagnosis not present

## 2018-08-19 DIAGNOSIS — E11649 Type 2 diabetes mellitus with hypoglycemia without coma: Secondary | ICD-10-CM | POA: Diagnosis not present

## 2018-08-19 DIAGNOSIS — Z419 Encounter for procedure for purposes other than remedying health state, unspecified: Secondary | ICD-10-CM

## 2018-08-19 DIAGNOSIS — E119 Type 2 diabetes mellitus without complications: Secondary | ICD-10-CM | POA: Diagnosis not present

## 2018-08-19 LAB — COMPREHENSIVE METABOLIC PANEL
ALT: 44 U/L (ref 0–44)
AST: 37 U/L (ref 15–41)
Albumin: 3.3 g/dL — ABNORMAL LOW (ref 3.5–5.0)
Alkaline Phosphatase: 74 U/L (ref 38–126)
Anion gap: 13 (ref 5–15)
BUN: 109 mg/dL — ABNORMAL HIGH (ref 8–23)
CO2: 22 mmol/L (ref 22–32)
Calcium: 9.2 mg/dL (ref 8.9–10.3)
Chloride: 100 mmol/L (ref 98–111)
Creatinine, Ser: 6.18 mg/dL — ABNORMAL HIGH (ref 0.61–1.24)
GFR calc Af Amer: 9 mL/min — ABNORMAL LOW (ref >60)
GFR calc non Af Amer: 8 mL/min — ABNORMAL LOW (ref >60)
Glucose, Bld: 56 mg/dL — ABNORMAL LOW (ref 70–99)
Potassium: 3.6 mmol/L (ref 3.5–5.1)
Sodium: 135 mmol/L (ref 135–145)
Total Bilirubin: 0.7 mg/dL (ref 0.3–1.2)
Total Protein: 6.9 g/dL (ref 6.5–8.1)

## 2018-08-19 LAB — URINALYSIS, ROUTINE W REFLEX MICROSCOPIC
Bilirubin Urine: NEGATIVE
Glucose, UA: NEGATIVE mg/dL
Hgb urine dipstick: NEGATIVE
Ketones, ur: NEGATIVE mg/dL
Leukocytes,Ua: NEGATIVE
Nitrite: NEGATIVE
Protein, ur: NEGATIVE mg/dL
Specific Gravity, Urine: 1.01 (ref 1.005–1.030)
pH: 5 (ref 5.0–8.0)

## 2018-08-19 LAB — CBC WITH DIFFERENTIAL/PLATELET
Abs Immature Granulocytes: 0.12 10*3/uL — ABNORMAL HIGH (ref 0.00–0.07)
Basophils Absolute: 0 10*3/uL (ref 0.0–0.1)
Basophils Relative: 0 %
Eosinophils Absolute: 0.1 10*3/uL (ref 0.0–0.5)
Eosinophils Relative: 1 %
HCT: 31 % — ABNORMAL LOW (ref 39.0–52.0)
Hemoglobin: 10.3 g/dL — ABNORMAL LOW (ref 13.0–17.0)
Immature Granulocytes: 1 %
Lymphocytes Relative: 13 %
Lymphs Abs: 1.5 10*3/uL (ref 0.7–4.0)
MCH: 31 pg (ref 26.0–34.0)
MCHC: 33.2 g/dL (ref 30.0–36.0)
MCV: 93.4 fL (ref 80.0–100.0)
Monocytes Absolute: 0.6 10*3/uL (ref 0.1–1.0)
Monocytes Relative: 5 %
Neutro Abs: 9.6 10*3/uL — ABNORMAL HIGH (ref 1.7–7.7)
Neutrophils Relative %: 80 %
Platelets: 210 10*3/uL (ref 150–400)
RBC: 3.32 MIL/uL — ABNORMAL LOW (ref 4.22–5.81)
RDW: 18.5 % — ABNORMAL HIGH (ref 11.5–15.5)
WBC: 11.9 10*3/uL — ABNORMAL HIGH (ref 4.0–10.5)
nRBC: 0.5 % — ABNORMAL HIGH (ref 0.0–0.2)

## 2018-08-19 LAB — GLUCOSE, CAPILLARY: Glucose-Capillary: 49 mg/dL — ABNORMAL LOW (ref 70–99)

## 2018-08-19 MED ORDER — INSULIN ASPART 100 UNIT/ML ~~LOC~~ SOLN: 0.0000 [IU] | Freq: Three times a day (TID) | SUBCUTANEOUS | Status: DC

## 2018-08-19 MED ORDER — SODIUM CHLORIDE 0.9 % IV SOLN
INTRAVENOUS | Status: DC
  Administered 2018-08-19: via INTRAVENOUS

## 2018-08-19 MED ORDER — PROSIGHT PO TABS
1.0000 | ORAL_TABLET | Freq: Two times a day (BID) | ORAL | Status: DC
  Administered 2018-08-19 – 2018-08-23 (×7): 1 via ORAL
  Filled 2018-08-19 (×7): qty 1

## 2018-08-19 MED ORDER — MORPHINE SULFATE (PF) 2 MG/ML IV SOLN
0.5000 mg | INTRAVENOUS | Status: DC | PRN
  Administered 2018-08-19 – 2018-08-21 (×2): 0.5 mg via INTRAVENOUS
  Filled 2018-08-19 (×2): qty 1

## 2018-08-19 MED ORDER — AMIODARONE HCL 200 MG PO TABS: 200.0000 mg | ORAL_TABLET | Freq: Every day | ORAL | Status: DC

## 2018-08-19 MED ORDER — FUROSEMIDE 20 MG PO TABS: 40.0000 mg | ORAL_TABLET | Freq: Two times a day (BID) | ORAL | Status: DC

## 2018-08-19 MED ORDER — POLYVINYL ALCOHOL 1.4 % OP SOLN
1.0000 [drp] | Freq: Every day | OPHTHALMIC | Status: DC
  Administered 2018-08-22 – 2018-08-23 (×2): 1 [drp] via OPHTHALMIC
  Filled 2018-08-19: qty 15

## 2018-08-19 MED ORDER — ISOSORBIDE MONONITRATE ER 30 MG PO TB24
30.0000 mg | ORAL_TABLET | Freq: Every day | ORAL | Status: DC
  Administered 2018-08-19 – 2018-08-23 (×3): 30 mg via ORAL
  Filled 2018-08-19 (×4): qty 1

## 2018-08-19 MED ORDER — METOPROLOL TARTRATE 25 MG PO TABS
25.0000 mg | ORAL_TABLET | Freq: Two times a day (BID) | ORAL | Status: DC
  Administered 2018-08-19 – 2018-08-22 (×6): 25 mg via ORAL
  Filled 2018-08-19 (×8): qty 1

## 2018-08-19 MED ORDER — LEVOTHYROXINE SODIUM 100 MCG PO TABS
100.0000 ug | ORAL_TABLET | Freq: Every day | ORAL | Status: DC
  Administered 2018-08-20 – 2018-08-23 (×4): 100 ug via ORAL
  Filled 2018-08-19 (×4): qty 1

## 2018-08-19 MED ORDER — ALLOPURINOL 300 MG PO TABS
300.0000 mg | ORAL_TABLET | Freq: Every day | ORAL | Status: DC
  Administered 2018-08-20: 10:00:00 300 mg via ORAL
  Filled 2018-08-19: qty 1

## 2018-08-19 MED ORDER — HYDROCODONE-ACETAMINOPHEN 5-325 MG PO TABS
1.0000 | ORAL_TABLET | Freq: Four times a day (QID) | ORAL | Status: DC | PRN
  Administered 2018-08-19 – 2018-08-20 (×3): 2 via ORAL
  Filled 2018-08-19 (×3): qty 2

## 2018-08-19 MED ORDER — FENTANYL CITRATE (PF) 100 MCG/2ML IJ SOLN
25.0000 ug | Freq: Once | INTRAMUSCULAR | Status: AC
  Administered 2018-08-19: 25 ug via INTRAVENOUS
  Filled 2018-08-19: qty 2

## 2018-08-19 NOTE — ED Notes (Addendum)
ED TO INPATIENT HANDOFF REPORT  ED Nurse Name and Phone #: Liz Malady Name/Age/Gender Elijah Saunders 85 y.o. male Room/Bed: 024C/024C  Code Status   Code Status: Full Code  Home/SNF/Other Home Patient oriented to: self, place, time and situation Is this baseline? Yes   Triage Complete: Triage complete  Chief Complaint fall  Triage Note Pt arrives via [facility] from home. Pt states he stumbled getting up and fell between two chairs on his concrete patio onto his right hip. Reported he felt nauseous and threw up a little bit after he fell. Does not report nausea at this time. Pt states pain is an 8 with movement. Pt is on a blood thinner Eliquis. Pt reports hitting the left side of his head on his fall.       Allergies Allergies  Allergen Reactions  . Ace Inhibitors Other (See Comments)    Renal insuffiencey  . Atorvastatin Other (See Comments)    Intolerant to many statins, muscle pain  . Crestor [Rosuvastatin Calcium] Other (See Comments)    Muscle pain  . Lopid [Gemfibrozil] Other (See Comments)    unknown  . Penicillins Other (See Comments)    Unknown Has patient had a PCN reaction causing immediate rash, facial/tongue/throat swelling, SOB or lightheadedness with hypotension: Yes Has patient had a PCN reaction causing severe rash involving mucus membranes or skin necrosis: no Has patient had a PCN reaction that required hospitalization: no Has patient had a PCN reaction occurring within the last 10 years: no  If all of the above answers are "NO", then may proceed with Cephalosporin use.   Marland Kitchen Zetia [Ezetimibe]     Leg cramps    Level of Care/Admitting Diagnosis ED Disposition    ED Disposition Condition Comment   Admit  Hospital Area: [facility] [[id number]]  Level of Care: Med-Surg [16]  Covid Evaluation: N/A  Diagnosis: Closed left hip fracture, initial encounter [facility]) [[id number]]  Admitting Physician: Wyvonnia Dusky  Attending  Physician: Hillary Bow [phone]  Estimated length of stay: past midnight tomorrow  Certification:: I certify this patient will need inpatient services for at least 2 midnights  PT Class (Do Not Modify): Inpatient [101]  PT Acc Code (Do Not Modify): Private [1]       B Medical/Surgery History Past Medical History:  Diagnosis Date  . AAA (abdominal aortic aneurysm) [facility]) 2003   s/p AAA R&G June 2003  . CAD (coronary artery disease) 2003   s/p CABG, 2003   . CHF (congestive heart failure) (HCC)    EF 35-40% by echo, May 2017  . Chronic renal insufficiency, stage III (moderate) (HCC)   . HTN (hypertension)   . Hypercholesteremia    statin intol  . MI (myocardial infarction) (HCC)   . SBO (small bowel obstruction) [facility]) Nov 2002   Past Surgical History:  Procedure Laterality Date  . ABDOMINAL AORTIC ANEURYSM REPAIR  June 2003  . CARDIOVERSION N/A 09/08/2015   Procedure: CARDIOVERSION;  Surgeon: Laurey Morale, MD;  Location: [facility] ENDOSCOPY;  Service: Cardiovascular;  Laterality: N/A;  . CARDIOVERSION N/A 03/20/2018   Procedure: CARDIOVERSION;  Surgeon: Vesta Mixer, MD;  Location: [facility] ENDOSCOPY;  Service: Cardiovascular;  Laterality: N/A;  . CORONARY ARTERY BYPASS GRAFT  March 2003   x 4  . HERNIA REPAIR    . NEPHRECTOMY Left    1955  . RIGHT/LEFT HEART CATH AND CORONARY/GRAFT ANGIOGRAPHY N/A 12/19/2016   Procedure: RIGHT/LEFT HEART CATH  AND CORONARY/GRAFT ANGIOGRAPHY;  Surgeon: [name], [name], MD;  [facility];  Service: Cardiovascular;  Laterality: N/A;  . TEE WITHOUT CARDIOVERSION N/A 09/08/2015   Procedure: TRANSESOPHAGEAL ECHOCARDIOGRAM (TEE);  Surgeon: [name], MD;  [facility];  Service: Cardiovascular;  Laterality: N/A;     A IV Location/Drains/Wounds Patient Lines/Drains/Airways Status   Active Line/Drains/Airways    Name:   Placement date:   Placement time:   Site:   Days:   Peripheral IV 08/19/18 Left Antecubital   08/19/18     1915    Antecubital   less than 1          Intake/Output Last 24 hours No intake or output data in the 24 hours ending 08/19/18 2147  Labs/Imaging Results for orders placed or performed during the hospital encounter of 08/19/18 (from the past 48 hour(s))  CBC with Differential     Status: Abnormal   Collection Time: 08/19/18  7:18 PM  Result Value Ref Range   WBC 11.9 (H) 4.0 - 10.5 K/uL   RBC 3.32 (L) 4.22 - 5.81 MIL/uL   Hemoglobin 10.3 (L) 13.0 - 17.0 g/dL   HCT 19.131.0 (L) 47.839.0 - 29.552.0 %   MCV 93.4 80.0 - 100.0 fL   MCH 31.0 26.0 - 34.0 pg   MCHC 33.2 30.0 - 36.0 g/dL   RDW 62.118.5 (H) 30.811.5 - 65.715.5 %   Platelets 210 150 - 400 K/uL   nRBC 0.5 (H) 0.0 - 0.2 %   Neutrophils Relative % 80 %   Neutro Abs 9.6 (H) 1.7 - 7.7 K/uL   Lymphocytes Relative 13 %   Lymphs Abs 1.5 0.7 - 4.0 K/uL   Monocytes Relative 5 %   Monocytes Absolute 0.6 0.1 - 1.0 K/uL   Eosinophils Relative 1 %   Eosinophils Absolute 0.1 0.0 - 0.5 K/uL   Basophils Relative 0 %   Basophils Absolute 0.0 0.0 - 0.1 K/uL   Immature Granulocytes 1 %   Abs Immature Granulocytes 0.12 (H) 0.00 - 0.07 K/uL    Comment: Performed at [facility], [address] [place], [place] [address]  Comprehensive metabolic panel     Status: Abnormal   Collection Time: 08/19/18  7:18 PM  Result Value Ref Range   Sodium 135 135 - 145 mmol/L   Potassium 3.6 3.5 - 5.1 mmol/L   Chloride 100 98 - 111 mmol/L   CO2 22 22 - 32 mmol/L   Glucose, Bld 56 (L) 70 - 99 mg/dL   BUN 295109 (H) 8 - 23 mg/dL   Creatinine, Ser 2.846.18 (H) 0.61 - 1.24 mg/dL   Calcium 9.2 8.9 - 13.210.3 mg/dL   Total Protein 6.9 6.5 - 8.1 g/dL   Albumin 3.3 (L) 3.5 - 5.0 g/dL   AST 37 15 - 41 U/L   ALT 44 0 - 44 U/L   Alkaline Phosphatase 74 38 - 126 U/L   Total Bilirubin 0.7 0.3 - 1.2 mg/dL   GFR calc non Af Amer 8 (L) >60 mL/min   GFR calc Af Amer 9 (L) >60 mL/min   Anion gap 13 5 - 15    Comment: Performed at [facility], [address] [place] [address]   Dg Chest 1 View  Result Date: 08/19/2018 CLINICAL DATA:  85 year old male with a history of hip fracture EXAM: CHEST  1 VIEW COMPARISON:  01/14/2018, 09/04/2015 FINDINGS: Cardiomediastinal silhouette unchanged. Surgical changes of median sternotomy  and CABG. Low lung volumes with reticular opacities throughout. No pneumothorax or pleural effusion. Mild fullness in the central vasculature with interlobular septal thickening. No confluent airspace disease. IMPRESSION: Low lung volumes with evidence of edema superimposed on chronic changes. Surgical changes of median sternotomy and CABG Electronically Signed   By: Gilmer Mor D.O.   On: 08/19/2018 21:04   Dg Pelvis 1-2 Views  Result Date: 08/19/2018 CLINICAL DATA:  Fall at home today. Pelvic right hip pain. Initial encounter. EXAM: PELVIS - 1-2 VIEW COMPARISON:  Right hip radiographs also obtained today FINDINGS: There is no evidence of pelvic fracture or diastasis. No pelvic bone lesions are seen. An intertrochanteric right hip fracture is seen. IMPRESSION: No evidence of pelvic fracture. Right hip fracture noted; see separate right hip x-ray report. Electronically Signed   By: Myles Rosenthal M.D.   On: 08/19/2018 20:54   Ct Head Wo Contrast  Result Date: 08/19/2018 CLINICAL DATA:  85 year old male status post fall on concrete patio. Vomiting. EXAM: CT HEAD WITHOUT CONTRAST CT CERVICAL SPINE WITHOUT CONTRAST TECHNIQUE: Multidetector CT imaging of the head and cervical spine was performed following the standard protocol without intravenous contrast. Multiplanar CT image reconstructions of the cervical spine were also generated. COMPARISON:  None. FINDINGS: CT HEAD FINDINGS Brain: Cerebral volume is within normal limits for age. No ventriculomegaly. No midline shift, mass effect, or evidence of intracranial mass lesion. No acute intracranial hemorrhage identified. No cortically based acute infarct identified. Patchy and confluent bilateral cerebral  white matter hypodensity. Bilateral basal ganglia lacunar infarcts, appearing more age indeterminate on the right (series 3, image 20). Small chronic appearing linear right cerebellar infarct on image 9. Vascular: Calcified atherosclerosis at the skull base. No suspicious intracranial vascular hyperdensity. Skull: Osteopenia.  No acute osseous abnormality identified. Sinuses/Orbits: Visualized paranasal sinuses and mastoids are well pneumatized. Other: Calcified scalp vessel atherosclerosis. No scalp hematoma. Negative orbits. CT CERVICAL SPINE FINDINGS Alignment: Preserved cervical lordosis. Cervicothoracic junction alignment is within normal limits. Bilateral posterior element alignment is within normal limits. Skull base and vertebrae: Osteopenia. Visualized skull base is intact. No atlanto-occipital dissociation. No acute osseous abnormality identified. Soft tissues and spinal canal: No prevertebral fluid or swelling. No visible canal hematoma. Bulky calcified carotid atherosclerosis in the neck. Disc levels: Left C7 cervical rib. Degenerative ligamentous hypertrophy about the odontoid. Disc degeneration with vacuum disc at C5-C6. Mild if any degenerative cervical spinal stenosis. Upper chest: Partially visible apically lung scarring. Grossly intact visible upper thoracic levels. IMPRESSION: 1. No acute traumatic injury identified in the head or cervical spine. 2. Small vessel ischemia of the brain with age indeterminate bilateral basal ganglia lacunar infarcts. 3. Calcified atherosclerosis. Electronically Signed   By: Odessa Fleming M.D.   On: 08/19/2018 20:14   Ct Cervical Spine Wo Contrast  Result Date: 08/19/2018 CLINICAL DATA:  85 year old male status post fall on concrete patio. Vomiting. EXAM: CT HEAD WITHOUT CONTRAST CT CERVICAL SPINE WITHOUT CONTRAST TECHNIQUE: Multidetector CT imaging of the head and cervical spine was performed following the standard protocol without intravenous contrast. Multiplanar CT  image reconstructions of the cervical spine were also generated. COMPARISON:  None. FINDINGS: CT HEAD FINDINGS Brain: Cerebral volume is within normal limits for age. No ventriculomegaly. No midline shift, mass effect, or evidence of intracranial mass lesion. No acute intracranial hemorrhage identified. No cortically based acute infarct identified. Patchy and confluent bilateral cerebral white matter hypodensity. Bilateral basal ganglia lacunar infarcts, appearing more age indeterminate on the right (series 3, image 20).  Small chronic appearing linear right cerebellar infarct on image 9. Vascular: Calcified atherosclerosis at the skull base. No suspicious intracranial vascular hyperdensity. Skull: Osteopenia.  No acute osseous abnormality identified. Sinuses/Orbits: Visualized paranasal sinuses and mastoids are well pneumatized. Other: Calcified scalp vessel atherosclerosis. No scalp hematoma. Negative orbits. CT CERVICAL SPINE FINDINGS Alignment: Preserved cervical lordosis. Cervicothoracic junction alignment is within normal limits. Bilateral posterior element alignment is within normal limits. Skull base and vertebrae: Osteopenia. Visualized skull base is intact. No atlanto-occipital dissociation. No acute osseous abnormality identified. Soft tissues and spinal canal: No prevertebral fluid or swelling. No visible canal hematoma. Bulky calcified carotid atherosclerosis in the neck. Disc levels: Left C7 cervical rib. Degenerative ligamentous hypertrophy about the odontoid. Disc degeneration with vacuum disc at C5-C6. Mild if any degenerative cervical spinal stenosis. Upper chest: Partially visible apically lung scarring. Grossly intact visible upper thoracic levels. IMPRESSION: 1. No acute traumatic injury identified in the head or cervical spine. 2. Small vessel ischemia of the brain with age indeterminate bilateral basal ganglia lacunar infarcts. 3. Calcified atherosclerosis. Electronically Signed   By: Odessa Fleming  M.D.   On: 08/19/2018 20:14   Dg Knee Complete 4 Views Right  Result Date: 08/19/2018 CLINICAL DATA:  Fall at home today. Right knee pain. Initial encounter. EXAM: RIGHT KNEE - COMPLETE 4+ VIEW COMPARISON:  None. FINDINGS: No evidence of fracture, dislocation, or joint effusion. Moderate medial compartment osteoarthritis is seen. 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 IMPRESSION: 1. No acute findings. 2. Moderate medial compartment osteoarthritis. Electronically Signed   By: Myles Rosenthal M.D.   On: 08/19/2018 20:56   Dg Femur Min 2 Views Right  Result Date: 08/19/2018 CLINICAL DATA:  Fall at home today. Right hip and thigh pain. Initial encounter. EXAM: RIGHT FEMUR 2 VIEWS COMPARISON:  None. FINDINGS: A nondisplaced intertrochanteric right hip fracture is seen. No evidence of distal femur fracture. Extensive peripheral vascular calcification noted. IMPRESSION: Nondisplaced intertrochanteric right hip fracture. Electronically Signed   By: Myles Rosenthal M.D.   On: 08/19/2018 20:55    Pending Labs Unresulted Labs (From admission, onward)   None      Vitals/Pain Today's Vitals   08/19/18 1915 08/19/18 2115 08/19/18 2142 08/19/18 2142  BP: 101/64 117/66    Pulse:  71    Resp: 20 15    SpO2:  95%    Weight:      Height:      PainSc:   8  8     Isolation Precautions No active isolations  Medications Medications  furosemide (LASIX) tablet 40 mg (has no administration in time range)  isosorbide mononitrate (IMDUR) 24 hr tablet 30 mg (has no administration in time range)  metoprolol tartrate (LOPRESSOR) tablet 25 mg (has no administration in time range)  PreserVision AREDS 2 CAPS 1 capsule (has no administration in time range)  levothyroxine (SYNTHROID) tablet 100 mcg (has no administration in time range)  Polyethyl Glycol-Propyl Glycol 0.4-0.3 % SOLN 1 drop (has no administration in time range)   allopurinol (ZYLOPRIM) tablet 300 mg (has no administration in time range)  amiodarone (PACERONE) tablet 200 mg (has no administration in time range)  insulin aspart (novoLOG) injection 0-15 Units (has no administration in time range)  HYDROcodone-acetaminophen (NORCO/VICODIN) 5-325 MG per tablet 1-2 tablet (has no administration in time range)  morphine 2 MG/ML injection 0.5 mg (has no administration in time range)  fentaNYL (SUBLIMAZE) injection 25 mcg (25 mcg Intravenous Given  08/19/18 2046)    Mobility walks Low fall risk   Focused Assessments pain with palpation to right hip. pain with movement. no shortening or rotation noted to right leg   R Recommendations: See Admitting Provider Note  Report given to:   Additional Notes:

## 2018-08-19 NOTE — H&P (Signed)
History and Physical    Elijah Saunders RUE:[id number] DOB: 07-12-1933 DOA: 08/19/2018  PCP: Aida Puffer, MD  Patient coming from: Home  I have personally briefly reviewed patient's old medical records in [facility]  Chief Complaint: Hip pain, fall  HPI: Elijah Saunders is a 85 y.o. male with medical history significant of CHF dt ICM with EF 20-25%, A.Fib, DM2, CKD stage 3.  Patient had mechanical fall while trying to get out of chair today.  Fell on R hip.  Severe pain with movement, unable to bear weight.  Ambulance called and pt brought to ED.   ED Course: R hip intertroch fx.  Also found to have AKF with creat of 6 and BUN just over 100.  Looks like he was 1.3 and 27 respectively back in Nov 2019.  Patient has been in his normal state of health over the last few months has had a decreased appetite and some weight loss.  EDP spoke to the wife over the phone who states that she was able to witness the fall and the patient has been at his normal mental baseline today.  No recent fevers or URI symptoms.  No changes in bowel or bladder habits that she is aware of.   Review of Systems: As per HPI otherwise 10 point review of systems negative.   Past Medical History:  Diagnosis Date   AAA (abdominal aortic aneurysm) (HCC) 2003   s/p AAA R&G June 2003   CAD (coronary artery disease) 2003   s/p CABG, 2003    CHF (congestive heart failure) (HCC)    EF 35-40% by echo, May 2017   Chronic renal insufficiency, stage III (moderate) (HCC)    HTN (hypertension)    Hypercholesteremia    statin intol   MI (myocardial infarction) (HCC)    SBO (small bowel obstruction) [facility]) Nov 2002    Past Surgical History:  Procedure Laterality Date   ABDOMINAL AORTIC ANEURYSM REPAIR  June 2003   CARDIOVERSION N/A 09/08/2015   Procedure: CARDIOVERSION;  Surgeon: Laurey Morale, MD;  Location: [facility] ENDOSCOPY;  Service: Cardiovascular;  Laterality: N/A;   CARDIOVERSION N/A 03/20/2018   Procedure: CARDIOVERSION;  Surgeon: Vesta Mixer, MD;  Location: [facility] ENDOSCOPY;  Service: Cardiovascular;  Laterality: N/A;   CORONARY ARTERY BYPASS GRAFT  March 2003   x 4   HERNIA REPAIR     NEPHRECTOMY Left    1955   RIGHT/LEFT HEART CATH AND CORONARY/GRAFT ANGIOGRAPHY N/A 12/19/2016   Procedure: RIGHT/LEFT HEART CATH AND CORONARY/GRAFT ANGIOGRAPHY;  Surgeon: Lyn Records, MD;  Location: [facility] INVASIVE CV LAB;  Service: Cardiovascular;  Laterality: N/A;   TEE WITHOUT CARDIOVERSION N/A 09/08/2015   Procedure: TRANSESOPHAGEAL ECHOCARDIOGRAM (TEE);  Surgeon: Laurey Morale, MD;  Location: [facility] ENDOSCOPY;  Service: Cardiovascular;  Laterality: N/A;     reports that he quit smoking about 23 years ago. His smoking use included cigarettes. He has a 40.00 pack-year smoking history. He has never used smokeless tobacco. He reports that he does not drink alcohol or use drugs.  Allergies  Allergen Reactions   Ace Inhibitors Other (See Comments)    Renal insuffiencey   Atorvastatin Other (See Comments)    Intolerant to many statins, muscle pain   Crestor [Rosuvastatin Calcium] Other (See Comments)    Muscle pain   Lopid [Gemfibrozil] Other (See Comments)    Unknown reaction   Penicillins Other (See Comments)    Unknown Has patient had a PCN reaction causing immediate  rash, facial/tongue/throat swelling, SOB or lightheadedness with hypotension: unknown Has patient had a PCN reaction causing severe rash involving mucus membranes or skin necrosis: unknown Has patient had a PCN reaction that required hospitalization: unknown Has patient had a PCN reaction occurring within the last 10 years: no  If all of the above answers are "NO", then may proceed with Cephalosporin use.    Zetia [Ezetimibe]     Leg cramps    Family History  Problem Relation Age of Onset   Alzheimer's disease Mother    Diabetes Mother    Gallbladder disease Mother    Cancer - Cervical Mother    Coronary  artery disease Father    Diabetes Father      Prior to Admission medications   Medication Sig Start Date End Date Taking? Authorizing Provider  acetaminophen (TYLENOL) 325 MG tablet Take 650 mg by mouth every 6 (six) hours as needed for headache (pain).   Yes [provider]  allopurinol (ZYLOPRIM) 300 MG tablet Take 300 mg by mouth daily.   Yes [provider]  amiodarone (PACERONE) 200 MG tablet Take 1 tablet (200 mg total) by mouth daily. 03/20/18  Yes Nahser, Deloris Ping, MD  cyanocobalamin 500 MCG tablet Take 500 mcg by mouth at bedtime. Vitamin b12   Yes [provider]  ELIQUIS 2.5 MG TABS tablet TAKE 1 TABLET BY MOUTH TWICE DAILY Patient taking differently: Take 2.5 mg by mouth 2 (two) times daily.  04/29/18  Yes Nahser, Deloris Ping, MD  furosemide (LASIX) 40 MG tablet Take 1 tablet (40 mg total) by mouth 2 (two) times daily. Patient taking differently: Take 40 mg by mouth 2 (two) times daily with breakfast and lunch.  03/13/18  Yes Nahser, Deloris Ping, MD  glimepiride (AMARYL) 1 MG tablet Take 1 mg by mouth daily with lunch.  07/08/12  Yes [provider]  isosorbide mononitrate (IMDUR) 30 MG 24 hr tablet TAKE 1 TABLET BY MOUTH DAILY Patient taking differently: Take 30 mg by mouth daily with lunch.  06/24/18  Yes Nahser, Deloris Ping, MD  levothyroxine (SYNTHROID, LEVOTHROID) 100 MCG tablet Take 100 mcg by mouth daily before breakfast. 08/23/15  Yes [provider]  Menthol, Topical Analgesic, (BLUE-EMU MAXIMUM STRENGTH EX) Apply 1 application topically 2 (two) times a day. For knee pain/ sore muscles   Yes [provider]  metFORMIN (GLUCOPHAGE) 500 MG tablet Take 500 mg by mouth 2 (two) times daily.    Yes [provider]  metoprolol tartrate (LOPRESSOR) 25 MG tablet Take 1 tablet (25 mg total) by mouth 2 (two) times daily. 03/04/18  Yes Nahser, Deloris Ping, MD  Multiple Vitamins-Minerals (PRESERVISION AREDS 2) CAPS Take 1 capsule by mouth 2  (two) times daily.   Yes [provider]  Polyethyl Glycol-Propyl Glycol (SYSTANE ULTRA) 0.4-0.3 % SOLN Place 1 drop into both eyes daily.   Yes [provider]  Vitamin D, Ergocalciferol, (DRISDOL) 50000 units CAPS capsule Take 50,000 Units by mouth every Wednesday.   Yes [provider]  ACCU-CHEK SMARTVIEW test strip Use as directed to test blood glucose each morning. 08/23/15   [provider]    Physical Exam: Vitals:   08/19/18 1904 08/19/18 1915 08/19/18 2115  BP:  101/64 117/66  Pulse:   71  Resp:  20 15  SpO2:   95%  Weight: 60.3 kg    Height:  (1.727 m)      Constitutional: NAD, calm, comfortable Eyes: PERRL, lids and  conjunctivae normal ENMT: Mucous membranes are moist. Posterior pharynx clear of any exudate or lesions.Normal dentition.  Neck: normal, supple, no masses, no thyromegaly Respiratory: clear to auscultation bilaterally, no wheezing, no crackles. Normal respiratory effort. No accessory muscle use.  Cardiovascular: Regular rate and rhythm, no murmurs / rubs / gallops. No extremity edema. 2+ pedal pulses. No carotid bruits.  Abdomen: no tenderness, no masses palpated. No hepatosplenomegaly. Bowel sounds positive.  Musculoskeletal: RLE shortened and rotated. Skin: no rashes, lesions, ulcers. No induration Neurologic: CN 2-12 grossly intact. Sensation intact, DTR normal. Strength 5/5 in all 4.  Psychiatric: Normal judgment and insight. Alert and oriented x 3. Normal mood.    Labs on Admission: I have personally reviewed following labs and imaging studies  CBC: Recent Labs  Lab 08/19/18 1918  WBC 11.9*  NEUTROABS 9.6*  HGB 10.3*  HCT 31.0*  MCV 93.4  PLT 210   Basic Metabolic Panel: Recent Labs  Lab 08/19/18 1918  NA 135  K 3.6  CL 100  CO2 22  GLUCOSE 56*  BUN 109*  CREATININE 6.18*  CALCIUM 9.2   GFR: Estimated Creatinine Clearance: 7.9 mL/min (A) (by C-G formula based on SCr of 6.18 mg/dL (H)). Liver  Function Tests: Recent Labs  Lab 08/19/18 1918  AST 37  ALT 44  ALKPHOS 74  BILITOT 0.7  PROT 6.9  ALBUMIN 3.3*   No results for input(s): LIPASE, AMYLASE in the last 168 hours. No results for input(s): AMMONIA in the last 168 hours. Coagulation Profile: No results for input(s): INR, PROTIME in the last 168 hours. Cardiac Enzymes: No results for input(s): CKTOTAL, CKMB, CKMBINDEX, TROPONINI in the last 168 hours. BNP (last 3 results) No results for input(s): PROBNP in the last 8760 hours. HbA1C: No results for input(s): HGBA1C in the last 72 hours. CBG: No results for input(s): GLUCAP in the last 168 hours. Lipid Profile: No results for input(s): CHOL, HDL, LDLCALC, TRIG, CHOLHDL, LDLDIRECT in the last 72 hours. Thyroid Function Tests: No results for input(s): TSH, T4TOTAL, FREET4, T3FREE, THYROIDAB in the last 72 hours. Anemia Panel: No results for input(s): VITAMINB12, FOLATE, FERRITIN, TIBC, IRON, RETICCTPCT in the last 72 hours. Urine analysis:    Component Value Date/Time   COLORURINE YELLOW 09/04/2015 2350   APPEARANCEUR CLEAR 09/04/2015 2350   LABSPEC 1.012 09/04/2015 2350   PHURINE 5.0 09/04/2015 2350   GLUCOSEU NEGATIVE 09/04/2015 2350   HGBUR NEGATIVE 09/04/2015 2350   BILIRUBINUR NEGATIVE 09/04/2015 2350   KETONESUR NEGATIVE 09/04/2015 2350   PROTEINUR NEGATIVE 09/04/2015 2350   NITRITE NEGATIVE 09/04/2015 2350   LEUKOCYTESUR NEGATIVE 09/04/2015 2350    Radiological Exams on Admission: Dg Chest 1 View  Result Date: 08/19/2018 CLINICAL DATA:  85 year old male with a history of hip fracture EXAM: CHEST  1 VIEW COMPARISON:  01/14/2018, 09/04/2015 FINDINGS: Cardiomediastinal silhouette unchanged. Surgical changes of median sternotomy and CABG. Low lung volumes with reticular opacities throughout. No pneumothorax or pleural effusion. Mild fullness in the central vasculature with interlobular septal thickening. No confluent airspace disease. IMPRESSION: Low lung  volumes with evidence of edema superimposed on chronic changes. Surgical changes of median sternotomy and CABG Electronically Signed   By: Gilmer Mor D.O.   On: 08/19/2018 21:04   Dg Pelvis 1-2 Views  Result Date: 08/19/2018 CLINICAL DATA:  Fall at home today. Pelvic right hip pain. Initial encounter. EXAM: PELVIS - 1-2 VIEW COMPARISON:  Right hip radiographs also obtained today FINDINGS: There is no evidence of pelvic fracture or diastasis.  No pelvic bone lesions are seen. An intertrochanteric right hip fracture is seen. IMPRESSION: No evidence of pelvic fracture. Right hip fracture noted; see separate right hip x-ray report. Electronically Signed   By: Myles Rosenthal M.D.   On: 08/19/2018 20:54   Ct Head Wo Contrast  Result Date: 08/19/2018 CLINICAL DATA:  85 year old male status post fall on concrete patio. Vomiting. EXAM: CT HEAD WITHOUT CONTRAST CT CERVICAL SPINE WITHOUT CONTRAST TECHNIQUE: Multidetector CT imaging of the head and cervical spine was performed following the standard protocol without intravenous contrast. Multiplanar CT image reconstructions of the cervical spine were also generated. COMPARISON:  None. FINDINGS: CT HEAD FINDINGS Brain: Cerebral volume is within normal limits for age. No ventriculomegaly. No midline shift, mass effect, or evidence of intracranial mass lesion. No acute intracranial hemorrhage identified. No cortically based acute infarct identified. Patchy and confluent bilateral cerebral white matter hypodensity. Bilateral basal ganglia lacunar infarcts, appearing more age indeterminate on the right (series 3, image 20). Small chronic appearing linear right cerebellar infarct on image 9. Vascular: Calcified atherosclerosis at the skull base. No suspicious intracranial vascular hyperdensity. Skull: Osteopenia.  No acute osseous abnormality identified. Sinuses/Orbits: Visualized paranasal sinuses and mastoids are well pneumatized. Other: Calcified scalp vessel  atherosclerosis. No scalp hematoma. Negative orbits. CT CERVICAL SPINE FINDINGS Alignment: Preserved cervical lordosis. Cervicothoracic junction alignment is within normal limits. Bilateral posterior element alignment is within normal limits. Skull base and vertebrae: Osteopenia. Visualized skull base is intact. No atlanto-occipital dissociation. No acute osseous abnormality identified. Soft tissues and spinal canal: No prevertebral fluid or swelling. No visible canal hematoma. Bulky calcified carotid atherosclerosis in the neck. Disc levels: Left C7 cervical rib. Degenerative ligamentous hypertrophy about the odontoid. Disc degeneration with vacuum disc at C5-C6. Mild if any degenerative cervical spinal stenosis. Upper chest: Partially visible apically lung scarring. Grossly intact visible upper thoracic levels. IMPRESSION: 1. No acute traumatic injury identified in the head or cervical spine. 2. Small vessel ischemia of the brain with age indeterminate bilateral basal ganglia lacunar infarcts. 3. Calcified atherosclerosis. Electronically Signed   By: Odessa Fleming M.D.   On: 08/19/2018 20:14   Ct Cervical Spine Wo Contrast  Result Date: 08/19/2018 CLINICAL DATA:  85 year old male status post fall on concrete patio. Vomiting. EXAM: CT HEAD WITHOUT CONTRAST CT CERVICAL SPINE WITHOUT CONTRAST TECHNIQUE: Multidetector CT imaging of the head and cervical spine was performed following the standard protocol without intravenous contrast. Multiplanar CT image reconstructions of the cervical spine were also generated. COMPARISON:  None. FINDINGS: CT HEAD FINDINGS Brain: Cerebral volume is within normal limits for age. No ventriculomegaly. No midline shift, mass effect, or evidence of intracranial mass lesion. No acute intracranial hemorrhage identified. No cortically based acute infarct identified. Patchy and confluent bilateral cerebral white matter hypodensity. Bilateral basal ganglia lacunar infarcts, appearing more age  indeterminate on the right (series 3, image 20). Small chronic appearing linear right cerebellar infarct on image 9. Vascular: Calcified atherosclerosis at the skull base. No suspicious intracranial vascular hyperdensity. Skull: Osteopenia.  No acute osseous abnormality identified. Sinuses/Orbits: Visualized paranasal sinuses and mastoids are well pneumatized. Other: Calcified scalp vessel atherosclerosis. No scalp hematoma. Negative orbits. CT CERVICAL SPINE FINDINGS Alignment: Preserved cervical lordosis. Cervicothoracic junction alignment is within normal limits. Bilateral posterior element alignment is within normal limits. Skull base and vertebrae: Osteopenia. Visualized skull base is intact. No atlanto-occipital dissociation. No acute osseous abnormality identified. Soft tissues and spinal canal: No prevertebral fluid or swelling. No visible canal hematoma. Bulky calcified carotid  atherosclerosis in the neck. Disc levels: Left C7 cervical rib. Degenerative ligamentous hypertrophy about the odontoid. Disc degeneration with vacuum disc at C5-C6. Mild if any degenerative cervical spinal stenosis. Upper chest: Partially visible apically lung scarring. Grossly intact visible upper thoracic levels. IMPRESSION: 1. No acute traumatic injury identified in the head or cervical spine. 2. Small vessel ischemia of the brain with age indeterminate bilateral basal ganglia lacunar infarcts. 3. Calcified atherosclerosis. Electronically Signed   By: Odessa Fleming M.D.   On: 08/19/2018 20:14   Dg Knee Complete 4 Views Right  Result Date: 08/19/2018 CLINICAL DATA:  Fall at home today. Right knee pain. Initial encounter. EXAM: RIGHT KNEE - COMPLETE 4+ VIEW COMPARISON:  None. FINDINGS: No evidence of fracture, dislocation, or joint effusion. Moderate medial compartment osteoarthritis is seen. 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  IMPRESSION: 1. No acute findings. 2. Moderate medial compartment osteoarthritis. Electronically Signed   By: Myles Rosenthal M.D.   On: 08/19/2018 20:56   Dg Femur Min 2 Views Right  Result Date: 08/19/2018 CLINICAL DATA:  Fall at home today. Right hip and thigh pain. Initial encounter. EXAM: RIGHT FEMUR 2 VIEWS COMPARISON:  None. FINDINGS: A nondisplaced intertrochanteric right hip fracture is seen. No evidence of distal femur fracture. Extensive peripheral vascular calcification noted. IMPRESSION: Nondisplaced intertrochanteric right hip fracture. Electronically Signed   By: Myles Rosenthal M.D.   On: 08/19/2018 20:55    EKG: Independently reviewed.  Assessment/Plan Principal Problem:   Closed displaced intertrochanteric fracture of right femur (HCC) Active Problems:   Chronic systolic CHF (congestive heart failure) (HCC)   Chronic renal disease, stage III (HCC)   Type 2 diabetes mellitus with renal manifestations, controlled (HCC)   Cardiomyopathy, ischemic-EF 35-40%   Persistent atrial fibrillation   Acute kidney failure (HCC)   Closed left hip fracture, initial encounter (HCC)    1. Closed displaced intertrochanteric fx - 1. Hip fx pathway 2. Surgery delayed till wed due to being on eliquis 3. Pain ctrl per pathway 2. AKF on CKD stage 3 - 1. Holding home lasix 2. Gentle hydration with NS at 75 cc/hr 3. Repeat BMP in AM 4. UA 5. Urine lytes 6. CT renal stone protocol to r/o obstruction 7. Consult nephro in AM unless he has rapid improvement 3. DM2 - 1. Hold metformin and PO hypoglycemics 2. Sensitive SSI AC 4. Chronic systolic CHF - 1. Hold lasix 2. Cont Imdur, metoprolol 3. Watch for fluid overload with rehydration. 5. A.Fib - 1. Hold eliquis 2. Cont metoprolol 3. Will hold amiodarone just in case, but not usually a cause of AKF.  Also not likely to make difference in A.Fib during hospital stay given the exceptionally long half life of amiodarone.  DVT prophylaxis:  SCDs Code Status: Full Family Communication: No family in room Disposition Plan: CIR vs SNF after admit Consults called: Ortho, see Dr. Roda Shutters note Admission status: Admit to inpatient  Severity of Illness: The appropriate patient status for this patient is INPATIENT. Inpatient status is judged to be reasonable and necessary in order to provide the required intensity of service to ensure the patient's safety. The patient's presenting symptoms, physical exam findings, and initial radiographic and laboratory data in the context of their chronic comorbidities is felt to place them at high risk for further clinical deterioration. Furthermore, it is not anticipated that the patient will be medically stable for discharge from the hospital within 2 midnights of admission. The  following factors support the patient status of inpatient.   IP status for surgical repair of hip fx.  Also has severe AKF.   * I certify that at the point of admission it is my clinical judgment that the patient will require inpatient hospital care spanning beyond 2 midnights from the point of admission due to high intensity of service, high risk for further deterioration and high frequency of surveillance required.*    Amory Zbikowski M. DO [facility]  How to contact the [facility] Attending or Consulting provider 7A - 7P or covering provider during after hours 7P -7A, for this patient?  1. Check the care team in [facility] and look for a) attending/consulting [facility] provider listed and b) the [facility] team listed 2. Log into [email or web address]  Amion Physician Scheduling and messaging for groups and whole hospitals  On call and physician scheduling software for group practices, residents, hospitalists and other medical providers for call, clinic, rotation and shift schedules. On[employer] is a hospital-wide system for scheduling doctors and paging doctors on call. EasyPlot is for scientific plotting and data analysis.  [email or web address]  and use  [place]'s universal password to access. If you do not have the password, please contact the hospital operator.  3. Locate the [facility] provider you are looking for under [facility] and page to a number that you can be directly reached. 4. If you still have difficulty reaching the provider, please page the [facility] (Director on Call) for the Hospitalists listed on amion for assistance.  08/19/2018, 10:11 PM

## 2018-08-19 NOTE — Progress Notes (Signed)
I am aware of patient and will assume orthopedic care. Patient has extensive cardiac history and will require preop cardiac clearance as well as medical clearance prior to surgery.  I will see the patient in the morning for a full consult.  Patient is on eliquis so will need to delay surgery until Wednesday.  Hold eliquis for now and will resume after surgery.  Please call me directly with any questions.  Thank you.  Mayra Reel, MD [facility] [phone] 8:47 PM

## 2018-08-19 NOTE — ED Provider Notes (Signed)
[facility] Note   CSN: [id number] Arrival date & time: 08/19/18  1854    History   Chief Complaint Chief Complaint  Patient presents with  . Fall    HPI Ericka PontiffHoward L Florence is a 85 y.o. male.     HPI 85 year old man with a past medical history of AAA repair, coronary artery disease status post CABG presents to the emergency department after mechanical fall where the patient was trying to get out of his chair today and tripped and fell striking the left side of his head on a chair and falling onto his right hip.  Patient unable to bear weight on the hip after he fell.  Ambulance was called and they brought the patient here for evaluation.  Patient has been in his normal state of health over the last few months has had a decreased appetite and some weight loss.  I spoke to the wife over the phone who states that she was able to witness the fall and the patient has been at his normal mental baseline today.  No recent fevers or URI symptoms.  No changes in bowel or bladder habits that she is aware of.  Patient complaining of pain in his right hip but nowhere else at this time. Past Medical History:  Diagnosis Date  . AAA (abdominal aortic aneurysm) [facility](HCC) 2003   s/p AAA R&G June 2003  . CAD (coronary artery disease) 2003   s/p CABG, 2003   . CHF (congestive heart failure) (HCC)    EF 35-40% by echo, May 2017  . Chronic renal insufficiency, stage III (moderate) (HCC)   . HTN (hypertension)   . Hypercholesteremia    statin intol  . MI (myocardial infarction) (HCC)   . SBO (small bowel obstruction) [facility](HCC) Nov 2002    Patient Active Problem List   Diagnosis Date Noted  . Near syncope 12/14/2016  . Orthostatic hypotension 12/30/2015  . Dyspnea   . Chronic renal disease, stage III (HCC) 09/06/2015  . Type 2 diabetes mellitus with renal manifestations, controlled (HCC) 09/06/2015  . Cardiomyopathy, ischemic-EF 35-40% 09/06/2015  . ACE  inhibitor intolerance 09/06/2015  . Persistent atrial fibrillation 09/06/2015  . Emphysema, interstitial (HCC) 09/06/2015  . S/P AAA repair 2003 02/13/2011  . Chronic systolic CHF (congestive heart failure) (HCC) 02/13/2011  . Hx of CABG x 4 2003 08/16/2010  . Hyperlipidemia-statin intol 08/16/2010    Past Surgical History:  Procedure Laterality Date  . ABDOMINAL AORTIC ANEURYSM REPAIR  June 2003  . CARDIOVERSION N/A 09/08/2015   Procedure: CARDIOVERSION;  Surgeon: [name], MD;  [facility];  Service: Cardiovascular;  Laterality: N/A;  . CARDIOVERSION N/A 03/20/2018   Procedure: CARDIOVERSION;  Surgeon: Vesta MixerNahser, Philip J, MD;  [facility];  Service: Cardiovascular;  Laterality: N/A;  . CORONARY ARTERY BYPASS GRAFT  March 2003   x 4  . HERNIA REPAIR    . NEPHRECTOMY Left    1955  . RIGHT/LEFT HEART CATH AND CORONARY/GRAFT ANGIOGRAPHY N/A 12/19/2016   Procedure: RIGHT/LEFT HEART CATH AND CORONARY/GRAFT ANGIOGRAPHY;  Surgeon: [name], [name], MD;  Location: [facility] INVASIVE CV LAB;  Service: Cardiovascular;  Laterality: N/A;  . TEE WITHOUT CARDIOVERSION N/A 09/08/2015   Procedure: TRANSESOPHAGEAL ECHOCARDIOGRAM (TEE);  Surgeon: [name], MD;  [facility];  Service: Cardiovascular;  Laterality: N/A;        Home Medications    Prior to Admission medications   Medication Sig Start Date End Date  Taking? Authorizing Provider  ACCU-CHEK SMARTVIEW test strip Use as directed to test blood glucose each morning. 08/23/15   [provider]  allopurinol (ZYLOPRIM) 300 MG tablet Take 300 mg by mouth daily.    [provider]  amiodarone (PACERONE) 200 MG tablet Take 1 tablet (200 mg total) by mouth daily. 03/20/18   Nahser, Deloris Ping, MD  cyanocobalamin 500 MCG tablet Take 500 mcg by mouth daily.     [provider]  ELIQUIS 2.5 MG TABS tablet TAKE 1 TABLET BY MOUTH TWICE DAILY 04/29/18   Nahser, Deloris Ping, MD  furosemide (LASIX) 40 MG  tablet Take 1 tablet (40 mg total) by mouth 2 (two) times daily. 03/13/18   Nahser, Deloris Ping, MD  glimepiride (AMARYL) 1 MG tablet Take 1 mg by mouth daily with lunch.  07/08/12   [provider]  isosorbide mononitrate (IMDUR) 30 MG 24 hr tablet TAKE 1 TABLET BY MOUTH DAILY 06/24/18   Nahser, Deloris Ping, MD  levothyroxine (SYNTHROID, LEVOTHROID) 100 MCG tablet Take 100 mcg by mouth daily before breakfast. 08/23/15   [provider]  Menthol, Topical Analgesic, (BLUE-EMU MAXIMUM STRENGTH EX) Apply 1 application topically 3 (three) times daily as needed (for knee pain.). For sore muscles.     [provider]  metFORMIN (GLUCOPHAGE) 500 MG tablet Take 500 mg by mouth 2 (two) times daily.     [provider]  metoprolol tartrate (LOPRESSOR) 25 MG tablet Take 1 tablet (25 mg total) by mouth 2 (two) times daily. 03/04/18   Nahser, Deloris Ping, MD  Multiple Vitamins-Minerals (PRESERVISION AREDS 2) CAPS Take 1 capsule by mouth 2 (two) times daily.    [provider]  Polyethyl Glycol-Propyl Glycol (SYSTANE ULTRA) 0.4-0.3 % SOLN Place 1 drop into both eyes daily.    [provider]  Vitamin D, Ergocalciferol, (DRISDOL) 50000 units CAPS capsule Take 50,000 Units by mouth every Wednesday.    [provider]    Family History Family History  Problem Relation Age of Onset  . Alzheimer's disease Mother   . Diabetes Mother   . Gallbladder disease Mother   . Cancer - Cervical Mother   . Coronary artery disease Father   . Diabetes Father     Social History Social History   Tobacco Use  . Smoking status: Former Smoker    Packs/day: 1.00    Years: 40.00    Pack years: 40.00    Types: Cigarettes    Last attempt to quit: 04/25/1995    Years since quitting: 23.3  . Smokeless tobacco: Never Used  Substance Use Topics  . Alcohol use: No    Alcohol/week: 0.0 standard drinks  . Drug use: No     Allergies   Ace inhibitors; Atorvastatin; Crestor  [rosuvastatin calcium]; Lopid [gemfibrozil]; Penicillins; and Zetia [ezetimibe]   Review of Systems Review of Systems  Constitutional: Negative for chills and fever.  HENT: Negative for ear pain and sore throat.   Eyes: Negative for pain and visual disturbance.  Respiratory: Negative for cough and shortness of breath.   Cardiovascular: Negative for chest pain and palpitations.  Gastrointestinal: Negative for abdominal pain and vomiting.  Genitourinary: Negative for dysuria and hematuria.  Musculoskeletal: Positive for arthralgias (Right hip). Negative for back pain.  Skin: Negative for color change and rash.  Allergic/Immunologic: Negative for immunocompromised state.  Neurological: Negative for seizures, syncope, weakness and numbness.  Hematological: Bruises/bleeds easily (on eliquis).  All other systems reviewed and are negative.  Physical Exam Updated Vital Signs There were no vitals taken for this visit.  Physical Exam Vitals signs and nursing note reviewed.  Constitutional:      Appearance: He is well-developed. He is not ill-appearing or diaphoretic.  HENT:     Head: Normocephalic and atraumatic.  Eyes:     Conjunctiva/sclera: Conjunctivae normal.  Neck:     Musculoskeletal: Neck supple.  Cardiovascular:     Rate and Rhythm: Normal rate. Rhythm irregular.     Heart sounds: No murmur. No friction rub.     Comments: Midline sternotomy scar. Pulmonary:     Effort: Pulmonary effort is normal. No respiratory distress.     Breath sounds: Normal breath sounds.  Abdominal:     Palpations: Abdomen is soft.     Tenderness: There is no abdominal tenderness.  Musculoskeletal:        General: Tenderness present. No deformity.     Right lower leg: No edema.     Left lower leg: No edema.     Comments: Tenderness over the area of the greater trochanter of the right hip.  5 out of 5 strength with full range of motion of the hip and the left leg.  Unable to raise right leg off  the bed due to pain but does appear to have good muscle activation in the hamstrings as well as the anterior thigh.  Intact dorsalis pedis and posterior tibial pulses in the feet.  Able to wiggle toes.  5-5 strength in bilateral upper extremities without any pain or limited range of motion.  Skin:    General: Skin is warm and dry.     Findings: No lesion.  Neurological:     General: No focal deficit present.     Mental Status: He is alert and oriented to person, place, and time.     Sensory: No sensory deficit.     Motor: No weakness.      ED Treatments / Results  Labs (all labs ordered are listed, but only abnormal results are displayed) Labs Reviewed - No data to display  EKG None  Radiology No results found.  Procedures Procedures (including critical care time)  Medications Ordered in ED Medications - No data to display   Initial Impression / Assessment and Plan / ED Course  I have reviewed the triage vital signs and the nursing notes.  Pertinent labs & imaging results that were available during my care of the patient were reviewed by me and considered in my medical decision making (see chart for details).        Well-appearing hemodynamically stable 85 year old man presents to the emergency department for evaluation of a fall with possible right hip fracture.  Patient appears to be in atrial fibrillation with EKG similar to baseline.  Not complaining of any chest pain or shortness of breath.  Has had weight loss over the last several months but no other complaints.  CT scan of the head and neck does not show any acute pathology.  X-ray of the leg shows intertrochanteric fracture.  Updated the family via phone conversation.  Patient will be staying in the hospital for possible hip surgery in the next several days.  Orthopedics consulted.  Otherwise hemodynamically stable under my care.  All the patient's chest x-ray does look to have some volume overload, the patient does  not have any symptoms of shortness of breath at this time.  No acute events under my care. Final Clinical Impressions(s) / ED Diagnoses  Final diagnoses:  Closed nondisplaced intertrochanteric fracture of right femur, initial encounter [facility])    ED Discharge Orders    None       Ina Kick, MD 08/19/18 2212    Gerhard Munch, MD 08/21/18 1731

## 2018-08-19 NOTE — ED Triage Notes (Addendum)
Pt arrives via [facility] from home. Pt states he stumbled getting up and fell between two chairs on his concrete patio onto his right hip. Reported he felt nauseous and threw up a little bit after he fell. Does not report nausea at this time. Pt states pain is an 8 with movement. Pt is on a blood thinner Eliquis. Pt reports hitting the left side of his head on his fall.

## 2018-08-20 DIAGNOSIS — I5022 Chronic systolic (congestive) heart failure: Secondary | ICD-10-CM

## 2018-08-20 DIAGNOSIS — S72141A Displaced intertrochanteric fracture of right femur, initial encounter for closed fracture: Secondary | ICD-10-CM

## 2018-08-20 DIAGNOSIS — I4819 Other persistent atrial fibrillation: Secondary | ICD-10-CM

## 2018-08-20 LAB — SURGICAL PCR SCREEN
MRSA, PCR: NEGATIVE
Staphylococcus aureus: NEGATIVE

## 2018-08-20 LAB — GLUCOSE, CAPILLARY
Glucose-Capillary: 140 mg/dL — ABNORMAL HIGH (ref 70–99)
Glucose-Capillary: 202 mg/dL — ABNORMAL HIGH (ref 70–99)
Glucose-Capillary: 48 mg/dL — ABNORMAL LOW (ref 70–99)
Glucose-Capillary: 60 mg/dL — ABNORMAL LOW (ref 70–99)
Glucose-Capillary: 71 mg/dL (ref 70–99)
Glucose-Capillary: 75 mg/dL (ref 70–99)
Glucose-Capillary: 99 mg/dL (ref 70–99)

## 2018-08-20 LAB — BASIC METABOLIC PANEL
Anion gap: 17 — ABNORMAL HIGH (ref 5–15)
Anion gap: 14 (ref 5–15)
BUN: 106 mg/dL — ABNORMAL HIGH (ref 8–23)
BUN: 98 mg/dL — ABNORMAL HIGH (ref 8–23)
CO2: 16 mmol/L — ABNORMAL LOW (ref 22–32)
CO2: 18 mmol/L — ABNORMAL LOW (ref 22–32)
Calcium: 9 mg/dL (ref 8.9–10.3)
Calcium: 8.8 mg/dL — ABNORMAL LOW (ref 8.9–10.3)
Chloride: 103 mmol/L (ref 98–111)
Chloride: 103 mmol/L (ref 98–111)
Creatinine, Ser: 6.1 mg/dL — ABNORMAL HIGH (ref 0.61–1.24)
Creatinine, Ser: 5.68 mg/dL — ABNORMAL HIGH (ref 0.61–1.24)
GFR calc Af Amer: 9 mL/min — ABNORMAL LOW (ref >60)
GFR calc Af Amer: 10 mL/min — ABNORMAL LOW (ref >60)
GFR calc non Af Amer: 8 mL/min — ABNORMAL LOW (ref >60)
GFR calc non Af Amer: 9 mL/min — ABNORMAL LOW (ref >60)
Glucose, Bld: 73 mg/dL (ref 70–99)
Glucose, Bld: 110 mg/dL — ABNORMAL HIGH (ref 70–99)
Potassium: 3.4 mmol/L — ABNORMAL LOW (ref 3.5–5.1)
Potassium: 3.2 mmol/L — ABNORMAL LOW (ref 3.5–5.1)
Sodium: 136 mmol/L (ref 135–145)
Sodium: 135 mmol/L (ref 135–145)

## 2018-08-20 LAB — SODIUM, URINE, RANDOM: Sodium, Ur: 54 mmol/L

## 2018-08-20 LAB — CREATININE, URINE, RANDOM: Creatinine, Urine: 50.64 mg/dL

## 2018-08-20 MED ORDER — ENSURE ENLIVE PO LIQD
237.0000 mL | Freq: Two times a day (BID) | ORAL | Status: DC
  Administered 2018-08-20 – 2018-08-22 (×3): 237 mL via ORAL

## 2018-08-20 MED ORDER — POVIDONE-IODINE 10 % EX SWAB: 2.0000 "application " | Freq: Once | CUTANEOUS | Status: DC

## 2018-08-20 MED ORDER — CEFAZOLIN SODIUM-DEXTROSE 2-4 GM/100ML-% IV SOLN
2.0000 g | INTRAVENOUS | Status: AC
  Administered 2018-08-21: 2 g via INTRAVENOUS
  Filled 2018-08-20: qty 100

## 2018-08-20 MED ORDER — TRANEXAMIC ACID 1000 MG/10ML IV SOLN
2000.0000 mg | INTRAVENOUS | Status: AC
  Administered 2018-08-21: 11:00:00 2000 mg via TOPICAL
  Filled 2018-08-20 (×2): qty 20

## 2018-08-20 MED ORDER — TRANEXAMIC ACID-NACL 1000-0.7 MG/100ML-% IV SOLN
1000.0000 mg | INTRAVENOUS | Status: AC
  Administered 2018-08-21: 1000 mg via INTRAVENOUS
  Filled 2018-08-20: qty 100

## 2018-08-20 MED ORDER — DEXTROSE 50 % IV SOLN
INTRAVENOUS | Status: AC
  Filled 2018-08-20: qty 50

## 2018-08-20 MED ORDER — INSULIN ASPART 100 UNIT/ML ~~LOC~~ SOLN
0.0000 [IU] | Freq: Three times a day (TID) | SUBCUTANEOUS | Status: DC
  Administered 2018-08-20: 1 [IU] via SUBCUTANEOUS
  Administered 2018-08-21 – 2018-08-22 (×3): 2 [IU] via SUBCUTANEOUS

## 2018-08-20 MED ORDER — DEXTROSE 5 % IV SOLN
INTRAVENOUS | Status: DC
  Administered 2018-08-20: 10:00:00 via INTRAVENOUS

## 2018-08-20 MED ORDER — DEXTROSE 50 % IV SOLN
25.0000 mL | Freq: Once | INTRAVENOUS | Status: AC
  Administered 2018-08-20: 25 mL via INTRAVENOUS

## 2018-08-20 NOTE — Progress Notes (Signed)
PROGRESS NOTE    Elijah Saunders  AVW:[id number] DOB: 1933/11/10 DOA: 08/19/2018 PCP: Aida Puffer, MD (Confirm with patient/family/NH records and if not entered, this HAS to be entered at [facility] point of entry. "No PCP" if truly none.)   Brief Narrative: Elijah Saunders is a 85 y.o. male with medical history significant of CHF dt ICM with EF 20-25%, A.Fib, DM2, CKD stage 3. Patient had mechanical fall while trying to get out of chair today.  Fell on R hip.  Severe pain with movement, unable to bear weight.  Ambulance called and pt brought to ED. R hip intertroch fx noted in the ED.  Also found to have AKI with creat of 6 and BUN just over 100.  Looks like he was 1.3 and 27 respectively back in Nov 2019. Patient has been in his normal state of health over the last few months has had a decreased appetite and some weight loss. EDP spoke to the wife over the phone who states that she was able to witness the fall and the patient has been at his normal mental baseline today. No recent fevers or URI symptoms. No changes in bowel or bladder habits that she is aware of.  Assessment & Plan:   Principal Problem:   Closed displaced intertrochanteric fracture of right femur (HCC) Active Problems:   Chronic systolic CHF (congestive heart failure) (HCC)   Chronic renal disease, stage III (HCC)   Type 2 diabetes mellitus with renal manifestations, controlled (HCC)   Cardiomyopathy, ischemic-EF 35-40%   Persistent atrial fibrillation   Acute kidney failure (HCC)   Closed left hip fracture, initial encounter (HCC)  Closed displaced intertrochanteric fx after mechanical fall- -Orthopedics following, appreciate insight and recommendations -Tentative plan for ORIF tomorrow on the 29th -Pain well controlled currently -Etiology consulted for cardiac clearance given patient's history of A. fib, ischemic cardiomyopathy, CAD history status post CABG -appears to be at least moderate risk for moderate risk surgery  -further to cardiology for further need for evaluation. -Most recent echocardiogram 03/28/2018: "Severe LV dysfunction and dilation. Likely severe MR (borderline by PISA), carpentier class 3b. Echo contrast used to define wall motion and exclude LV thrombus. Mild AR with mild AS, biatrial dilation, moderate TR, moderate PR. Elevated right sided pressures."  AKI on CKD stage 3, likely multifactorial etiology -Likely post-renal obstruction with concurrent prerenal dehydration-patient also on diuretics which only further clouds the picture, baseline creatinine around 1.3/1.4 -Questionably post renal, patient had large volume output after Foley catheter placement -Patient denies any overt history of BPH but indicates weak stream and hesitancy and urgency in the recent past -Patient transferred to D5W as below given ongoing hypoglycemia -transition back to normal saline glucose levels have improved -Continue holding home lasix -Fina calculated overnight at 4.7%, consistent with obstructive nephropathy although given recent lasix and salt wasting this is likely to make this calculation less accurate -Cr stable overnight - follow this afternoon - if remains elevated despite foley and IVF will call Nephro -CT renal stone study essentially unremarkable other than incidentally noted abdominal aneurysm  NIDDM2 -Hold PO DM meds: metformin  -Sensitive SSI AC -Patient moderately hypoglycemic overnight and early this morning, not improved with D50 x2 -started on D5 water at 70 cc -may transition off later this afternoon if patient becomes hyperglycemic to normal saline  Chronic systolic CHF 2/2 ischemic cardiomyopathy, not in acute exacerbation -Hold lasix given AKI as above -Patient appears dehydrated if anything -certainly not volume overloaded at this point -  Cont Imdur, metoprolol -Watch for fluid overload with rehydration.  4.7 cm suprarenal abdominal aortic aneurysm, incidentally noted -Incidentally  noted on CT scan as above; consider vascular follow up in the outpatient setting -Of note patient also has a ascending thoracic aortic ectasia at 4.1cm -which was noted in 2017 -unclear if this was ever followed up on, no repeat imaging available in our system to verify  Chronic A.Fib, currently rate controlled not in RVR -Hold eliquis -Cont metoprolol -Will hold amiodarone just in case, but not usually a cause of AKF.  Also not likely to make difference in A.Fib during hospital stay given the exceptionally long half life of amiodarone.  CAD/MI history, s/p CABG 2003 -Cardiology consulted, per orthopedics requested for cardiac clearance preoperatively -Patient remains chest pain free, otherwise continue core measures occluding metoprolol, Imdur -Patient not on aspirin due to full dose anticoagulation as above -Patient not on ACE/ARB due to CKD -Patient intolerant to statins  HTN -Oertli well-controlled, continue Imdur, metoprolol  HLD -Intolerant to statins, recommending strict dietary restrictions  DVT prophylaxis: SCDs only perioperatively, can resume patient's home Eliquis once cleared by surgery Code Status: Full code Family Communication: None available by phone today Disposition Plan: Surgical clearance and evaluation, tentative surgical repair of hip fracture tomorrow on the 29th   Consultants:   Orthopedic surgery  Cardiology  Procedures:   Pending fracture repair with orthopedics on 08/21/2018  Subjective: No acute issues or events overnight, patient feels quite well currently, other than poor appetite denies any current symptoms: Declines chest pain, shortness of breath, nausea, vomiting, diarrhea, constipation, headache, fevers, chills.  Objective: Vitals:   08/19/18 1915 08/19/18 2115 08/19/18 2335 08/20/18 0336  BP: 101/64 117/66 119/76 120/83  Pulse:  71 78 60  Resp: 20 15 16 16   Temp:   97.8 F (36.6 C) 97.6 F (36.4 C)  TempSrc:   Oral Oral  SpO2:  95%  98% 98%  Weight:      Height:        Intake/Output Summary (Last 24 hours) at 08/20/2018 1246 Last data filed at 08/20/2018 1100 Gross per 24 hour  Intake 995.83 ml  Output 475 ml  Net 520.83 ml   Filed Weights   08/19/18 1904  Weight: 60.3 kg    Examination:  General:  Pleasantly resting in bed, No acute distress. HEENT:  Normocephalic atraumatic.  Sclerae nonicteric, noninjected.  Extraocular movements intact bilaterally. Neck:  Without mass or deformity.  Trachea is midline. Lungs:  Clear to auscultate bilaterally without rhonchi, wheeze, or rales. Heart:  Regular rate and rhythm.  Without murmurs, rubs, or gallops. Abdomen:  Soft, nontender, nondistended.  Without guarding or rebound. Extremities: Without cyanosis, clubbing, edema, or obvious deformity. Vascular:  Dorsalis pedis and posterior tibial pulses palpable bilaterally. Skin:  Warm and dry, no erythema, no ulcerations.  Data Reviewed: I have personally reviewed following labs and imaging studies  CBC: Recent Labs  Lab 08/19/18 1918  WBC 11.9*  NEUTROABS 9.6*  HGB 10.3*  HCT 31.0*  MCV 93.4  PLT 210   Basic Metabolic Panel: Recent Labs  Lab 08/19/18 1918 08/20/18 0144  NA 135 136  K 3.6 3.4*  CL 100 103  CO2 22 16*  GLUCOSE 56* 73  BUN 109* 106*  CREATININE 6.18* 6.10*  CALCIUM 9.2 9.0   GFR: Estimated Creatinine Clearance: 8 mL/min (A) (by C-G formula based on SCr of 6.1 mg/dL (H)). Liver Function Tests: Recent Labs  Lab 08/19/18 1918  AST 37  ALT 44  ALKPHOS 74  BILITOT 0.7  PROT 6.9  ALBUMIN 3.3*   No results for input(s): LIPASE, AMYLASE in the last 168 hours. No results for input(s): AMMONIA in the last 168 hours. Coagulation Profile: No results for input(s): INR, PROTIME in the last 168 hours. Cardiac Enzymes: No results for input(s): CKTOTAL, CKMB, CKMBINDEX, TROPONINI in the last 168 hours. BNP (last 3 results) No results for input(s): PROBNP in the last 8760  hours. HbA1C: No results for input(s): HGBA1C in the last 72 hours. CBG: Recent Labs  Lab 08/19/18 2345 08/20/18 0027 08/20/18 0729 08/20/18 0822 08/20/18 0847  GLUCAP 49* 75 48* 60* 202*   Lipid Profile: No results for input(s): CHOL, HDL, LDLCALC, TRIG, CHOLHDL, LDLDIRECT in the last 72 hours. Thyroid Function Tests: No results for input(s): TSH, T4TOTAL, FREET4, T3FREE, THYROIDAB in the last 72 hours. Anemia Panel: No results for input(s): VITAMINB12, FOLATE, FERRITIN, TIBC, IRON, RETICCTPCT in the last 72 hours. Sepsis Labs: No results for input(s): PROCALCITON, LATICACIDVEN in the last 168 hours.  Recent Results (from the past 240 hour(s))  Surgical pcr screen     Status: None   Collection Time: 08/20/18 12:49 AM  Result Value Ref Range Status   MRSA, PCR NEGATIVE NEGATIVE Final   Staphylococcus aureus NEGATIVE NEGATIVE Final    Comment: (NOTE) The Xpert SA Assay (FDA approved for NASAL specimens in patients 54 years of age and older), is one component of a comprehensive surveillance program. It is not intended to diagnose infection nor to guide or monitor treatment. Performed at [facility], [address], [place], [place] [address]          Radiology Studies: Dg Chest 1 View  Result Date: 08/19/2018 CLINICAL DATA:  85 year old male with a history of hip fracture EXAM: CHEST  1 VIEW COMPARISON:  01/14/2018, 09/04/2015 FINDINGS: Cardiomediastinal silhouette unchanged. Surgical changes of median sternotomy and CABG. Low lung volumes with reticular opacities throughout. No pneumothorax or pleural effusion. Mild fullness in the central vasculature with interlobular septal thickening. No confluent airspace disease. IMPRESSION: Low lung volumes with evidence of edema superimposed on chronic changes. Surgical changes of median sternotomy and CABG Electronically Signed   By: Gilmer Mor D.O.   On: 08/19/2018 21:04   Dg Pelvis 1-2 Views  Result Date:  08/19/2018 CLINICAL DATA:  Fall at home today. Pelvic right hip pain. Initial encounter. EXAM: PELVIS - 1-2 VIEW COMPARISON:  Right hip radiographs also obtained today FINDINGS: There is no evidence of pelvic fracture or diastasis. No pelvic bone lesions are seen. An intertrochanteric right hip fracture is seen. IMPRESSION: No evidence of pelvic fracture. Right hip fracture noted; see separate right hip x-ray report. Electronically Signed   By: Myles Rosenthal M.D.   On: 08/19/2018 20:54   Ct Head Wo Contrast  Result Date: 08/19/2018 CLINICAL DATA:  85 year old male status post fall on concrete patio. Vomiting. EXAM: CT HEAD WITHOUT CONTRAST CT CERVICAL SPINE WITHOUT CONTRAST TECHNIQUE: Multidetector CT imaging of the head and cervical spine was performed following the standard protocol without intravenous contrast. Multiplanar CT image reconstructions of the cervical spine were also generated. COMPARISON:  None. FINDINGS: CT HEAD FINDINGS Brain: Cerebral volume is within normal limits for age. No ventriculomegaly. No midline shift, mass effect, or evidence of intracranial mass lesion. No acute intracranial hemorrhage identified. No cortically based acute infarct identified. Patchy and confluent bilateral cerebral white matter hypodensity. Bilateral basal ganglia lacunar infarcts, appearing more age indeterminate on the  right (series 3, image 20). Small chronic appearing linear right cerebellar infarct on image 9. Vascular: Calcified atherosclerosis at the skull base. No suspicious intracranial vascular hyperdensity. Skull: Osteopenia.  No acute osseous abnormality identified. Sinuses/Orbits: Visualized paranasal sinuses and mastoids are well pneumatized. Other: Calcified scalp vessel atherosclerosis. No scalp hematoma. Negative orbits. CT CERVICAL SPINE FINDINGS Alignment: Preserved cervical lordosis. Cervicothoracic junction alignment is within normal limits. Bilateral posterior element alignment is within normal  limits. Skull base and vertebrae: Osteopenia. Visualized skull base is intact. No atlanto-occipital dissociation. No acute osseous abnormality identified. Soft tissues and spinal canal: No prevertebral fluid or swelling. No visible canal hematoma. Bulky calcified carotid atherosclerosis in the neck. Disc levels: Left C7 cervical rib. Degenerative ligamentous hypertrophy about the odontoid. Disc degeneration with vacuum disc at C5-C6. Mild if any degenerative cervical spinal stenosis. Upper chest: Partially visible apically lung scarring. Grossly intact visible upper thoracic levels. IMPRESSION: 1. No acute traumatic injury identified in the head or cervical spine. 2. Small vessel ischemia of the brain with age indeterminate bilateral basal ganglia lacunar infarcts. 3. Calcified atherosclerosis. Electronically Signed   By: Odessa Fleming M.D.   On: 08/19/2018 20:14   Ct Cervical Spine Wo Contrast  Result Date: 08/19/2018 CLINICAL DATA:  85 year old male status post fall on concrete patio. Vomiting. EXAM: CT HEAD WITHOUT CONTRAST CT CERVICAL SPINE WITHOUT CONTRAST TECHNIQUE: Multidetector CT imaging of the head and cervical spine was performed following the standard protocol without intravenous contrast. Multiplanar CT image reconstructions of the cervical spine were also generated. COMPARISON:  None. FINDINGS: CT HEAD FINDINGS Brain: Cerebral volume is within normal limits for age. No ventriculomegaly. No midline shift, mass effect, or evidence of intracranial mass lesion. No acute intracranial hemorrhage identified. No cortically based acute infarct identified. Patchy and confluent bilateral cerebral white matter hypodensity. Bilateral basal ganglia lacunar infarcts, appearing more age indeterminate on the right (series 3, image 20). Small chronic appearing linear right cerebellar infarct on image 9. Vascular: Calcified atherosclerosis at the skull base. No suspicious intracranial vascular hyperdensity. Skull:  Osteopenia.  No acute osseous abnormality identified. Sinuses/Orbits: Visualized paranasal sinuses and mastoids are well pneumatized. Other: Calcified scalp vessel atherosclerosis. No scalp hematoma. Negative orbits. CT CERVICAL SPINE FINDINGS Alignment: Preserved cervical lordosis. Cervicothoracic junction alignment is within normal limits. Bilateral posterior element alignment is within normal limits. Skull base and vertebrae: Osteopenia. Visualized skull base is intact. No atlanto-occipital dissociation. No acute osseous abnormality identified. Soft tissues and spinal canal: No prevertebral fluid or swelling. No visible canal hematoma. Bulky calcified carotid atherosclerosis in the neck. Disc levels: Left C7 cervical rib. Degenerative ligamentous hypertrophy about the odontoid. Disc degeneration with vacuum disc at C5-C6. Mild if any degenerative cervical spinal stenosis. Upper chest: Partially visible apically lung scarring. Grossly intact visible upper thoracic levels. IMPRESSION: 1. No acute traumatic injury identified in the head or cervical spine. 2. Small vessel ischemia of the brain with age indeterminate bilateral basal ganglia lacunar infarcts. 3. Calcified atherosclerosis. Electronically Signed   By: Odessa Fleming M.D.   On: 08/19/2018 20:14   Dg Knee Complete 4 Views Right  Result Date: 08/19/2018 CLINICAL DATA:  Fall at home today. Right knee pain. Initial encounter. EXAM: RIGHT KNEE - COMPLETE 4+ VIEW COMPARISON:  None. FINDINGS: No evidence of fracture, dislocation, or joint effusion. Moderate medial compartment osteoarthritis is seen. 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 IMPRESSION: 1. No acute findings. 2. Moderate medial compartment osteoarthritis. Electronically Signed  By: Myles Rosenthal M.D.   On: 08/19/2018 20:56   Ct Renal Stone Study  Result Date: 08/19/2018 CLINICAL DATA:  Acute renal failure.  Weight loss. Anorexia. EXAM: CT ABDOMEN AND PELVIS WITHOUT CONTRAST TECHNIQUE: Multidetector CT imaging of the abdomen and pelvis was performed following the standard protocol without IV contrast. COMPARISON:  None. FINDINGS: Lower chest: No acute findings. Hepatobiliary: No mass visualized on this unenhanced exam. Probable tiny gallstones noted, however there is no evidence of cholecystitis or biliary duct dilatation. Pancreas: No mass or inflammatory process visualized on this unenhanced exam. Spleen:  Within normal limits in size. Adrenals/Urinary tract: Normal appearance of both adrenal glands. Left kidney is absent. Right renal vascular calcifications noted, without definite calculi. No evidence of ureteral calculi or hydronephrosis. A few tiny calculi are noted within the urinary bladder. Stomach/Bowel: No evidence of obstruction, inflammatory process, or abnormal fluid collections. Diverticulosis is seen mainly involving the descending and sigmoid colon, however there is no evidence of diverticulitis. Vascular/Lymphatic: No pathologically enlarged lymph nodes identified. A suprarenal abdominal aortic aneurysm is seen measuring 4.7 cm in maximum diameter. Reproductive:  No mass or other significant abnormality. Other:  None. Musculoskeletal:  No suspicious bone lesions identified. IMPRESSION: 1. No acute findings. 2. Probable tiny gallstones. No radiographic evidence of cholecystitis. 3. Colonic diverticulosis, without radiographic evidence of cholecystitis. 4. Tiny calculi within urinary bladder. No evidence of ureteral calculi or hydronephrosis. 5. 4.7 cm suprarenal abdominal aortic aneurysm. Recommend followup by abdomen and pelvis CTA in 6 months, and vascular surgery referral/consultation if not already obtained. This recommendation follows ACR consensus guidelines: White Paper of the ACR Incidental Findings Committee II on Vascular Findings. J Am Coll Radiol 2013; 10:789-794. Electronically Signed   By:  Myles Rosenthal M.D.   On: 08/19/2018 22:53   Dg Femur Min 2 Views Right  Result Date: 08/19/2018 CLINICAL DATA:  Fall at home today. Right hip and thigh pain. Initial encounter. EXAM: RIGHT FEMUR 2 VIEWS COMPARISON:  None. FINDINGS: A nondisplaced intertrochanteric right hip fracture is seen. No evidence of distal femur fracture. Extensive peripheral vascular calcification noted. IMPRESSION: Nondisplaced intertrochanteric right hip fracture. Electronically Signed   By: Myles Rosenthal M.D.   On: 08/19/2018 20:55        Scheduled Meds:  allopurinol  300 mg Oral Daily   feeding supplement (ENSURE ENLIVE)  237 mL Oral BID BM   insulin aspart  0-9 Units Subcutaneous TID WC   isosorbide mononitrate  30 mg Oral Daily   levothyroxine  100 mcg Oral QAC breakfast   metoprolol tartrate  25 mg Oral BID   multivitamin  1 tablet Oral BID   polyvinyl alcohol  1 drop Both Eyes Daily   [START ON 08/21/2018] tranexamic acid (CYKLOKAPRON) topical -INTRAOP  2,000 mg Topical To OR   Continuous Infusions:  sodium chloride 75 mL/hr at 08/19/18 2344   dextrose 70 mL/hr at 08/20/18 0947   [START ON 08/21/2018] tranexamic acid       LOS: 1 day    Time spent:  Azucena Fallen, DO [facility] Pager [phone]  If 7PM-7AM, please contact night-coverage [email or web address] Password [email or web address] 08/20/2018, 12:46 PM

## 2018-08-20 NOTE — Progress Notes (Signed)
Hypoglycemic Event  CBG: 49  Treatment: 8 oz juice/soda  Symptoms: None  Follow-up CBG: Time:0027 CBG Result:75  Possible Reasons for Event: Inadequate meal intake  Comments/MD notified:K.Schorr,NP via text page    Montel Clock

## 2018-08-20 NOTE — Plan of Care (Signed)
  Problem: Pain Management: Goal: Pain level will decrease Outcome: Progressing   

## 2018-08-20 NOTE — Social Work (Signed)
CSW acknowledging consult for SNF placement. Will follow for therapy recommendations.   Laura Radilla, MSW, LCSWA [place] [facility] [phone]   

## 2018-08-20 NOTE — Progress Notes (Signed)
Initial Nutrition Assessment  RD working remotely.  DOCUMENTATION CODES:   Not applicable  INTERVENTION:   -Continue MVI daily -Continue Ensure Enlive po BID, each supplement provides 350 kcal and 20 grams of protein  NUTRITION DIAGNOSIS:   Increased nutrient needs related to hip fracture as evidenced by estimated needs.  GOAL:   Patient will meet greater than or equal to 90% of their needs  MONITOR:   PO intake, Supplement acceptance, Labs, Weight trends, Skin, I & O's  REASON FOR ASSESSMENT:   Consult, Malnutrition Screening Tool Hip fracture protocol  ASSESSMENT:   Elijah Saunders is a 85 y.o. male with medical history significant of CHF dt ICM with EF 20-25%, A.Fib, DM2, CKD stage 3.  Pt admitted with closed displaced intertochanteric fracture of rt femur.   Reviewed I/O's: +681 ml x 24 hours   UOP: 75 ml x 24 hours  Per orthopedics notes, plan for ORIF tomorrow, 08/21/18.   Attempted to speak with pt via phone, however, call was disconnected when RD called into pt room.   Per H&P, pt with decreased appetite and wt loss over the past few months per pt wife report. Reviewed wt hx; noted pt has experienced a 11.2% wt loss over the past 6 months, which is significant for time frame.  Per meal completion records, pt consumed 100% of breakfast this AM. He is currently on D5 due to ongoing hypoglycemic episodes.  Pt with poor oral intake PTA and hypoglycemia this admission; would benefit from nutrient dense supplement. One Ensure Enlive supplement provides 350 kcals, 20 grams protein, and 44-45 grams of carbohydrate vs one Glucerna shake supplement, which provides 220 kcals, 10 grams of protein, and 26 grams of carbohydrate. Given pt's hx of DM, RD will continue to monitor PO intake, CBGS, and adjust supplement regimen as appropriate.   Lab Results  Component Value Date   HGBA1C 6.3 (H) 09/07/2015   PTA DM medications are 1 mg glimepiride daily and 500 mg metformin  BID   Labs reviewed: CBGS: 48-202 (inpatient orders for glycemic control are 0-9 units insulin aspart TID with meals).   NUTRITION - FOCUSED PHYSICAL EXAM:    Most Recent Value  Orbital Region  Unable to assess  Upper Arm Region  Unable to assess  Thoracic and Lumbar Region  Unable to assess  Buccal Region  Unable to assess  Temple Region  Unable to assess  Clavicle Bone Region  Unable to assess  Clavicle and Acromion Bone Region  Unable to assess  Scapular Bone Region  Unable to assess  Dorsal Hand  Unable to assess  Patellar Region  Unable to assess  Anterior Thigh Region  Unable to assess  Posterior Calf Region  Unable to assess  Edema (RD Assessment)  Unable to assess  Hair  Unable to assess  Eyes  Unable to assess  Mouth  Unable to assess  Skin  Unable to assess  Nails  Unable to assess       Diet Order:   Diet Order            Diet NPO time specified Except for: Sips with Meds  Diet effective midnight        Diet NPO time specified Except for: Sips with Meds  Diet effective midnight        Diet Carb Modified Fluid consistency: Thin; Room service appropriate? Yes  Diet effective now              EDUCATION NEEDS:  No education needs have been identified at this time  Skin:  Skin Assessment: Reviewed RN Assessment  Last BM:  08/19/18  Height:   Ht Readings from Last 1 Encounters:  08/19/18 5\' 8"  (1.727 m)    Weight:   Wt Readings from Last 1 Encounters:  08/19/18 60.3 kg    Ideal Body Weight:  70 kg  BMI:  Body mass index is 20.22 kg/m.  Estimated Nutritional Needs:   Kcal:  1600-1800  Protein:  75-90 grams  Fluid:  > 1.6 L    Raini Tiley A. Mayford Knife, RD, LDN, CDCES Registered Dietitian II Certified Diabetes Care and Education Specialist Pager: [phone] After hours Pager: [phone]

## 2018-08-20 NOTE — Consult Note (Signed)
ORTHOPAEDIC CONSULTATION  REQUESTING PHYSICIAN: Azucena Fallen, MD  Chief Complaint: Right intertrochanteric hip fracture  HPI: Elijah Saunders is a 85 y.o. male who presents with right fracture s/p mechanical fall at home.  He struck his head but denies LOC.  He's not sure exactly what caused him to fall.  The patient endorses severe pain in the right hip, that does not radiate, grinding in quality, worse with any movement, better with immobilization.  Denies LOC/fever/chills/nausea/vomiting.  Walks without assistive devices (walker, cane, wheelchair).  Lives with wife at home.  Family members live nearby.  Denies LOC, neck pain, abd pain.  Past Medical History:  Diagnosis Date   AAA (abdominal aortic aneurysm) (HCC) 2003   s/p AAA R&G June 2003   CAD (coronary artery disease) 2003   s/p CABG, 2003    CHF (congestive heart failure) (HCC)    EF 35-40% by echo, May 2017   Chronic renal insufficiency, stage III (moderate) (HCC)    HTN (hypertension)    Hypercholesteremia    statin intol   MI (myocardial infarction) (HCC)    SBO (small bowel obstruction) [facility]) Nov 2002   Past Surgical History:  Procedure Laterality Date   ABDOMINAL AORTIC ANEURYSM REPAIR  June 2003   CARDIOVERSION N/A 09/08/2015   Procedure: CARDIOVERSION;  Surgeon: Laurey Morale, MD;  Location: [facility] ENDOSCOPY;  Service: Cardiovascular;  Laterality: N/A;   CARDIOVERSION N/A 03/20/2018   Procedure: CARDIOVERSION;  Surgeon: Vesta Mixer, MD;  Location: [facility] ENDOSCOPY;  Service: Cardiovascular;  Laterality: N/A;   CORONARY ARTERY BYPASS GRAFT  March 2003   x 4   HERNIA REPAIR     NEPHRECTOMY Left    1955   RIGHT/LEFT HEART CATH AND CORONARY/GRAFT ANGIOGRAPHY N/A 12/19/2016   Procedure: RIGHT/LEFT HEART CATH AND CORONARY/GRAFT ANGIOGRAPHY;  Surgeon: Lyn Records, MD;  Location: [facility] INVASIVE CV LAB;  Service: Cardiovascular;  Laterality: N/A;   TEE WITHOUT CARDIOVERSION N/A 09/08/2015   Procedure: TRANSESOPHAGEAL ECHOCARDIOGRAM (TEE);  Surgeon: Laurey Morale, MD;  Location: [facility] ENDOSCOPY;  Service: Cardiovascular;  Laterality: N/A;   Social History   Socioeconomic History   Marital status: Married    Spouse name: Not on file   Number of children: Not on file   Years of education: Not on file   Highest education level: Not on file  Occupational History   Not on file  Social Needs   Financial resource strain: Not on file   Food insecurity:    Worry: Not on file    Inability: Not on file   Transportation needs:    Medical: Not on file    Non-medical: Not on file  Tobacco Use   Smoking status: Former Smoker    Packs/day: 1.00    Years: 40.00    Pack years: 40.00    Types: Cigarettes    Last attempt to quit: 04/25/1995    Years since quitting: 23.3   Smokeless tobacco: Never Used  Substance and Sexual Activity   Alcohol use: No    Alcohol/week: 0.0 standard drinks   Drug use: No   Sexual activity: Not on file  Lifestyle   Physical activity:    Days per week: Not on file    Minutes per session: Not on file   Stress: Not on file  Relationships   Social connections:    Talks on phone: Not on file    Gets together: Not on file    Attends religious service: Not on  file    Active member of club or organization: Not on file    Attends meetings of clubs or organizations: Not on file    Relationship status: Not on file  Other Topics Concern   Not on file  Social History Narrative   Not on file   Family History  Problem Relation Age of Onset   Alzheimer's disease Mother    Diabetes Mother    Gallbladder disease Mother    Cancer - Cervical Mother    Coronary artery disease Father    Diabetes Father    Allergies  Allergen Reactions   Ace Inhibitors Other (See Comments)    Renal insuffiencey   Atorvastatin Other (See Comments)    Intolerant to many statins, muscle pain   Crestor [Rosuvastatin Calcium] Other (See Comments)     Muscle pain   Lopid [Gemfibrozil] Other (See Comments)    Unknown reaction   Penicillins Other (See Comments)    Unknown Has patient had a PCN reaction causing immediate rash, facial/tongue/throat swelling, SOB or lightheadedness with hypotension: unknown Has patient had a PCN reaction causing severe rash involving mucus membranes or skin necrosis: unknown Has patient had a PCN reaction that required hospitalization: unknown Has patient had a PCN reaction occurring within the last 10 years: no  If all of the above answers are "NO", then may proceed with Cephalosporin use.    Zetia [Ezetimibe]     Leg cramps   Prior to Admission medications   Medication Sig Start Date End Date Taking? Authorizing Provider  acetaminophen (TYLENOL) 325 MG tablet Take 650 mg by mouth every 6 (six) hours as needed for headache (pain).   Yes [provider]  allopurinol (ZYLOPRIM) 300 MG tablet Take 300 mg by mouth daily.   Yes [provider]  amiodarone (PACERONE) 200 MG tablet Take 1 tablet (200 mg total) by mouth daily. 03/20/18  Yes Nahser, Deloris Ping, MD  cyanocobalamin 500 MCG tablet Take 500 mcg by mouth at bedtime. Vitamin b12   Yes [provider]  ELIQUIS 2.5 MG TABS tablet TAKE 1 TABLET BY MOUTH TWICE DAILY Patient taking differently: Take 2.5 mg by mouth 2 (two) times daily.  04/29/18  Yes Nahser, Deloris Ping, MD  furosemide (LASIX) 40 MG tablet Take 1 tablet (40 mg total) by mouth 2 (two) times daily. Patient taking differently: Take 40 mg by mouth 2 (two) times daily with breakfast and lunch.  03/13/18  Yes Nahser, Deloris Ping, MD  glimepiride (AMARYL) 1 MG tablet Take 1 mg by mouth daily with lunch.  07/08/12  Yes [provider]  isosorbide mononitrate (IMDUR) 30 MG 24 hr tablet TAKE 1 TABLET BY MOUTH DAILY Patient taking differently: Take 30 mg by mouth daily with lunch.  06/24/18  Yes Nahser, Deloris Ping, MD  levothyroxine (SYNTHROID, LEVOTHROID) 100 MCG tablet Take 100  mcg by mouth daily before breakfast. 08/23/15  Yes [provider]  Menthol, Topical Analgesic, (BLUE-EMU MAXIMUM STRENGTH EX) Apply 1 application topically 2 (two) times a day. For knee pain/ sore muscles   Yes [provider]  metFORMIN (GLUCOPHAGE) 500 MG tablet Take 500 mg by mouth 2 (two) times daily.    Yes [provider]  metoprolol tartrate (LOPRESSOR) 25 MG tablet Take 1 tablet (25 mg total) by mouth 2 (two) times daily. 03/04/18  Yes Nahser, Deloris Ping, MD  Multiple Vitamins-Minerals (PRESERVISION AREDS 2) CAPS Take 1 capsule by mouth 2 (two) times daily.   Yes [provider]  Polyethyl Glycol-Propyl Glycol (SYSTANE ULTRA) 0.4-0.3 % SOLN Place 1 drop into both eyes daily.   Yes [provider]  Vitamin D, Ergocalciferol, (DRISDOL) 50000 units CAPS capsule Take 50,000 Units by mouth every Wednesday.   Yes [provider]  ACCU-CHEK SMARTVIEW test strip Use as directed to test blood glucose each morning. 08/23/15   [provider]   Dg Chest 1 View  Result Date: 08/19/2018 CLINICAL DATA:  85 year old male with a history of hip fracture EXAM: CHEST  1 VIEW COMPARISON:  01/14/2018, 09/04/2015 FINDINGS: Cardiomediastinal silhouette unchanged. Surgical changes of median sternotomy and CABG. Low lung volumes with reticular opacities throughout. No pneumothorax or pleural effusion. Mild fullness in the central vasculature with interlobular septal thickening. No confluent airspace disease. IMPRESSION: Low lung volumes with evidence of edema superimposed on chronic changes. Surgical changes of median sternotomy and CABG Electronically Signed   By: Gilmer MorJaime  Wagner D.O.   On: 08/19/2018 21:04   Dg Pelvis 1-2 Views  Result Date: 08/19/2018 CLINICAL DATA:  Fall at home today. Pelvic right hip pain. Initial encounter. EXAM: PELVIS - 1-2 VIEW COMPARISON:  Right hip radiographs also obtained today FINDINGS: There is no evidence of pelvic fracture or  diastasis. No pelvic bone lesions are seen. An intertrochanteric right hip fracture is seen. IMPRESSION: No evidence of pelvic fracture. Right hip fracture noted; see separate right hip x-ray report. Electronically Signed   By: Myles RosenthalJohn  Stahl M.D.   On: 08/19/2018 20:54   Ct Head Wo Contrast  Result Date: 08/19/2018 CLINICAL DATA:  [age] year old male status post fall on concrete patio. Vomiting. EXAM: CT HEAD WITHOUT CONTRAST CT CERVICAL SPINE WITHOUT CONTRAST TECHNIQUE: Multidetector CT imaging of the head and cervical spine was performed following the standard protocol without intravenous contrast. Multiplanar CT image reconstructions of the cervical spine were also generated. COMPARISON:  None. FINDINGS: CT HEAD FINDINGS Brain: Cerebral volume is within normal limits for age. No ventriculomegaly. No midline shift, mass effect, or evidence of intracranial mass lesion. No acute intracranial hemorrhage identified. No cortically based acute infarct identified. Patchy and confluent bilateral cerebral white matter hypodensity. Bilateral basal ganglia lacunar infarcts, appearing more age indeterminate on the right (series 3, image 20). Small chronic appearing linear right cerebellar infarct on image 9. Vascular: Calcified atherosclerosis at the skull base. No suspicious intracranial vascular hyperdensity. Skull: Osteopenia.  No acute osseous abnormality identified. Sinuses/Orbits: Visualized paranasal sinuses and mastoids are well pneumatized. Other: Calcified scalp vessel atherosclerosis. No scalp hematoma. Negative orbits. CT CERVICAL SPINE FINDINGS Alignment: Preserved cervical lordosis. Cervicothoracic junction alignment is within normal limits. Bilateral posterior element alignment is within normal limits. Skull base and vertebrae: Osteopenia. Visualized skull base is intact. No atlanto-occipital dissociation. No acute osseous abnormality identified. Soft tissues and spinal canal: No prevertebral fluid or swelling.  No visible canal hematoma. Bulky calcified carotid atherosclerosis in the neck. Disc levels: Left C7 cervical rib. Degenerative ligamentous hypertrophy about the odontoid. Disc degeneration with vacuum disc at C5-C6. Mild if any degenerative cervical spinal stenosis. Upper chest: Partially visible apically lung scarring. Grossly intact visible upper thoracic levels. IMPRESSION: 1. No acute traumatic injury identified in the head or cervical spine. 2. Small vessel ischemia of the brain with age indeterminate bilateral basal ganglia lacunar infarcts. 3. Calcified atherosclerosis. Electronically Signed   By: Odessa FlemingH  Hall M.D.   On: 08/19/2018 20:14   Ct Cervical Spine Wo Contrast  Result Date: 08/19/2018 CLINICAL DATA:  85 year old male status post fall on concrete patio. Vomiting.  EXAM: CT HEAD WITHOUT CONTRAST CT CERVICAL SPINE WITHOUT CONTRAST TECHNIQUE: Multidetector CT imaging of the head and cervical spine was performed following the standard protocol without intravenous contrast. Multiplanar CT image reconstructions of the cervical spine were also generated. COMPARISON:  None. FINDINGS: CT HEAD FINDINGS Brain: Cerebral volume is within normal limits for age. No ventriculomegaly. No midline shift, mass effect, or evidence of intracranial mass lesion. No acute intracranial hemorrhage identified. No cortically based acute infarct identified. Patchy and confluent bilateral cerebral white matter hypodensity. Bilateral basal ganglia lacunar infarcts, appearing more age indeterminate on the right (series 3, image 20). Small chronic appearing linear right cerebellar infarct on image 9. Vascular: Calcified atherosclerosis at the skull base. No suspicious intracranial vascular hyperdensity. Skull: Osteopenia.  No acute osseous abnormality identified. Sinuses/Orbits: Visualized paranasal sinuses and mastoids are well pneumatized. Other: Calcified scalp vessel atherosclerosis. No scalp hematoma. Negative orbits. CT CERVICAL  SPINE FINDINGS Alignment: Preserved cervical lordosis. Cervicothoracic junction alignment is within normal limits. Bilateral posterior element alignment is within normal limits. Skull base and vertebrae: Osteopenia. Visualized skull base is intact. No atlanto-occipital dissociation. No acute osseous abnormality identified. Soft tissues and spinal canal: No prevertebral fluid or swelling. No visible canal hematoma. Bulky calcified carotid atherosclerosis in the neck. Disc levels: Left C7 cervical rib. Degenerative ligamentous hypertrophy about the odontoid. Disc degeneration with vacuum disc at C5-C6. Mild if any degenerative cervical spinal stenosis. Upper chest: Partially visible apically lung scarring. Grossly intact visible upper thoracic levels. IMPRESSION: 1. No acute traumatic injury identified in the head or cervical spine. 2. Small vessel ischemia of the brain with age indeterminate bilateral basal ganglia lacunar infarcts. 3. Calcified atherosclerosis. Electronically Signed   By: Odessa Fleming M.D.   On: 08/19/2018 20:14   Dg Knee Complete 4 Views Right  Result Date: 08/19/2018 CLINICAL DATA:  Fall at home today. Right knee pain. Initial encounter. EXAM: RIGHT KNEE - COMPLETE 4+ VIEW COMPARISON:  None. FINDINGS: No evidence of fracture, dislocation, or joint effusion. Moderate medial compartment osteoarthritis is seen. 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 IMPRESSION: 1. No acute findings. 2. Moderate medial compartment osteoarthritis. Electronically Signed   By: Myles Rosenthal M.D.   On: 08/19/2018 20:56   Ct Renal Stone Study  Result Date: 08/19/2018 CLINICAL DATA:  Acute renal failure. Weight loss. Anorexia. EXAM: CT ABDOMEN AND PELVIS WITHOUT CONTRAST TECHNIQUE: Multidetector CT imaging of the abdomen and pelvis was performed following the standard protocol without IV contrast. COMPARISON:  None. FINDINGS:  Lower chest: No acute findings. Hepatobiliary: No mass visualized on this unenhanced exam. Probable tiny gallstones noted, however there is no evidence of cholecystitis or biliary duct dilatation. Pancreas: No mass or inflammatory process visualized on this unenhanced exam. Spleen:  Within normal limits in size. Adrenals/Urinary tract: Normal appearance of both adrenal glands. Left kidney is absent. Right renal vascular calcifications noted, without definite calculi. No evidence of ureteral calculi or hydronephrosis. A few tiny calculi are noted within the urinary bladder. Stomach/Bowel: No evidence of obstruction, inflammatory process, or abnormal fluid collections. Diverticulosis is seen mainly involving the descending and sigmoid colon, however there is no evidence of diverticulitis. Vascular/Lymphatic: No pathologically enlarged lymph nodes identified. A suprarenal abdominal aortic aneurysm is seen measuring 4.7 cm in maximum diameter. Reproductive:  No mass or other significant abnormality. Other:  None. Musculoskeletal:  No suspicious bone lesions identified. IMPRESSION: 1. No acute findings. 2. Probable tiny gallstones. No radiographic evidence of cholecystitis.  3. Colonic diverticulosis, without radiographic evidence of cholecystitis. 4. Tiny calculi within urinary bladder. No evidence of ureteral calculi or hydronephrosis. 5. 4.7 cm suprarenal abdominal aortic aneurysm. Recommend followup by abdomen and pelvis CTA in 6 months, and vascular surgery referral/consultation if not already obtained. This recommendation follows ACR consensus guidelines: White Paper of the ACR Incidental Findings Committee II on Vascular Findings. J Am Coll Radiol 2013; 10:789-794. Electronically Signed   By: Myles Rosenthal M.D.   On: 08/19/2018 22:53   Dg Femur Min 2 Views Right  Result Date: 08/19/2018 CLINICAL DATA:  Fall at home today. Right hip and thigh pain. Initial encounter. EXAM: RIGHT FEMUR 2 VIEWS COMPARISON:  None.  FINDINGS: A nondisplaced intertrochanteric right hip fracture is seen. No evidence of distal femur fracture. Extensive peripheral vascular calcification noted. IMPRESSION: Nondisplaced intertrochanteric right hip fracture. Electronically Signed   By: Myles Rosenthal M.D.   On: 08/19/2018 20:55    All pertinent xrays, MRI, CT independently reviewed and interpreted  Positive ROS: All other systems have been reviewed and were otherwise negative with the exception of those mentioned in the HPI and as above.  Physical Exam: General: Alert, no acute distress Cardiovascular: No pedal edema Respiratory: No cyanosis, no use of accessory musculature GI: No organomegaly, abdomen is soft and non-tender Skin: No lesions in the area of chief complaint Neurologic: Sensation intact distally Psychiatric: Patient is competent for consent with normal mood and affect Lymphatic: No axillary or cervical lymphadenopathy  MUSCULOSKELETAL:  - severe pain with movement of the hip and extremity - skin intact - NVI distally - compartments soft  Assessment: Right intertroch fracture  Plan: - surgical stabilization is recommended, patient and family are aware of r/b/a and wish to proceed, informed consent obtained - medical and cardiac optimization  - surgery is planned for Wednesday - on eliquis for afib - will resume postop - has extensive cardiac history, would recommend cardiac consult for preop clearance  Thank you for the consult and the opportunity to see Mr. Aloha Gell, MD [facility] [phone] 7:46 AM

## 2018-08-20 NOTE — Progress Notes (Signed)
Upon shift change rounds, noted foley with bloody output. Pt denies  Foley pain or traumatic entry during placement. Per day shift nurse Osborne Casco) she was not aware of the above. Noted no order for foley at this time. Foley placed by day shift nurse Z. Calwitan. Per notes pt had urinary retention.   I secured foley with a stat lock and updated charge nurse.

## 2018-08-20 NOTE — Progress Notes (Signed)
Hypoglycemic Event  CBG:40  Treatment: po given but ineffective  D50 given  Symptoms: weakness  Follow-up CBG:0847 CBG Result:202  Possible Reasons for Event: poor intake  Comments/MD notified yes, awaiting response    Aleksis Jiggetts, [employer]

## 2018-08-20 NOTE — Progress Notes (Signed)
Pt arrived to floor from ED. Pt alert and oriented x4. Oriented to room and call bell. 

## 2018-08-20 NOTE — Consult Note (Signed)
Cardiology Consultation:   Patient ID: Elijah Saunders; [id number]; November 03, 1933   Admit date: 08/19/2018 Date of Consult: 08/20/2018  Primary Care Provider: Aida Puffer, MD Primary Cardiologist: Dr. [name], MD  Patient Profile:   Elijah Saunders is a 85 y.o. male with a hx of coronary artery disease s/p prior anterior and inferior myocardial infarction, s/p CABG in 2003, ischemic cardiomyopathy, chronic systolic heart failure,abdominal aortic aneurysms/p repair,persistent atrial fibrillation, chronic kidney disease s/p left nephrectomy (1955), hypertension and hyperlipidemia who is being seen today for preoperative evaluation at the request of Dr. Roda Shutters.   History of Present Illness:   Elijah Saunders is an 85 year old male with a history as stated above who presented to [facility] on 08/20/2018 after a mechanical fall while getting up from his rocking chair earlier today in which he sustained a right intertrochanteric fracture with tentative plans for surgical stabilization on Wednesday after medical and cardiac clearance obtained. Patient states that he was sitting in a chair at his home when was trying to get and and ended up falling onto the floor. He reports no prodromal symptoms or loss of consciousness prior or after his fall.  Per chart review, EDP spoke with wife over the phone who states that she witnessed the fall and the patient had been in his normal state of health prior to today.  He has had no recent fevers or URI symptoms.  He denies chest pain, shortness of breath, PND, orthopnea symptoms, dizziness, presyncopal or syncopal episodes. Given the above, EMS was called for transport to the emergency department for further evaluation.  In the ED, imaging revealed right intertrochanteric fracture.  Unfortunately, his lab values revealed a creatinine of 6.18 on presentation with a baseline of 1.35.  As stated above, he has a prior history of a left nephrectomy since 1955.  EKG with atrial  fibrillation with rate control and LBBB (known) otherwise no acute ischemic changes.  CXR with low lung volumes with evidence of edema superimposed on chronic changes.  Given significant rise and creatinine a renal CT was performed which showed no acute findings with a tiny calculi within urinary bladder and no evidence of urethral or hydronephrosis.  There was a 4.7 cm suprarenal abdominal aortic aneurysm that will need to be followed by abdominal and pelvis CTA within 6 months and vascular surgery referral/consultation if not already obtained.  He was last seen in 69-month follow-up for CAD 08/08/2018 via virtual visit.  Per chart review, he underwent a cardiac catheterization 11/2016 which demonstrated severe native CAD with an occluded RCA, LCx and LAD.  LIMA to LAD, SVG to RCA grafts were patent. Vein graft to the diagonal and obtuse marginal were both occluded. He was then evaluated by Dr. Ladona Ridgel 12/2016 for consideration of prophylactic ICD implantation and given his advanced age, it was decided to hold off on ICD implantation unless he developed syncope or ventricular tachycardia.  His last echocardiogram 03/28/2009 showed LVEF of 20 to 25% with diffuse hypokinesis and was noted to not have tolerated ACE I/ARB in the past secondary to developing acute renal insufficiency.  He was previously seen 04/10/2018 by his primary cardiologist, Dr. [name] and was doing well from a cardiac standpoint.  During his last office visit, via virtual visit he denied chest pain, shortness of breath, orthopnea, PND, syncope, lower extremity edema or orthopnea symptoms.  He was able to ambulate in his house without any complication including chest pain or other anginal symptoms.  According to the  perioperative assessment evaluation tool and the [facility] activity status index, (performs 5.62 METS) the revised cardiac risk index indicates that he is considered a class IV risk with a 15% 30-day risk of death, MI or cardiac arrest with  surgery. The patient states that he ambulates within his house without complication or the use of assistive devices. He does light housework as well as mows his grass on a weekly basis with no anginal symptoms or shortness of breath.  While he is at high risk given his history of CAD s/p CABG, hypertension, systolic CHF, DM 2 and a 4.7 cm abdominal aortic aneurysm, no further testing is indicated at this time. Case will be discussed with rounding MD for definitive assessment and recommendations.  Past Medical History:  Diagnosis Date   AAA (abdominal aortic aneurysm) (HCC) 2003   s/p AAA R&G June 2003   CAD (coronary artery disease) 2003   s/p CABG, 2003    CHF (congestive heart failure) (HCC)    EF 35-40% by echo, May 2017   Chronic renal insufficiency, stage III (moderate) (HCC)    HTN (hypertension)    Hypercholesteremia    statin intol   MI (myocardial infarction) (HCC)    SBO (small bowel obstruction) [facility]) Nov 2002    Past Surgical History:  Procedure Laterality Date   ABDOMINAL AORTIC ANEURYSM REPAIR  June 2003   CARDIOVERSION N/A 09/08/2015   Procedure: CARDIOVERSION;  Surgeon: Laurey Morale, MD;  Location: [facility] ENDOSCOPY;  Service: Cardiovascular;  Laterality: N/A;   CARDIOVERSION N/A 03/20/2018   Procedure: CARDIOVERSION;  Surgeon: Vesta Mixer, MD;  Location: [facility] ENDOSCOPY;  Service: Cardiovascular;  Laterality: N/A;   CORONARY ARTERY BYPASS GRAFT  March 2003   x 4   HERNIA REPAIR     NEPHRECTOMY Left    1955   RIGHT/LEFT HEART CATH AND CORONARY/GRAFT ANGIOGRAPHY N/A 12/19/2016   Procedure: RIGHT/LEFT HEART CATH AND CORONARY/GRAFT ANGIOGRAPHY;  Surgeon: Lyn Records, MD;  Location: [facility] INVASIVE CV LAB;  Service: Cardiovascular;  Laterality: N/A;   TEE WITHOUT CARDIOVERSION N/A 09/08/2015   Procedure: TRANSESOPHAGEAL ECHOCARDIOGRAM (TEE);  Surgeon: Laurey Morale, MD;  Location: [facility] ENDOSCOPY;  Service: Cardiovascular;  Laterality: N/A;     Prior to  Admission medications   Medication Sig Start Date End Date Taking? Authorizing Provider  acetaminophen (TYLENOL) 325 MG tablet Take 650 mg by mouth every 6 (six) hours as needed for headache (pain).   Yes [provider]  allopurinol (ZYLOPRIM) 300 MG tablet Take 300 mg by mouth daily.   Yes [provider]  amiodarone (PACERONE) 200 MG tablet Take 1 tablet (200 mg total) by mouth daily. 03/20/18  Yes Nahser, Deloris Ping, MD  cyanocobalamin 500 MCG tablet Take 500 mcg by mouth at bedtime. Vitamin b12   Yes [provider]  ELIQUIS 2.5 MG TABS tablet TAKE 1 TABLET BY MOUTH TWICE DAILY Patient taking differently: Take 2.5 mg by mouth 2 (two) times daily.  04/29/18  Yes Nahser, Deloris Ping, MD  furosemide (LASIX) 40 MG tablet Take 1 tablet (40 mg total) by mouth 2 (two) times daily. Patient taking differently: Take 40 mg by mouth 2 (two) times daily with breakfast and lunch.  03/13/18  Yes Nahser, Deloris Ping, MD  glimepiride (AMARYL) 1 MG tablet Take 1 mg by mouth daily with lunch.  07/08/12  Yes [provider]  isosorbide mononitrate (IMDUR) 30 MG 24 hr tablet TAKE 1 TABLET BY MOUTH DAILY Patient taking differently: Take 30  mg by mouth daily with lunch.  06/24/18  Yes Nahser, Deloris Ping, MD  levothyroxine (SYNTHROID, LEVOTHROID) 100 MCG tablet Take 100 mcg by mouth daily before breakfast. 08/23/15  Yes [provider]  Menthol, Topical Analgesic, (BLUE-EMU MAXIMUM STRENGTH EX) Apply 1 application topically 2 (two) times a day. For knee pain/ sore muscles   Yes [provider]  metFORMIN (GLUCOPHAGE) 500 MG tablet Take 500 mg by mouth 2 (two) times daily.    Yes [provider]  metoprolol tartrate (LOPRESSOR) 25 MG tablet Take 1 tablet (25 mg total) by mouth 2 (two) times daily. 03/04/18  Yes Nahser, Deloris Ping, MD  Multiple Vitamins-Minerals (PRESERVISION AREDS 2) CAPS Take 1 capsule by mouth 2 (two) times daily.   Yes [provider]  Polyethyl  Glycol-Propyl Glycol (SYSTANE ULTRA) 0.4-0.3 % SOLN Place 1 drop into both eyes daily.   Yes [provider]  Vitamin D, Ergocalciferol, (DRISDOL) 50000 units CAPS capsule Take 50,000 Units by mouth every Wednesday.   Yes [provider]  ACCU-CHEK SMARTVIEW test strip Use as directed to test blood glucose each morning. 08/23/15   [provider]    Inpatient Medications: Scheduled Meds:  allopurinol  300 mg Oral Daily   feeding supplement (ENSURE ENLIVE)  237 mL Oral BID BM   insulin aspart  0-9 Units Subcutaneous TID WC   isosorbide mononitrate  30 mg Oral Daily   levothyroxine  100 mcg Oral QAC breakfast   metoprolol tartrate  25 mg Oral BID   multivitamin  1 tablet Oral BID   polyvinyl alcohol  1 drop Both Eyes Daily   [START ON 08/21/2018] tranexamic acid (CYKLOKAPRON) topical -INTRAOP  2,000 mg Topical To OR   Continuous Infusions:  sodium chloride 75 mL/hr at 08/19/18 2344   dextrose 70 mL/hr at 08/20/18 0947   [START ON 08/21/2018] tranexamic acid     PRN Meds: HYDROcodone-acetaminophen, morphine injection  Allergies:    Allergies  Allergen Reactions   Ace Inhibitors Other (See Comments)    Renal insuffiencey   Atorvastatin Other (See Comments)    Intolerant to many statins, muscle pain   Crestor [Rosuvastatin Calcium] Other (See Comments)    Muscle pain   Zetia [Ezetimibe] Other (See Comments)    Leg cramps   Lopid [Gemfibrozil]     Unknown reaction   Penicillins Other (See Comments)    UNSPECIFIED REACTION  Has patient had a PCN reaction causing immediate rash, facial/tongue/throat swelling, SOB or lightheadedness with hypotension: unknown Has patient had a PCN reaction causing severe rash involving mucus membranes or skin necrosis: unknown Has patient had a PCN reaction that required hospitalization: unknown Has patient had a PCN reaction occurring within the last 10 years: no  If all of the above answers are "NO", then  may proceed with Cephalosporin use.     Social History:   Social History   Socioeconomic History   Marital status: Married    Spouse name: Not on file   Number of children: Not on file   Years of education: Not on file   Highest education level: Not on file  Occupational History   Not on file  Social Needs   Financial resource strain: Not on file   Food insecurity:    Worry: Not on file    Inability: Not on file   Transportation needs:    Medical: Not on file    Non-medical: Not on file  Tobacco Use   Smoking status: Former  Smoker    Packs/day: 1.00    Years: 40.00    Pack years: 40.00    Types: Cigarettes    Last attempt to quit: 04/25/1995    Years since quitting: 23.3   Smokeless tobacco: Never Used  Substance and Sexual Activity   Alcohol use: No    Alcohol/week: 0.0 standard drinks   Drug use: No   Sexual activity: Not on file  Lifestyle   Physical activity:    Days per week: Not on file    Minutes per session: Not on file   Stress: Not on file  Relationships   Social connections:    Talks on phone: Not on file    Gets together: Not on file    Attends religious service: Not on file    Active member of club or organization: Not on file    Attends meetings of clubs or organizations: Not on file    Relationship status: Not on file   Intimate partner violence:    Fear of current or ex partner: Not on file    Emotionally abused: Not on file    Physically abused: Not on file    Forced sexual activity: Not on file  Other Topics Concern   Not on file  Social History Narrative   Not on file    Family History:   Family History  Problem Relation Age of Onset   Alzheimer's disease Mother    Diabetes Mother    Gallbladder disease Mother    Cancer - Cervical Mother    Coronary artery disease Father    Diabetes Father    Family Status:  Family Status  Relation Name Status   Mother  Deceased at age 48   Father  Deceased at age 48    MGM  Deceased   MGF  Deceased   PGM  Deceased   PGF  Deceased    ROS:  Please see the history of present illness.  All other ROS reviewed and negative.     Physical Exam/Data:   Vitals:   08/19/18 1915 08/19/18 2115 08/19/18 2335 08/20/18 0336  BP: 101/64 117/66 119/76 120/83  Pulse:  71 78 60  Resp: Temp:   97.8 F (36.6 C) 97.6 F (36.4 C)  TempSrc:   Oral Oral  SpO2:  95% 98% 98%  Weight:      Height:        Intake/Output Summary (Last 24 hours) at 08/20/2018 1403 Last data filed at 08/20/2018 1100 Gross per 24 hour  Intake 995.83 ml  Output 475 ml  Net 520.83 ml   Filed Weights   08/19/18 1904  Weight: 60.3 kg   Body mass index is 20.22 kg/m.    General: Elderly, frail-appearing male in no distress HEENT: normal Lymph: no adenopathy Neck: no JVD Endocrine:  No thryomegaly Vascular: No carotid bruits; 4/4 extremity pulses 2+, without bruits  Cardiac:  normal S1, S2; irregular; 3/6 systolic murmur at the apex Lungs:  Scattered rhonchi bilaterally Abd: soft, nontender, no hepatomegaly  Ext: no edema Musculoskeletal:  The right leg is externally rotated Skin: warm and dry  Neuro:  CNs 2-12 intact, no focal abnormalities noted Psych:  Normal affect   EKG:  The EKG was personally reviewed and demonstrates:  08/20/2018 AF with LBBB (known) with rate control at 77bpm  Relevant CV Studies: Echo 03/28/18: Study Conclusions  - Left ventricle: The cavity size was moderately to severely dilated. Systolic function was  severely reduced. The estimated ejection fraction was in the range of 20% to 25%. Diffuse hypokinesis. Akinesis of the anteroseptal and anterior myocardium. Acoustic contrast opacification revealed no evidence ofthrombus. - Aortic valve: There was mild stenosis. There was moderate regurgitation. - Mitral valve: There was moderate to severe regurgitation. - Left atrium: The atrium was severely dilated. - Right  ventricle: The cavity size was moderately dilated. Wall thickness was normal. Systolic function was moderately reduced. - Right atrium: The atrium was moderately to severely dilated. - Tricuspid valve: There was moderate regurgitation. - Pulmonic valve: There was moderate regurgitation. - Pulmonary arteries: Systolic pressure was moderately to severely increased. PA peak pressure: 57 mm Hg (S).  Impressions:  - Severe LV dysfunction and dilation. Likely severe MR (borderline by PISA), carpentier class 3b. Echo contrast used to define wall motion and exclude LV thrombus. Mild AR with mild AS, biatrial dilation, moderate TR, moderate PR. Elevated right sided pressures.  RIGHT/LEFT HEART CATH AND CORONARY/GRAFT ANGIOGRAPHY  11/2016  Conclusion    Left ventricular systolic dysfunction with regional wall motion abnormality involving the inferior wall and apex greater than the anterior wall. Estimated ejection fraction 25-30%. LVEDP 18 millimeters mercury and consistent with chronic systolic heart failure.  Severe native vessel coronary disease with total occlusion of the RCA, mid circumflex, and mid LAD. Ostial 50% left main narrowing.  Bypass graft failure with occlusion of the graft to the diagonal and to the obtuse marginal.  Patent LIMA to the mid to distal LAD.   Patent saphenous vein graft to the distal RCA.  Normal right heart pressures including pulmonary capillary wedge of 10 mmHg  RECOMMENDATIONS:   GDMT and possible EP consultation.   Laboratory Data:  Chemistry Recent Labs  Lab 08/19/18 1918 08/20/18 0144  NA 135 136  K 3.6 3.4*  CL 100 103  CO2 22 16*  GLUCOSE 56* 73  BUN 109* 106*  CREATININE 6.18* 6.10*  CALCIUM 9.2 9.0  GFRNONAA 8* 8*  GFRAA 9* 9*  ANIONGAP 13 17*    Total Protein  Date Value Ref Range Status  08/19/2018 6.9 6.5 - 8.1 g/dL Final  16/01/9603 7.0 6.0 - 8.5 g/dL Final   Albumin  Date Value Ref Range Status    08/19/2018 3.3 (L) 3.5 - 5.0 g/dL Final  54/12/8117 4.1 3.5 - 4.7 g/dL Final   AST  Date Value Ref Range Status  08/19/2018 37 15 - 41 U/L Final   ALT  Date Value Ref Range Status  08/19/2018 44 0 - 44 U/L Final   Alkaline Phosphatase  Date Value Ref Range Status  08/19/2018 74 38 - 126 U/L Final   Total Bilirubin  Date Value Ref Range Status  08/19/2018 0.7 0.3 - 1.2 mg/dL Final   Bilirubin Total  Date Value Ref Range Status  03/13/2018 0.9 0.0 - 1.2 mg/dL Final   Hematology Recent Labs  Lab 08/19/18 1918  WBC 11.9*  RBC 3.32*  HGB 10.3*  HCT 31.0*  MCV 93.4  MCH 31.0  MCHC 33.2  RDW 18.5*  PLT 210   Cardiac EnzymesNo results for input(s): TROPONINI in the last 168 hours. No results for input(s): TROPIPOC in the last 168 hours.  BNPNo results for input(s): BNP, PROBNP in the last 168 hours.  DDimer No results for input(s): DDIMER in the last 168 hours. TSH:  Lab Results  Component Value Date   TSH 1.960 03/13/2018   Lipids: Lab Results  Component Value Date   CHOL 171  02/05/2018   HDL 36 (L) 02/05/2018   LDLCALC 101 (H) 02/05/2018   LDLDIRECT 125.2 11/06/2011   TRIG 168 (H) 02/05/2018   CHOLHDL 4.8 02/05/2018   HgbA1c: Lab Results  Component Value Date   HGBA1C 6.3 (H) 09/07/2015    Radiology/Studies:  Dg Chest 1 View  Result Date: 08/19/2018 CLINICAL DATA:  85 year old male with a history of hip fracture EXAM: CHEST  1 VIEW COMPARISON:  01/14/2018, 09/04/2015 FINDINGS: Cardiomediastinal silhouette unchanged. Surgical changes of median sternotomy and CABG. Low lung volumes with reticular opacities throughout. No pneumothorax or pleural effusion. Mild fullness in the central vasculature with interlobular septal thickening. No confluent airspace disease. IMPRESSION: Low lung volumes with evidence of edema superimposed on chronic changes. Surgical changes of median sternotomy and CABG Electronically Signed   By: Gilmer Mor D.O.   On: 08/19/2018 21:04    Dg Pelvis 1-2 Views  Result Date: 08/19/2018 CLINICAL DATA:  Fall at home today. Pelvic right hip pain. Initial encounter. EXAM: PELVIS - 1-2 VIEW COMPARISON:  Right hip radiographs also obtained today FINDINGS: There is no evidence of pelvic fracture or diastasis. No pelvic bone lesions are seen. An intertrochanteric right hip fracture is seen. IMPRESSION: No evidence of pelvic fracture. Right hip fracture noted; see separate right hip x-ray report. Electronically Signed   By: Myles Rosenthal M.D.   On: 08/19/2018 20:54   Ct Head Wo Contrast  Result Date: 08/19/2018 CLINICAL DATA:  85 year old male status post fall on concrete patio. Vomiting. EXAM: CT HEAD WITHOUT CONTRAST CT CERVICAL SPINE WITHOUT CONTRAST TECHNIQUE: Multidetector CT imaging of the head and cervical spine was performed following the standard protocol without intravenous contrast. Multiplanar CT image reconstructions of the cervical spine were also generated. COMPARISON:  None. FINDINGS: CT HEAD FINDINGS Brain: Cerebral volume is within normal limits for age. No ventriculomegaly. No midline shift, mass effect, or evidence of intracranial mass lesion. No acute intracranial hemorrhage identified. No cortically based acute infarct identified. Patchy and confluent bilateral cerebral white matter hypodensity. Bilateral basal ganglia lacunar infarcts, appearing more age indeterminate on the right (series 3, image 20). Small chronic appearing linear right cerebellar infarct on image 9. Vascular: Calcified atherosclerosis at the skull base. No suspicious intracranial vascular hyperdensity. Skull: Osteopenia.  No acute osseous abnormality identified. Sinuses/Orbits: Visualized paranasal sinuses and mastoids are well pneumatized. Other: Calcified scalp vessel atherosclerosis. No scalp hematoma. Negative orbits. CT CERVICAL SPINE FINDINGS Alignment: Preserved cervical lordosis. Cervicothoracic junction alignment is within normal limits. Bilateral  posterior element alignment is within normal limits. Skull base and vertebrae: Osteopenia. Visualized skull base is intact. No atlanto-occipital dissociation. No acute osseous abnormality identified. Soft tissues and spinal canal: No prevertebral fluid or swelling. No visible canal hematoma. Bulky calcified carotid atherosclerosis in the neck. Disc levels: Left C7 cervical rib. Degenerative ligamentous hypertrophy about the odontoid. Disc degeneration with vacuum disc at C5-C6. Mild if any degenerative cervical spinal stenosis. Upper chest: Partially visible apically lung scarring. Grossly intact visible upper thoracic levels. IMPRESSION: 1. No acute traumatic injury identified in the head or cervical spine. 2. Small vessel ischemia of the brain with age indeterminate bilateral basal ganglia lacunar infarcts. 3. Calcified atherosclerosis. Electronically Signed   By: Odessa Fleming M.D.   On: 08/19/2018 20:14   Ct Cervical Spine Wo Contrast  Result Date: 08/19/2018 CLINICAL DATA:  85 year old male status post fall on concrete patio. Vomiting. EXAM: CT HEAD WITHOUT CONTRAST CT CERVICAL SPINE WITHOUT CONTRAST TECHNIQUE: Multidetector CT imaging of the head and  cervical spine was performed following the standard protocol without intravenous contrast. Multiplanar CT image reconstructions of the cervical spine were also generated. COMPARISON:  None. FINDINGS: CT HEAD FINDINGS Brain: Cerebral volume is within normal limits for age. No ventriculomegaly. No midline shift, mass effect, or evidence of intracranial mass lesion. No acute intracranial hemorrhage identified. No cortically based acute infarct identified. Patchy and confluent bilateral cerebral white matter hypodensity. Bilateral basal ganglia lacunar infarcts, appearing more age indeterminate on the right (series 3, image 20). Small chronic appearing linear right cerebellar infarct on image 9. Vascular: Calcified atherosclerosis at the skull base. No suspicious  intracranial vascular hyperdensity. Skull: Osteopenia.  No acute osseous abnormality identified. Sinuses/Orbits: Visualized paranasal sinuses and mastoids are well pneumatized. Other: Calcified scalp vessel atherosclerosis. No scalp hematoma. Negative orbits. CT CERVICAL SPINE FINDINGS Alignment: Preserved cervical lordosis. Cervicothoracic junction alignment is within normal limits. Bilateral posterior element alignment is within normal limits. Skull base and vertebrae: Osteopenia. Visualized skull base is intact. No atlanto-occipital dissociation. No acute osseous abnormality identified. Soft tissues and spinal canal: No prevertebral fluid or swelling. No visible canal hematoma. Bulky calcified carotid atherosclerosis in the neck. Disc levels: Left C7 cervical rib. Degenerative ligamentous hypertrophy about the odontoid. Disc degeneration with vacuum disc at C5-C6. Mild if any degenerative cervical spinal stenosis. Upper chest: Partially visible apically lung scarring. Grossly intact visible upper thoracic levels. IMPRESSION: 1. No acute traumatic injury identified in the head or cervical spine. 2. Small vessel ischemia of the brain with age indeterminate bilateral basal ganglia lacunar infarcts. 3. Calcified atherosclerosis. Electronically Signed   By: Odessa Fleming M.D.   On: 08/19/2018 20:14   Dg Knee Complete 4 Views Right  Result Date: 08/19/2018 CLINICAL DATA:  Fall at home today. Right knee pain. Initial encounter. EXAM: RIGHT KNEE - COMPLETE 4+ VIEW COMPARISON:  None. FINDINGS: No evidence of fracture, dislocation, or joint effusion. Moderate medial compartment osteoarthritis is seen. 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 IMPRESSION: 1. No acute findings. 2. Moderate medial compartment osteoarthritis. Electronically Signed   By: Myles Rosenthal M.D.   On: 08/19/2018 20:56   Ct Renal Stone Study  Result Date:  08/19/2018 CLINICAL DATA:  Acute renal failure. Weight loss. Anorexia. EXAM: CT ABDOMEN AND PELVIS WITHOUT CONTRAST TECHNIQUE: Multidetector CT imaging of the abdomen and pelvis was performed following the standard protocol without IV contrast. COMPARISON:  None. FINDINGS: Lower chest: No acute findings. Hepatobiliary: No mass visualized on this unenhanced exam. Probable tiny gallstones noted, however there is no evidence of cholecystitis or biliary duct dilatation. Pancreas: No mass or inflammatory process visualized on this unenhanced exam. Spleen:  Within normal limits in size. Adrenals/Urinary tract: Normal appearance of both adrenal glands. Left kidney is absent. Right renal vascular calcifications noted, without definite calculi. No evidence of ureteral calculi or hydronephrosis. A few tiny calculi are noted within the urinary bladder. Stomach/Bowel: No evidence of obstruction, inflammatory process, or abnormal fluid collections. Diverticulosis is seen mainly involving the descending and sigmoid colon, however there is no evidence of diverticulitis. Vascular/Lymphatic: No pathologically enlarged lymph nodes identified. A suprarenal abdominal aortic aneurysm is seen measuring 4.7 cm in maximum diameter. Reproductive:  No mass or other significant abnormality. Other:  None. Musculoskeletal:  No suspicious bone lesions identified. IMPRESSION: 1. No acute findings. 2. Probable tiny gallstones. No radiographic evidence of cholecystitis. 3. Colonic diverticulosis, without radiographic evidence of cholecystitis. 4. Tiny calculi within urinary bladder. No evidence of ureteral  calculi or hydronephrosis. 5. 4.7 cm suprarenal abdominal aortic aneurysm. Recommend followup by abdomen and pelvis CTA in 6 months, and vascular surgery referral/consultation if not already obtained. This recommendation follows ACR consensus guidelines: White Paper of the ACR Incidental Findings Committee II on Vascular Findings. J Am Coll  Radiol 2013; 10:789-794. Electronically Signed   By: Myles Rosenthal M.D.   On: 08/19/2018 22:53   Dg Femur Min 2 Views Right  Result Date: 08/19/2018 CLINICAL DATA:  Fall at home today. Right hip and thigh pain. Initial encounter. EXAM: RIGHT FEMUR 2 VIEWS COMPARISON:  None. FINDINGS: A nondisplaced intertrochanteric right hip fracture is seen. No evidence of distal femur fracture. Extensive peripheral vascular calcification noted. IMPRESSION: Nondisplaced intertrochanteric right hip fracture. Electronically Signed   By: Myles Rosenthal M.D.   On: 08/19/2018 20:55   Assessment and Plan:   1.  Preoperative evaluation: -Patient presented to [facility] on 08/12/2018 after sustaining a mechanical fall after getting out of his rocking chair on his porch.  There was no loss of consciousness or prodromal symptoms prior or after his fall.  He reports that he has been in his usual state of health prior to this event.  He was last seen by cardiology service 08/08/2018 via virtual visit and was doing well from a cardiac standpoint.  -EKG with atrial fibrillation with rate control, LBBB with no acute ischemic changes -CXR with low lung volumes with evidence of edema superimposed on chronic changes.  Denies shortness of breath or other significant signs of fluid volume overload -Creatinine found to be 6.8 on presentation with a baseline of 1.35 and a history of left nephrectomy in 1955.  Unclear etiology.  Renal CT obtained which showed no acute findings and no evidence of urethral or hydronephrosis.  There was however a 4.7 cm suprarenal abdominal aortic aneurysm found on CT.  This will need to be followed with an abdominal pelvis CT within 6 months and vascular surgery referral/consultation -According to the perioperative assessment evaluation tool and the Duke activity status index, (performs 5.62 METS) the revised cardiac risk index indicates that he is considered a class IV risk with a 15% 30-day risk of death, MI or cardiac  arrest with surgery. The patient states that he ambulates within his house without complication or the use of assistive devices. He does light housework as well as mows his grass on a weekly basis with no anginal symptoms or shortness of breath.  While he is at high risk for surgical intervention given his history of CAD s/p CABG, hypertension, systolic CHF, DM 2 and a 4.7 cm abdominal aortic aneurysm, no further testing is indicated at this time. Case will be discussed with rounding MD for definitive assessment and recommendations.  2.  History of CAD status post CABG 2003: -Denies anginal symptoms -Not on ASA secondary to Eliquis 2.5mg  BID for PAF -Eliquis currently on hold secondary to upcoming surgical intervention -Continue metoprolol, Imdur -Patient with known statin intolerance  3.  Persistent atrial fibrillation: -EKG on presentation with atrial fibrillation with rate control at 77 bpm -On home chronic anticoagulation with Eliquis, reduced dose 2.5 mg twice daily>> on home amiodarone 200 mg daily which is currently on hold -History of elevated LFTs 03/13/2018>> resolved on last lab report 08/19/2018 -Eliquis currently on hold secondary to need for surgical intervention  4.  Ischemic cardiomyopathy: -Last echocardiogram 03/28/2018 with LVEF of 20 to 25% with diffuse hypokinesis, akinesis of the anterior septal and anterior myocardium.  There was moderate  to severe MR with a peak PA pressure of 57 mmHg -No complaints of dyspnea, orthopnea, PND or lower extremity edema -On home lasix 40 mg twice daily>>> currently on hold secondary to renal function  5.  Moderate to severe MR: -Moderate to severe MR noted on last echocardiogram 03/28/2018 -Echo initially obtained to define wall motion exclude LV thrombus -Patient denies symptoms at this time  6. 4.7cm suprarenal abdominal aortic aneurysm: -Found on abdomen and pelvis CT 08/20/2018 -Will need to be followed by abdominal and pelvis CTA  within 6 months and vascular surgery referral/consultation if not already obtained  7.  Hypertension: -Stable, 120/83, 119/76, 117/66 -Continue metoprolol 25 mg twice daily  8.  HLD: -Uncontrolled, last LDL 02/05/2018, 101 with a goal of 70mg /dl  -Known statin intolerance -Possible referral to lipid clinic in the future  9.  DM2: -SSI for glucose control while inpatient status -On home metformin 500 mg twice daily, glimepiride -Per primary team   For questions or updates, please contact [facility] Please consult [email or web address] for contact info under Cardiology/STEMI.   Raliegh IpSigned, Jill McDaniel NP-C [facility] Pager: [phone] 08/20/2018 2:03 PM  Patient seen, examined. Available data reviewed. Agree with findings, assessment, and plan as outlined by Georgie ChardJill McDaniel, NP-C. The physical exam findings outlined above reflect the findings of my personal exam of this patient today.  The patient's complex cardiac history is carefully reviewed.  He has a multitude of chronic cardiac problems including longstanding atrial fibrillation maintained on chronic oral anticoagulation, coronary artery disease status post CABG with severe ischemic cardiomyopathy and chronic systolic heart failure, and moderate to severe mitral regurgitation.  The patient does not have active symptoms of progressive heart failure or angina predating his fall and subsequent femur fracture.  While he is at high risk of surgery based on many cardiac problems outlined above, as well as acute renal failure, his risk does not appear to be prohibitive.  The morbidity of an unrepaired femur fracture far outweighs the cardiac-related operative risk and I would recommend proceeding with surgery without further preoperative cardiac testing.  We will be happy to follow along with the patient's care and help with his perioperative cardiac management.  Tonny BollmanMichael Ceferino Lang, M.D. 08/20/2018 4:46 PM

## 2018-08-21 ENCOUNTER — Encounter (HOSPITAL_COMMUNITY)
Admission: EM | Disposition: A | Discharge status: Rehabilitation Facility | Payer: Self-pay | Source: Home / Self Care | Attending: Internal Medicine

## 2018-08-21 ENCOUNTER — Inpatient Hospital Stay (HOSPITAL_COMMUNITY): Payer: Medicare Other | Admitting: Anesthesiology

## 2018-08-21 ENCOUNTER — Encounter (HOSPITAL_COMMUNITY): Payer: Self-pay

## 2018-08-21 ENCOUNTER — Inpatient Hospital Stay (HOSPITAL_COMMUNITY): Payer: Medicare Other

## 2018-08-21 HISTORY — PX: INTRAMEDULLARY (IM) NAIL INTERTROCHANTERIC: SHX5875

## 2018-08-21 LAB — GLUCOSE, CAPILLARY
Glucose-Capillary: 106 mg/dL — ABNORMAL HIGH (ref 70–99)
Glucose-Capillary: 89 mg/dL (ref 70–99)
Glucose-Capillary: 94 mg/dL (ref 70–99)
Glucose-Capillary: 170 mg/dL — ABNORMAL HIGH (ref 70–99)
Glucose-Capillary: 242 mg/dL — ABNORMAL HIGH (ref 70–99)

## 2018-08-21 LAB — MRSA PCR SCREENING: MRSA by PCR: NEGATIVE

## 2018-08-21 LAB — BASIC METABOLIC PANEL
Anion gap: 13 (ref 5–15)
BUN: 89 mg/dL — ABNORMAL HIGH (ref 8–23)
CO2: 19 mmol/L — ABNORMAL LOW (ref 22–32)
Calcium: 8.9 mg/dL (ref 8.9–10.3)
Chloride: 101 mmol/L (ref 98–111)
Creatinine, Ser: 5.34 mg/dL — ABNORMAL HIGH (ref 0.61–1.24)
GFR calc Af Amer: 11 mL/min — ABNORMAL LOW (ref >60)
GFR calc non Af Amer: 9 mL/min — ABNORMAL LOW (ref >60)
Glucose, Bld: 109 mg/dL — ABNORMAL HIGH (ref 70–99)
Potassium: 3.3 mmol/L — ABNORMAL LOW (ref 3.5–5.1)
Sodium: 133 mmol/L — ABNORMAL LOW (ref 135–145)

## 2018-08-21 SURGERY — FIXATION, FRACTURE, INTERTROCHANTERIC, WITH INTRAMEDULLARY ROD
Anesthesia: General | Site: Leg Upper | Laterality: Right

## 2018-08-21 MED ORDER — MIDAZOLAM HCL 2 MG/2ML IJ SOLN
INTRAMUSCULAR | Status: AC
  Filled 2018-08-21: qty 2

## 2018-08-21 MED ORDER — POLYETHYLENE GLYCOL 3350 17 G PO PACK: 17.0000 g | PACK | Freq: Every day | ORAL | Status: DC | PRN

## 2018-08-21 MED ORDER — SORBITOL 70 % SOLN: 30.0000 mL | Freq: Every day | Status: DC | PRN

## 2018-08-21 MED ORDER — HYDROMORPHONE HCL 1 MG/ML IJ SOLN
INTRAMUSCULAR | Status: AC
  Filled 2018-08-21: qty 1

## 2018-08-21 MED ORDER — LIDOCAINE 2% (20 MG/ML) 5 ML SYRINGE
INTRAMUSCULAR | Status: AC
  Filled 2018-08-21: qty 5

## 2018-08-21 MED ORDER — 0.9 % SODIUM CHLORIDE (POUR BTL) OPTIME
TOPICAL | Status: DC | PRN
  Administered 2018-08-21: 11:00:00 1000 mL

## 2018-08-21 MED ORDER — METHOCARBAMOL 1000 MG/10ML IJ SOLN
500.0000 mg | Freq: Four times a day (QID) | INTRAVENOUS | Status: DC | PRN
  Filled 2018-08-21: qty 5

## 2018-08-21 MED ORDER — HYDROCODONE-ACETAMINOPHEN 7.5-325 MG PO TABS
1.0000 | ORAL_TABLET | ORAL | Status: DC | PRN
  Administered 2018-08-22: 2 via ORAL
  Filled 2018-08-21: qty 2

## 2018-08-21 MED ORDER — ACETAMINOPHEN 325 MG PO TABS: 325.0000 mg | ORAL_TABLET | Freq: Four times a day (QID) | ORAL | Status: DC | PRN

## 2018-08-21 MED ORDER — SUCCINYLCHOLINE CHLORIDE 200 MG/10ML IV SOSY
PREFILLED_SYRINGE | INTRAVENOUS | Status: DC | PRN
  Administered 2018-08-21: 120 mg via INTRAVENOUS

## 2018-08-21 MED ORDER — ONDANSETRON HCL 4 MG/2ML IJ SOLN: 4.0000 mg | Freq: Four times a day (QID) | INTRAMUSCULAR | Status: DC | PRN

## 2018-08-21 MED ORDER — PROMETHAZINE HCL 25 MG/ML IJ SOLN: 6.2500 mg | INTRAMUSCULAR | Status: DC | PRN

## 2018-08-21 MED ORDER — CEFAZOLIN SODIUM-DEXTROSE 2-4 GM/100ML-% IV SOLN
2.0000 g | Freq: Four times a day (QID) | INTRAVENOUS | Status: AC
  Administered 2018-08-21 – 2018-08-22 (×3): 2 g via INTRAVENOUS
  Filled 2018-08-21 (×3): qty 100

## 2018-08-21 MED ORDER — MORPHINE SULFATE (PF) 2 MG/ML IV SOLN
1.0000 mg | INTRAVENOUS | Status: DC | PRN
  Administered 2018-08-21: 1 mg via INTRAVENOUS
  Filled 2018-08-21: qty 1

## 2018-08-21 MED ORDER — MIDAZOLAM HCL 5 MG/5ML IJ SOLN
INTRAMUSCULAR | Status: DC | PRN
  Administered 2018-08-21: 1 mg via INTRAVENOUS

## 2018-08-21 MED ORDER — HYDROMORPHONE HCL 1 MG/ML IJ SOLN
0.2500 mg | INTRAMUSCULAR | Status: DC | PRN
  Administered 2018-08-21: 0.25 mg via INTRAVENOUS

## 2018-08-21 MED ORDER — PROPOFOL 10 MG/ML IV BOLUS
INTRAVENOUS | Status: AC
  Filled 2018-08-21: qty 20

## 2018-08-21 MED ORDER — PHENYLEPHRINE 40 MCG/ML (10ML) SYRINGE FOR IV PUSH (FOR BLOOD PRESSURE SUPPORT)
PREFILLED_SYRINGE | INTRAVENOUS | Status: DC | PRN
  Administered 2018-08-21 (×2): 80 ug via INTRAVENOUS
  Administered 2018-08-21: 120 ug via INTRAVENOUS

## 2018-08-21 MED ORDER — APIXABAN 2.5 MG PO TABS
2.5000 mg | ORAL_TABLET | Freq: Two times a day (BID) | ORAL | Status: DC
  Administered 2018-08-22 – 2018-08-23 (×3): 2.5 mg via ORAL
  Filled 2018-08-21 (×4): qty 1

## 2018-08-21 MED ORDER — LIDOCAINE 2% (20 MG/ML) 5 ML SYRINGE
INTRAMUSCULAR | Status: DC | PRN
  Administered 2018-08-21: 60 mg via INTRAVENOUS

## 2018-08-21 MED ORDER — FENTANYL CITRATE (PF) 100 MCG/2ML IJ SOLN
INTRAMUSCULAR | Status: DC | PRN
  Administered 2018-08-21: 100 ug via INTRAVENOUS

## 2018-08-21 MED ORDER — OXYCODONE HCL 5 MG/5ML PO SOLN: 5.0000 mg | Freq: Once | ORAL | Status: DC | PRN

## 2018-08-21 MED ORDER — SUCCINYLCHOLINE CHLORIDE 200 MG/10ML IV SOSY
PREFILLED_SYRINGE | INTRAVENOUS | Status: AC
  Filled 2018-08-21: qty 10

## 2018-08-21 MED ORDER — PHENOL 1.4 % MT LIQD: 1.0000 | OROMUCOSAL | Status: DC | PRN

## 2018-08-21 MED ORDER — DEXAMETHASONE SODIUM PHOSPHATE 10 MG/ML IJ SOLN
INTRAMUSCULAR | Status: AC
  Filled 2018-08-21: qty 1

## 2018-08-21 MED ORDER — ACETAMINOPHEN 500 MG PO TABS
500.0000 mg | ORAL_TABLET | Freq: Four times a day (QID) | ORAL | Status: AC
  Administered 2018-08-21 – 2018-08-22 (×3): 500 mg via ORAL
  Filled 2018-08-21 (×3): qty 1

## 2018-08-21 MED ORDER — MENTHOL 3 MG MT LOZG: 1.0000 | LOZENGE | OROMUCOSAL | Status: DC | PRN

## 2018-08-21 MED ORDER — FENTANYL CITRATE (PF) 250 MCG/5ML IJ SOLN
INTRAMUSCULAR | Status: AC
  Filled 2018-08-21: qty 5

## 2018-08-21 MED ORDER — PROPOFOL 10 MG/ML IV BOLUS
INTRAVENOUS | Status: DC | PRN
  Administered 2018-08-21: 70 mg via INTRAVENOUS

## 2018-08-21 MED ORDER — HYDROCODONE-ACETAMINOPHEN 5-325 MG PO TABS
1.0000 | ORAL_TABLET | ORAL | Status: DC | PRN
  Administered 2018-08-21 – 2018-08-23 (×3): 2 via ORAL
  Filled 2018-08-21 (×3): qty 2

## 2018-08-21 MED ORDER — METHOCARBAMOL 500 MG PO TABS: 500.0000 mg | ORAL_TABLET | Freq: Four times a day (QID) | ORAL | Status: DC | PRN

## 2018-08-21 MED ORDER — ONDANSETRON HCL 4 MG/2ML IJ SOLN
INTRAMUSCULAR | Status: DC | PRN
  Administered 2018-08-21: 4 mg via INTRAVENOUS

## 2018-08-21 MED ORDER — ALUM & MAG HYDROXIDE-SIMETH 200-200-20 MG/5ML PO SUSP: 30.0000 mL | ORAL | Status: DC | PRN

## 2018-08-21 MED ORDER — ONDANSETRON HCL 4 MG/2ML IJ SOLN
INTRAMUSCULAR | Status: AC
  Filled 2018-08-21: qty 2

## 2018-08-21 MED ORDER — OXYCODONE-ACETAMINOPHEN 5-325 MG PO TABS: 1.0000 | ORAL_TABLET | Freq: Four times a day (QID) | ORAL | 0 refills | Status: DC | PRN

## 2018-08-21 MED ORDER — SODIUM CHLORIDE 0.9 % IV SOLN
INTRAVENOUS | Status: DC | PRN
  Administered 2018-08-21: 11:00:00 100 ug/min via INTRAVENOUS

## 2018-08-21 MED ORDER — SODIUM CHLORIDE 0.9 % IV SOLN: INTRAVENOUS | Status: DC

## 2018-08-21 MED ORDER — DOCUSATE SODIUM 100 MG PO CAPS
100.0000 mg | ORAL_CAPSULE | Freq: Two times a day (BID) | ORAL | Status: DC
  Administered 2018-08-21 – 2018-08-23 (×4): 100 mg via ORAL
  Filled 2018-08-21 (×4): qty 1

## 2018-08-21 MED ORDER — SODIUM CHLORIDE 0.9 % IV SOLN
INTRAVENOUS | Status: DC
  Administered 2018-08-21 – 2018-08-22 (×2): via INTRAVENOUS

## 2018-08-21 MED ORDER — MAGNESIUM CITRATE PO SOLN: 1.0000 | Freq: Once | ORAL | Status: DC | PRN

## 2018-08-21 MED ORDER — EPHEDRINE SULFATE-NACL 50-0.9 MG/10ML-% IV SOSY
PREFILLED_SYRINGE | INTRAVENOUS | Status: DC | PRN
  Administered 2018-08-21: 15 mg via INTRAVENOUS

## 2018-08-21 MED ORDER — TRANEXAMIC ACID-NACL 1000-0.7 MG/100ML-% IV SOLN
INTRAVENOUS | Status: AC
  Filled 2018-08-21: qty 100

## 2018-08-21 MED ORDER — DEXAMETHASONE SODIUM PHOSPHATE 10 MG/ML IJ SOLN
INTRAMUSCULAR | Status: DC | PRN
  Administered 2018-08-21: 5 mg via INTRAVENOUS

## 2018-08-21 MED ORDER — ONDANSETRON HCL 4 MG PO TABS: 4.0000 mg | ORAL_TABLET | Freq: Four times a day (QID) | ORAL | Status: DC | PRN

## 2018-08-21 MED ORDER — PHENYLEPHRINE 40 MCG/ML (10ML) SYRINGE FOR IV PUSH (FOR BLOOD PRESSURE SUPPORT)
PREFILLED_SYRINGE | INTRAVENOUS | Status: AC
  Filled 2018-08-21: qty 10

## 2018-08-21 MED ORDER — OXYCODONE HCL 5 MG PO TABS: 5.0000 mg | ORAL_TABLET | Freq: Once | ORAL | Status: DC | PRN

## 2018-08-21 MED ORDER — EPHEDRINE 5 MG/ML INJ
INTRAVENOUS | Status: AC
  Filled 2018-08-21: qty 10

## 2018-08-21 MED ORDER — SODIUM CHLORIDE 0.9 % IV SOLN
INTRAVENOUS | Status: DC
  Administered 2018-08-21 (×2): via INTRAVENOUS

## 2018-08-21 SURGICAL SUPPLY — 45 items
BIT DRILL SHORT 4.0 (BIT) IMPLANT
BNDG COHESIVE 4X5 TAN NS LF (GAUZE/BANDAGES/DRESSINGS) ×3 IMPLANT
BNDG COHESIVE 6X5 TAN STRL LF (GAUZE/BANDAGES/DRESSINGS) IMPLANT
BNDG GAUZE ELAST 4 BULKY (GAUZE/BANDAGES/DRESSINGS) ×3 IMPLANT
COVER PERINEAL POST (MISCELLANEOUS) ×3 IMPLANT
COVER SURGICAL LIGHT HANDLE (MISCELLANEOUS) ×3 IMPLANT
COVER WAND RF STERILE (DRAPES) ×3 IMPLANT
DRAPE STERI IOBAN 125X83 (DRAPES) ×3 IMPLANT
DRILL BIT SHORT 4.0 (BIT) ×3
DRSG MEPILEX BORDER 4X4 (GAUZE/BANDAGES/DRESSINGS) ×7 IMPLANT
DRSG MEPILEX BORDER 4X8 (GAUZE/BANDAGES/DRESSINGS) ×3 IMPLANT
DRSG PAD ABDOMINAL 8X10 ST (GAUZE/BANDAGES/DRESSINGS) ×6 IMPLANT
DURAPREP 26ML APPLICATOR (WOUND CARE) ×3 IMPLANT
ELECT REM PT RETURN 9FT ADLT (ELECTROSURGICAL) ×3
ELECTRODE REM PT RTRN 9FT ADLT (ELECTROSURGICAL) ×1 IMPLANT
GLOVE BIOGEL PI IND STRL 7.0 (GLOVE) ×1 IMPLANT
GLOVE BIOGEL PI INDICATOR 7.0 (GLOVE) ×2
GLOVE ECLIPSE 7.0 STRL STRAW (GLOVE) ×3 IMPLANT
GLOVE SKINSENSE NS SZ7.5 (GLOVE) ×4
GLOVE SKINSENSE STRL SZ7.5 (GLOVE) ×2 IMPLANT
GOWN STRL REIN XL XLG (GOWN DISPOSABLE) ×3 IMPLANT
GUIDE PIN 3.2X343 (PIN) ×2
GUIDE PIN 3.2X343MM (PIN) ×6
KIT BASIN OR (CUSTOM PROCEDURE TRAY) ×3 IMPLANT
KIT TURNOVER KIT B (KITS) ×3 IMPLANT
MANIFOLD NEPTUNE II (INSTRUMENTS) ×3 IMPLANT
NAIL TRIGEN INTERTAN RT 10X40 (Nail) ×2 IMPLANT
NS IRRIG 1000ML POUR BTL (IV SOLUTION) ×3 IMPLANT
PACK GENERAL/GYN (CUSTOM PROCEDURE TRAY) ×3 IMPLANT
PAD ARMBOARD 7.5X6 YLW CONV (MISCELLANEOUS) ×6 IMPLANT
PAD CAST 4YDX4 CTTN HI CHSV (CAST SUPPLIES) ×2 IMPLANT
PADDING CAST COTTON 4X4 STRL (CAST SUPPLIES) ×6
PIN GUIDE 3.2X343MM (PIN) IMPLANT
SCREW LAG COMPR KIT 100/95 (Screw) ×2 IMPLANT
SCREW TRIGEN LOW PROF 5.0X42.5 (Screw) ×2 IMPLANT
STAPLER SKIN PROX 35W (STAPLE) ×2 IMPLANT
STAPLER VISISTAT 35W (STAPLE) ×3 IMPLANT
SUT VIC AB 0 CT1 27 (SUTURE) ×6
SUT VIC AB 0 CT1 27XBRD ANBCTR (SUTURE) ×1 IMPLANT
SUT VIC AB 2-0 CT1 27 (SUTURE) ×6
SUT VIC AB 2-0 CT1 TAPERPNT 27 (SUTURE) ×1 IMPLANT
SUT VIC AB 2-0 FS1 27 (SUTURE) ×2 IMPLANT
TOWEL OR 17X24 6PK STRL BLUE (TOWEL DISPOSABLE) ×3 IMPLANT
TOWEL OR 17X26 10 PK STRL BLUE (TOWEL DISPOSABLE) ×3 IMPLANT
WATER STERILE IRR 1000ML POUR (IV SOLUTION) ×3 IMPLANT

## 2018-08-21 NOTE — Plan of Care (Signed)
  Problem: Clinical Measurements: Goal: Ability to maintain clinical measurements within normal limits will improve Outcome: Progressing   

## 2018-08-21 NOTE — Progress Notes (Signed)
Patient verbalizing he needs to urinate, explained to the patient that he got a foley, bladder scan done and noted >200 ml urine in the bladder, MD made aware that no urine output noted. Irrigate foley per MD order.

## 2018-08-21 NOTE — Progress Notes (Signed)
Per Dr. Roda Shutters no PT/INR needed; Ancef ok with hx of penicillin allergy.  Patient arrived to short stay from unit with a foley catheter in place.  It was noted that there was bloody output (this was received in report from patient's RN on 6N).  Patient does state he has pain with urination.

## 2018-08-21 NOTE — Transfer of Care (Signed)
Immediate Anesthesia Transfer of Care Note  Patient: Elijah Saunders  Procedure(s) Performed: INTRAMEDULLARY (IM) NAIL INTERTROCHANTRIC (Right Leg Upper)  Patient Location: PACU  Anesthesia Type:General  Level of Consciousness: drowsy and patient cooperative  Airway & Oxygen Therapy: Patient Spontanous Breathing and Patient connected to nasal cannula oxygen  Post-op Assessment: Report given to RN, Post -op Vital signs reviewed and stable and Patient moving all extremities  Post vital signs: Reviewed and stable  Last Vitals:  Vitals Value Taken Time  BP 107/68 08/21/2018 11:37 AM  Temp    Pulse 78 08/21/2018 11:38 AM  Resp 15 08/21/2018 11:38 AM  SpO2 98 % 08/21/2018 11:38 AM  Vitals shown include unvalidated device data.  Last Pain:  Vitals:   08/21/18 0915  TempSrc:   PainSc: 0-No pain      Patients Stated Pain Goal: 2 (08/20/18 1921)  Complications: No apparent anesthesia complications

## 2018-08-21 NOTE — Discharge Instructions (Signed)
    1. Change dressings as needed 2. May shower but keep incisions covered and dry 3. Take eliquis to prevent blood clots 4. Take stool softeners as needed 5. Take pain meds as needed   

## 2018-08-21 NOTE — Anesthesia Procedure Notes (Signed)
Procedure Name: Intubation Date/Time: 08/21/2018 10:26 AM Performed by: Moshe Salisbury, CRNA Pre-anesthesia Checklist: Patient identified, Emergency Drugs available, Suction available and Patient being monitored Patient Re-evaluated:Patient Re-evaluated prior to induction Oxygen Delivery Method: Circle System Utilized Preoxygenation: Pre-oxygenation with 100% oxygen Induction Type: IV induction Laryngoscope Size: Mac and 4 Grade View: Grade II Tube type: Oral Tube size: 8.0 mm Number of attempts: 1 Airway Equipment and Method: Stylet Placement Confirmation: ETT inserted through vocal cords under direct vision,  positive ETCO2 and breath sounds checked- equal and bilateral Secured at: 22 cm Tube secured with: Tape Dental Injury: Teeth and Oropharynx as per pre-operative assessment

## 2018-08-21 NOTE — Op Note (Signed)
° °  Date of Surgery: 08/21/2018  INDICATIONS: Elijah Saunders is a 85 y.o.-year-old male who sustained a right hip fracture. The risks and benefits of the procedure discussed with the patient prior to the procedure and all questions were answered; consent was obtained.  PREOPERATIVE DIAGNOSIS: right intertrochanteric hip fracture   POSTOPERATIVE DIAGNOSIS: Same   PROCEDURE: Treatment of intertrochanteric fracture with intramedullary implant. CPT [phone]   SURGEON: N. Glee Arvin, M.D.   ASSIST: Starlyn Skeans [place], [place]; necessary for the timely completion of procedure and due to complexity of procedure.  ANESTHESIA: general   IV FLUIDS AND URINE: See anesthesia record   ESTIMATED BLOOD LOSS: 200 cc   IMPLANTS: [employer] InterTAN 10 x 40, 100/95  DRAINS: None.   COMPLICATIONS: None.   DESCRIPTION OF PROCEDURE: The patient was brought to the operating room and placed supine on the operating table. The patient's leg had been signed prior to the procedure. The patient had the anesthesia placed by the anesthesiologist. The prep verification and incision time-outs were performed to confirm that this was the correct patient, site, side and location. The patient had an SCD on the opposite lower extremity. The patient did receive antibiotics prior to the incision and was re-dosed during the procedure as needed at indicated intervals. The patient was positioned on the fracture table with the table in traction and internal rotation to reduce the hip. The well leg was placed in a scissor position and all bony prominences were well-padded. The patient had the lower extremity prepped and draped in the standard surgical fashion. The incision was made 4 finger breadths superior to the greater trochanter. A guide pin was inserted into the tip of the greater trochanter under fluoroscopic guidance. An opening reamer was used to gain access to the femoral canal. The nail length was measured and inserted down  the femoral canal to its proper depth. The appropriate version of insertion for the lag screw was found under fluoroscopy. A pin was inserted up the femoral neck through the jig. Then, a second antirotation pin was inserted inferior to the first pin. The length of the lag screw was then measured. The lag screw was inserted as near to center-center in the head as possible. The antirotation pin was then taken out and an interdigitating compression screw was placed in its place. The leg was taken out of traction, then the interdigitating compression screw was used to compress across the fracture. Compression was visualized on serial xrays. A single static distal interlocking screw was placed using the perfect circle technique.  The wound was copiously irrigated with saline and the subcutaneous layer closed with 2.0 vicryl and the skin was reapproximated with staples. The wounds were cleaned and dried a final time and a sterile dressing was placed. The hip was taken through a range of motion at the end of the case under fluoroscopic imaging to visualize the approach-withdraw phenomenon and confirm implant length in the head. The patient was then awakened from anesthesia and taken to the recovery room in stable condition. All counts were correct at the end of the case.   POSTOPERATIVE PLAN: The patient will be weight bearing as tolerated and will return in 2 weeks for staple removal and the patient will receive DVT prophylaxis based on other medications, activity level, and risk ratio of bleeding to thrombosis.   Elijah Reel, MD [facility] [phone] 11:16 AM

## 2018-08-21 NOTE — H&P (Signed)
H&P update  The surgical history has been reviewed and remains accurate without interval change.  The patient was re-examined and patient's physiologic condition has not changed significantly in the last 30 days. The condition still exists that makes this procedure necessary. The treatment plan remains the same, without new options for care.  No new pharmacological allergies or types of therapy has been initiated that would change the plan or the appropriateness of the plan.  The patient and/or family understand the potential benefits and risks.  Mayra Reel, MD 08/21/2018 9:56 AM

## 2018-08-21 NOTE — Progress Notes (Signed)
PROGRESS NOTE    Elijah Saunders  WUJ:[id number] DOB: Sep 19, 1933 DOA: 08/19/2018 PCP: Aida Puffer, MD   Brief Narrative:  Elijah Saunders a 85 y.o.malewith medical history significant ofCHF dt ICM with EF 20-25%, A.Fib, DM2, CKD stage 3. Patient had mechanical fall while trying to get out of chair today. Fell on R hip. Severe pain with movement, unable to bear weight. Ambulance called and pt brought to ED.R hip intertroch fx noted in the ED. Also found to have AKI with creat of 6 and BUN just over 100. Looks like he was 1.3 and 27 respectively back in Nov 2019. Patient has been in his normal state of health over the last few months has had a decreased appetite and some weight loss.EDPspoke to the wife over the phone who states that she was able to witness the fall and the patient has been at his normal mental baseline today. No recent fevers or URI symptoms. No changes in bowel or bladder habits that she is aware of.   Assessment & Plan:   Principal Problem:   Closed displaced intertrochanteric fracture of right femur (HCC) Active Problems:   Chronic systolic CHF (congestive heart failure) (HCC)   Chronic renal disease, stage III (HCC)   Type 2 diabetes mellitus with renal manifestations, controlled (HCC)   Cardiomyopathy, ischemic-EF 35-40%   Persistent atrial fibrillation   Acute kidney failure (HCC)   Closed left hip fracture, initial encounter (HCC)   Closed displaced right intertrochanteric fracture after mechanical fall: Patient seen by orthopedics with plan for operative intervention today.  Pain is been well controlled, was seen by cardiology for preoperative evaluation and is a high risk candidate.  Patient will continue with post hip protocol for PT OT with probable skilled nursing facility placement to obtain pre-admission level of functioning which include ability to perform ADLs.  Continue with pain control and DVT prophylaxis  Acute on chronic kidney disease  stage III: Significant increase in creatinine from baseline of 1.6 in November to 6.1.  Slowly trending down mildly acidotic mildly hypokalemic, will follow creatinine additional day, if no change will consult nephrology  Non-insulin-dependent diabetes type 2 I agree with holding metformin continue sliding scale insulin  Chronic systolic CHF second ischemic cardiomyopathy not in acute exacerbation.  Agree will hold Lasix although be cautious with fluids, continue indoor metoprolol, will add daily weights and strict I's and O's  Chronic A. fib currently rate controlled Eliquis held secondary to operative intervention.  Will restart once cleared by surgery, continue metoprolol amiodarone held by admitting physician will titrate back in likely tomorrow  CAD with history of MI and CABG.  Patient was seen by cardiology, no anginal symptomatology for further management to cards   DVT prophylaxis: SCD/Compression stockings  Code Status: Full code    Code Status Orders  (From admission, onward)         Start     Ordered   08/19/18 2139  Full code  Continuous     08/19/18 2140        Code Status History    Date Active Date Inactive Code Status Order ID Comments User Context   12/19/2016 1122 12/19/2016 1857 Full Code [id number]  Lyn Records, MD Inpatient   09/04/2015 1910 09/08/2015 1932 Full Code [id number]  Eston Esters, MD ED     Family Communication: By phone Disposition Plan:  Patient will require continued inpatient management for postoperative care, currently postoperative day #0 requiring IV pain medications post hip  protocol, PT and OT evaluation and probable skilled nursing facility placement for intensive PT and OT to obtain pre-admission level of functioning. Consults called: None Admission status: Inpatient   Consultants:   Orthopedics, cardiology  Procedures:  Dg Chest 1 View  Result Date: 08/19/2018 CLINICAL DATA:  85 year old male with a history of hip fracture EXAM:  CHEST  1 VIEW COMPARISON:  01/14/2018, 09/04/2015 FINDINGS: Cardiomediastinal silhouette unchanged. Surgical changes of median sternotomy and CABG. Low lung volumes with reticular opacities throughout. No pneumothorax or pleural effusion. Mild fullness in the central vasculature with interlobular septal thickening. No confluent airspace disease. IMPRESSION: Low lung volumes with evidence of edema superimposed on chronic changes. Surgical changes of median sternotomy and CABG Electronically Signed   By: Gilmer Mor D.O.   On: 08/19/2018 21:04   Dg Pelvis 1-2 Views  Result Date: 08/19/2018 CLINICAL DATA:  Fall at home today. Pelvic right hip pain. Initial encounter. EXAM: PELVIS - 1-2 VIEW COMPARISON:  Right hip radiographs also obtained today FINDINGS: There is no evidence of pelvic fracture or diastasis. No pelvic bone lesions are seen. An intertrochanteric right hip fracture is seen. IMPRESSION: No evidence of pelvic fracture. Right hip fracture noted; see separate right hip x-ray report. Electronically Signed   By: Myles Rosenthal M.D.   On: 08/19/2018 20:54   Ct Head Wo Contrast  Result Date: 08/19/2018 CLINICAL DATA:  85 year old male status post fall on concrete patio. Vomiting. EXAM: CT HEAD WITHOUT CONTRAST CT CERVICAL SPINE WITHOUT CONTRAST TECHNIQUE: Multidetector CT imaging of the head and cervical spine was performed following the standard protocol without intravenous contrast. Multiplanar CT image reconstructions of the cervical spine were also generated. COMPARISON:  None. FINDINGS: CT HEAD FINDINGS Brain: Cerebral volume is within normal limits for age. No ventriculomegaly. No midline shift, mass effect, or evidence of intracranial mass lesion. No acute intracranial hemorrhage identified. No cortically based acute infarct identified. Patchy and confluent bilateral cerebral white matter hypodensity. Bilateral basal ganglia lacunar infarcts, appearing more age indeterminate on the right (series 3,  image 20). Small chronic appearing linear right cerebellar infarct on image 9. Vascular: Calcified atherosclerosis at the skull base. No suspicious intracranial vascular hyperdensity. Skull: Osteopenia.  No acute osseous abnormality identified. Sinuses/Orbits: Visualized paranasal sinuses and mastoids are well pneumatized. Other: Calcified scalp vessel atherosclerosis. No scalp hematoma. Negative orbits. CT CERVICAL SPINE FINDINGS Alignment: Preserved cervical lordosis. Cervicothoracic junction alignment is within normal limits. Bilateral posterior element alignment is within normal limits. Skull base and vertebrae: Osteopenia. Visualized skull base is intact. No atlanto-occipital dissociation. No acute osseous abnormality identified. Soft tissues and spinal canal: No prevertebral fluid or swelling. No visible canal hematoma. Bulky calcified carotid atherosclerosis in the neck. Disc levels: Left C7 cervical rib. Degenerative ligamentous hypertrophy about the odontoid. Disc degeneration with vacuum disc at C5-C6. Mild if any degenerative cervical spinal stenosis. Upper chest: Partially visible apically lung scarring. Grossly intact visible upper thoracic levels. IMPRESSION: 1. No acute traumatic injury identified in the head or cervical spine. 2. Small vessel ischemia of the brain with age indeterminate bilateral basal ganglia lacunar infarcts. 3. Calcified atherosclerosis. Electronically Signed   By: Odessa Fleming M.D.   On: 08/19/2018 20:14   Ct Cervical Spine Wo Contrast  Result Date: 08/19/2018 CLINICAL DATA:  85 year old male status post fall on concrete patio. Vomiting. EXAM: CT HEAD WITHOUT CONTRAST CT CERVICAL SPINE WITHOUT CONTRAST TECHNIQUE: Multidetector CT imaging of the head and cervical spine was performed following the standard protocol without intravenous contrast.  Multiplanar CT image reconstructions of the cervical spine were also generated. COMPARISON:  None. FINDINGS: CT HEAD FINDINGS Brain:  Cerebral volume is within normal limits for age. No ventriculomegaly. No midline shift, mass effect, or evidence of intracranial mass lesion. No acute intracranial hemorrhage identified. No cortically based acute infarct identified. Patchy and confluent bilateral cerebral white matter hypodensity. Bilateral basal ganglia lacunar infarcts, appearing more age indeterminate on the right (series 3, image 20). Small chronic appearing linear right cerebellar infarct on image 9. Vascular: Calcified atherosclerosis at the skull base. No suspicious intracranial vascular hyperdensity. Skull: Osteopenia.  No acute osseous abnormality identified. Sinuses/Orbits: Visualized paranasal sinuses and mastoids are well pneumatized. Other: Calcified scalp vessel atherosclerosis. No scalp hematoma. Negative orbits. CT CERVICAL SPINE FINDINGS Alignment: Preserved cervical lordosis. Cervicothoracic junction alignment is within normal limits. Bilateral posterior element alignment is within normal limits. Skull base and vertebrae: Osteopenia. Visualized skull base is intact. No atlanto-occipital dissociation. No acute osseous abnormality identified. Soft tissues and spinal canal: No prevertebral fluid or swelling. No visible canal hematoma. Bulky calcified carotid atherosclerosis in the neck. Disc levels: Left C7 cervical rib. Degenerative ligamentous hypertrophy about the odontoid. Disc degeneration with vacuum disc at C5-C6. Mild if any degenerative cervical spinal stenosis. Upper chest: Partially visible apically lung scarring. Grossly intact visible upper thoracic levels. IMPRESSION: 1. No acute traumatic injury identified in the head or cervical spine. 2. Small vessel ischemia of the brain with age indeterminate bilateral basal ganglia lacunar infarcts. 3. Calcified atherosclerosis. Electronically Signed   By: Odessa Fleming M.D.   On: 08/19/2018 20:14   Dg Knee Complete 4 Views Right  Result Date: 08/19/2018 CLINICAL DATA:  Fall at home  today. Right knee pain. Initial encounter. EXAM: RIGHT KNEE - COMPLETE 4+ VIEW COMPARISON:  None. FINDINGS: No evidence of fracture, dislocation, or joint effusion. Moderate medial compartment osteoarthritis is seen. 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 IMPRESSION: 1. No acute findings. 2. Moderate medial compartment osteoarthritis. Electronically Signed   By: Myles Rosenthal M.D.   On: 08/19/2018 20:56   Dg C-arm 1-60 Min  Result Date: 08/21/2018 CLINICAL DATA:  ORIF right intertrochanteric fracture EXAM: DG C-ARM 61-120 MIN COMPARISON:  None. FLUOROSCOPY TIME:  1 minutes 45 seconds FINDINGS: Right intertrochanteric fracture transfixed with an intramedullary nail and interlocking femoral neck screw. Anatomic alignment. IMPRESSION: Interval ORIF right intertrochanteric fracture. Electronically Signed   By: Elige Ko   On: 08/21/2018 11:35   Ct Renal Stone Study  Result Date: 08/19/2018 CLINICAL DATA:  Acute renal failure. Weight loss. Anorexia. EXAM: CT ABDOMEN AND PELVIS WITHOUT CONTRAST TECHNIQUE: Multidetector CT imaging of the abdomen and pelvis was performed following the standard protocol without IV contrast. COMPARISON:  None. FINDINGS: Lower chest: No acute findings. Hepatobiliary: No mass visualized on this unenhanced exam. Probable tiny gallstones noted, however there is no evidence of cholecystitis or biliary duct dilatation. Pancreas: No mass or inflammatory process visualized on this unenhanced exam. Spleen:  Within normal limits in size. Adrenals/Urinary tract: Normal appearance of both adrenal glands. Left kidney is absent. Right renal vascular calcifications noted, without definite calculi. No evidence of ureteral calculi or hydronephrosis. A few tiny calculi are noted within the urinary bladder. Stomach/Bowel: No evidence of obstruction, inflammatory process, or abnormal fluid collections.  Diverticulosis is seen mainly involving the descending and sigmoid colon, however there is no evidence of diverticulitis. Vascular/Lymphatic: No pathologically enlarged lymph nodes identified. A suprarenal abdominal aortic aneurysm is  seen measuring 4.7 cm in maximum diameter. Reproductive:  No mass or other significant abnormality. Other:  None. Musculoskeletal:  No suspicious bone lesions identified. IMPRESSION: 1. No acute findings. 2. Probable tiny gallstones. No radiographic evidence of cholecystitis. 3. Colonic diverticulosis, without radiographic evidence of cholecystitis. 4. Tiny calculi within urinary bladder. No evidence of ureteral calculi or hydronephrosis. 5. 4.7 cm suprarenal abdominal aortic aneurysm. Recommend followup by abdomen and pelvis CTA in 6 months, and vascular surgery referral/consultation if not already obtained. This recommendation follows ACR consensus guidelines: White Paper of the ACR Incidental Findings Committee II on Vascular Findings. J Am Coll Radiol 2013; 10:789-794. Electronically Signed   By: Myles Rosenthal M.D.   On: 08/19/2018 22:53   Dg Hip Operative Unilat W Or W/o Pelvis Right  Result Date: 08/21/2018 CLINICAL DATA:  ORIF right intertrochanteric fracture EXAM: DG C-ARM 61-120 MIN COMPARISON:  None. FLUOROSCOPY TIME:  1 minutes 45 seconds FINDINGS: Right intertrochanteric fracture transfixed with an intramedullary nail and interlocking femoral neck screw. Anatomic alignment. IMPRESSION: Interval ORIF right intertrochanteric fracture. Electronically Signed   By: Elige Ko   On: 08/21/2018 11:35   Dg Femur Min 2 Views Right  Result Date: 08/19/2018 CLINICAL DATA:  Fall at home today. Right hip and thigh pain. Initial encounter. EXAM: RIGHT FEMUR 2 VIEWS COMPARISON:  None. FINDINGS: A nondisplaced intertrochanteric right hip fracture is seen. No evidence of distal femur fracture. Extensive peripheral vascular calcification noted. IMPRESSION: Nondisplaced  intertrochanteric right hip fracture. Electronically Signed   By: Myles Rosenthal M.D.   On: 08/19/2018 20:55     Antimicrobials:   Perioperative antibiotics per    Subjective: Reported difficulty urinating although Foley was in place, bladder scan showed 200 cc, irrigating.  Objective: Vitals:   08/21/18 1207 08/21/18 1222 08/21/18 1240 08/21/18 1335  BP: 106/64 114/68 114/60 (!) 103/56  Pulse: 78 77 74 80  Resp: 12 12 15 16   Temp:   (!) 97.3 F (36.3 C) (!) 97.4 F (36.3 C)  TempSrc:   Oral Oral  SpO2: 100% 100% 91% 93%  Weight:      Height:        Intake/Output Summary (Last 24 hours) at 08/21/2018 1636 Last data filed at 08/21/2018 1611 Gross per 24 hour  Intake 560 ml  Output 1700 ml  Net -1140 ml   Filed Weights   08/19/18 1904 08/21/18 0915  Weight: 60.3 kg 60.3 kg    Examination:  General exam: Appears calm and comfortable  Respiratory system: Clear to auscultation. Respiratory effort normal. Cardiovascular system: S1 & S2 heard, RRR. No JVD, murmurs, rubs, gallops or clicks. No pedal edema. Gastrointestinal system: Abdomen is nondistended, soft and nontender. No organomegaly or masses felt. Normal bowel sounds heard. Central nervous system: Alert and oriented. No focal neurological deficits. Extremities: Symmetric 5 x 5 power.  Externally rotated neurovascularly intact Skin: No rashes, lesions or ulcers Psychiatry: Judgement and insight appear normal. Mood & affect appropriate.     Data Reviewed: I have personally reviewed following labs and imaging studies  CBC: Recent Labs  Lab 08/19/18 1918  WBC 11.9*  NEUTROABS 9.6*  HGB 10.3*  HCT 31.0*  MCV 93.4  PLT 210   Basic Metabolic Panel: Recent Labs  Lab 08/19/18 1918 08/20/18 0144 08/20/18 1503 08/21/18 0205  NA 135 136 135 133*  K 3.6 3.4* 3.2* 3.3*  CL 100 103 103 101  CO2 22 16* 18* 19*  GLUCOSE 56* 73 110* 109*  BUN 109* 106*  98* 89*  CREATININE 6.18* 6.10* 5.68* 5.34*  CALCIUM 9.2  9.0 8.8* 8.9   GFR: Estimated Creatinine Clearance: 9.1 mL/min (A) (by C-G formula based on SCr of 5.34 mg/dL (H)). Liver Function Tests: Recent Labs  Lab 08/19/18 1918  AST 37  ALT 44  ALKPHOS 74  BILITOT 0.7  PROT 6.9  ALBUMIN 3.3*   No results for input(s): LIPASE, AMYLASE in the last 168 hours. No results for input(s): AMMONIA in the last 168 hours. Coagulation Profile: No results for input(s): INR, PROTIME in the last 168 hours. Cardiac Enzymes: No results for input(s): CKTOTAL, CKMB, CKMBINDEX, TROPONINI in the last 168 hours. BNP (last 3 results) No results for input(s): PROBNP in the last 8760 hours. HbA1C: No results for input(s): HGBA1C in the last 72 hours. CBG: Recent Labs  Lab 08/20/18 1639 08/20/18 2110 08/21/18 0724 08/21/18 0958 08/21/18 1237  GLUCAP 99 71 106* 89 94   Lipid Profile: No results for input(s): CHOL, HDL, LDLCALC, TRIG, CHOLHDL, LDLDIRECT in the last 72 hours. Thyroid Function Tests: No results for input(s): TSH, T4TOTAL, FREET4, T3FREE, THYROIDAB in the last 72 hours. Anemia Panel: No results for input(s): VITAMINB12, FOLATE, FERRITIN, TIBC, IRON, RETICCTPCT in the last 72 hours. Sepsis Labs: No results for input(s): PROCALCITON, LATICACIDVEN in the last 168 hours.  Recent Results (from the past 240 hour(s))  Surgical pcr screen     Status: None   Collection Time: 08/20/18 12:49 AM  Result Value Ref Range Status   MRSA, PCR NEGATIVE NEGATIVE Final   Staphylococcus aureus NEGATIVE NEGATIVE Final    Comment: (NOTE) The Xpert SA Assay (FDA approved for NASAL specimens in patients 7 years of age and older), is one component of a comprehensive surveillance program. It is not intended to diagnose infection nor to guide or monitor treatment. Performed at [facility], [address] [place], [place] [address]   MRSA PCR Screening     Status: None   Collection Time: 08/20/18 11:24 PM  Result Value Ref Range Status   MRSA by  PCR NEGATIVE NEGATIVE Final    Comment:        The GeneXpert MRSA Assay (FDA approved for NASAL specimens only), is one component of a comprehensive MRSA colonization surveillance program. It is not intended to diagnose MRSA infection nor to guide or monitor treatment for MRSA infections. Performed at [facility], [address] [place], [place] [address]          Radiology Studies: Dg Chest 1 View  Result Date: 08/19/2018 CLINICAL DATA:  85 year old male with a history of hip fracture EXAM: CHEST  1 VIEW COMPARISON:  01/14/2018, 09/04/2015 FINDINGS: Cardiomediastinal silhouette unchanged. Surgical changes of median sternotomy and CABG. Low lung volumes with reticular opacities throughout. No pneumothorax or pleural effusion. Mild fullness in the central vasculature with interlobular septal thickening. No confluent airspace disease. IMPRESSION: Low lung volumes with evidence of edema superimposed on chronic changes. Surgical changes of median sternotomy and CABG Electronically Signed   By: Gilmer Mor D.O.   On: 08/19/2018 21:04   Dg Pelvis 1-2 Views  Result Date: 08/19/2018 CLINICAL DATA:  Fall at home today. Pelvic right hip pain. Initial encounter. EXAM: PELVIS - 1-2 VIEW COMPARISON:  Right hip radiographs also obtained today FINDINGS: There is no evidence of pelvic fracture or diastasis. No pelvic bone lesions are seen. An intertrochanteric right hip fracture is seen. IMPRESSION: No evidence of pelvic fracture. Right hip fracture noted; see separate  right hip x-ray report. Electronically Signed   By: Myles Rosenthal M.D.   On: 08/19/2018 20:54   Ct Head Wo Contrast  Result Date: 08/19/2018 CLINICAL DATA:  85 year old male status post fall on concrete patio. Vomiting. EXAM: CT HEAD WITHOUT CONTRAST CT CERVICAL SPINE WITHOUT CONTRAST TECHNIQUE: Multidetector CT imaging of the head and cervical spine was performed following the standard protocol without intravenous contrast.  Multiplanar CT image reconstructions of the cervical spine were also generated. COMPARISON:  None. FINDINGS: CT HEAD FINDINGS Brain: Cerebral volume is within normal limits for age. No ventriculomegaly. No midline shift, mass effect, or evidence of intracranial mass lesion. No acute intracranial hemorrhage identified. No cortically based acute infarct identified. Patchy and confluent bilateral cerebral white matter hypodensity. Bilateral basal ganglia lacunar infarcts, appearing more age indeterminate on the right (series 3, image 20). Small chronic appearing linear right cerebellar infarct on image 9. Vascular: Calcified atherosclerosis at the skull base. No suspicious intracranial vascular hyperdensity. Skull: Osteopenia.  No acute osseous abnormality identified. Sinuses/Orbits: Visualized paranasal sinuses and mastoids are well pneumatized. Other: Calcified scalp vessel atherosclerosis. No scalp hematoma. Negative orbits. CT CERVICAL SPINE FINDINGS Alignment: Preserved cervical lordosis. Cervicothoracic junction alignment is within normal limits. Bilateral posterior element alignment is within normal limits. Skull base and vertebrae: Osteopenia. Visualized skull base is intact. No atlanto-occipital dissociation. No acute osseous abnormality identified. Soft tissues and spinal canal: No prevertebral fluid or swelling. No visible canal hematoma. Bulky calcified carotid atherosclerosis in the neck. Disc levels: Left C7 cervical rib. Degenerative ligamentous hypertrophy about the odontoid. Disc degeneration with vacuum disc at C5-C6. Mild if any degenerative cervical spinal stenosis. Upper chest: Partially visible apically lung scarring. Grossly intact visible upper thoracic levels. IMPRESSION: 1. No acute traumatic injury identified in the head or cervical spine. 2. Small vessel ischemia of the brain with age indeterminate bilateral basal ganglia lacunar infarcts. 3. Calcified atherosclerosis. Electronically Signed    By: Odessa Fleming M.D.   On: 08/19/2018 20:14   Ct Cervical Spine Wo Contrast  Result Date: 08/19/2018 CLINICAL DATA:  85 year old male status post fall on concrete patio. Vomiting. EXAM: CT HEAD WITHOUT CONTRAST CT CERVICAL SPINE WITHOUT CONTRAST TECHNIQUE: Multidetector CT imaging of the head and cervical spine was performed following the standard protocol without intravenous contrast. Multiplanar CT image reconstructions of the cervical spine were also generated. COMPARISON:  None. FINDINGS: CT HEAD FINDINGS Brain: Cerebral volume is within normal limits for age. No ventriculomegaly. No midline shift, mass effect, or evidence of intracranial mass lesion. No acute intracranial hemorrhage identified. No cortically based acute infarct identified. Patchy and confluent bilateral cerebral white matter hypodensity. Bilateral basal ganglia lacunar infarcts, appearing more age indeterminate on the right (series 3, image 20). Small chronic appearing linear right cerebellar infarct on image 9. Vascular: Calcified atherosclerosis at the skull base. No suspicious intracranial vascular hyperdensity. Skull: Osteopenia.  No acute osseous abnormality identified. Sinuses/Orbits: Visualized paranasal sinuses and mastoids are well pneumatized. Other: Calcified scalp vessel atherosclerosis. No scalp hematoma. Negative orbits. CT CERVICAL SPINE FINDINGS Alignment: Preserved cervical lordosis. Cervicothoracic junction alignment is within normal limits. Bilateral posterior element alignment is within normal limits. Skull base and vertebrae: Osteopenia. Visualized skull base is intact. No atlanto-occipital dissociation. No acute osseous abnormality identified. Soft tissues and spinal canal: No prevertebral fluid or swelling. No visible canal hematoma. Bulky calcified carotid atherosclerosis in the neck. Disc levels: Left C7 cervical rib. Degenerative ligamentous hypertrophy about the odontoid. Disc degeneration with vacuum disc at C5-C6.  Mild  if any degenerative cervical spinal stenosis. Upper chest: Partially visible apically lung scarring. Grossly intact visible upper thoracic levels. IMPRESSION: 1. No acute traumatic injury identified in the head or cervical spine. 2. Small vessel ischemia of the brain with age indeterminate bilateral basal ganglia lacunar infarcts. 3. Calcified atherosclerosis. Electronically Signed   By: Odessa Fleming M.D.   On: 08/19/2018 20:14   Dg Knee Complete 4 Views Right  Result Date: 08/19/2018 CLINICAL DATA:  Fall at home today. Right knee pain. Initial encounter. EXAM: RIGHT KNEE - COMPLETE 4+ VIEW COMPARISON:  None. FINDINGS: No evidence of fracture, dislocation, or joint effusion. Moderate medial compartment osteoarthritis is seen. 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 IMPRESSION: 1. No acute findings. 2. Moderate medial compartment osteoarthritis. Electronically Signed   By: Myles Rosenthal M.D.   On: 08/19/2018 20:56   Dg C-arm 1-60 Min  Result Date: 08/21/2018 CLINICAL DATA:  ORIF right intertrochanteric fracture EXAM: DG C-ARM 61-120 MIN COMPARISON:  None. FLUOROSCOPY TIME:  1 minutes 45 seconds FINDINGS: Right intertrochanteric fracture transfixed with an intramedullary nail and interlocking femoral neck screw. Anatomic alignment. IMPRESSION: Interval ORIF right intertrochanteric fracture. Electronically Signed   By: Elige Ko   On: 08/21/2018 11:35   Ct Renal Stone Study  Result Date: 08/19/2018 CLINICAL DATA:  Acute renal failure. Weight loss. Anorexia. EXAM: CT ABDOMEN AND PELVIS WITHOUT CONTRAST TECHNIQUE: Multidetector CT imaging of the abdomen and pelvis was performed following the standard protocol without IV contrast. COMPARISON:  None. FINDINGS: Lower chest: No acute findings. Hepatobiliary: No mass visualized on this unenhanced exam. Probable tiny gallstones noted, however there is no evidence of  cholecystitis or biliary duct dilatation. Pancreas: No mass or inflammatory process visualized on this unenhanced exam. Spleen:  Within normal limits in size. Adrenals/Urinary tract: Normal appearance of both adrenal glands. Left kidney is absent. Right renal vascular calcifications noted, without definite calculi. No evidence of ureteral calculi or hydronephrosis. A few tiny calculi are noted within the urinary bladder. Stomach/Bowel: No evidence of obstruction, inflammatory process, or abnormal fluid collections. Diverticulosis is seen mainly involving the descending and sigmoid colon, however there is no evidence of diverticulitis. Vascular/Lymphatic: No pathologically enlarged lymph nodes identified. A suprarenal abdominal aortic aneurysm is seen measuring 4.7 cm in maximum diameter. Reproductive:  No mass or other significant abnormality. Other:  None. Musculoskeletal:  No suspicious bone lesions identified. IMPRESSION: 1. No acute findings. 2. Probable tiny gallstones. No radiographic evidence of cholecystitis. 3. Colonic diverticulosis, without radiographic evidence of cholecystitis. 4. Tiny calculi within urinary bladder. No evidence of ureteral calculi or hydronephrosis. 5. 4.7 cm suprarenal abdominal aortic aneurysm. Recommend followup by abdomen and pelvis CTA in 6 months, and vascular surgery referral/consultation if not already obtained. This recommendation follows ACR consensus guidelines: White Paper of the ACR Incidental Findings Committee II on Vascular Findings. J Am Coll Radiol 2013; 10:789-794. Electronically Signed   By: Myles Rosenthal M.D.   On: 08/19/2018 22:53   Dg Hip Operative Unilat W Or W/o Pelvis Right  Result Date: 08/21/2018 CLINICAL DATA:  ORIF right intertrochanteric fracture EXAM: DG C-ARM 61-120 MIN COMPARISON:  None. FLUOROSCOPY TIME:  1 minutes 45 seconds FINDINGS: Right intertrochanteric fracture transfixed with an intramedullary nail and interlocking femoral neck screw.  Anatomic alignment. IMPRESSION: Interval ORIF right intertrochanteric fracture. Electronically Signed   By: Elige Ko   On: 08/21/2018 11:35   Dg Femur Min 2 Views Right  Result Date: 08/19/2018  CLINICAL DATA:  Fall at home today. Right hip and thigh pain. Initial encounter. EXAM: RIGHT FEMUR 2 VIEWS COMPARISON:  None. FINDINGS: A nondisplaced intertrochanteric right hip fracture is seen. No evidence of distal femur fracture. Extensive peripheral vascular calcification noted. IMPRESSION: Nondisplaced intertrochanteric right hip fracture. Electronically Signed   By: Myles RosenthalJohn  Stahl M.D.   On: 08/19/2018 20:55        Scheduled Meds:  acetaminophen  500 mg Oral Q6H   [START ON 08/22/2018] apixaban  2.5 mg Oral BID   docusate sodium  100 mg Oral BID   feeding supplement (ENSURE ENLIVE)  237 mL Oral BID BM   HYDROmorphone       insulin aspart  0-9 Units Subcutaneous TID WC   isosorbide mononitrate  30 mg Oral Daily   levothyroxine  100 mcg Oral QAC breakfast   metoprolol tartrate  25 mg Oral BID   multivitamin  1 tablet Oral BID   polyvinyl alcohol  1 drop Both Eyes Daily   Continuous Infusions:  sodium chloride     sodium chloride 75 mL/hr at 08/21/18 1459    ceFAZolin (ANCEF) IV 2 g (08/21/18 1525)   dextrose 70 mL/hr at 08/20/18 1525   methocarbamol (ROBAXIN) IV       LOS: 2 days    Time spent: 2935 MIN    Burke Keelshristopher Lulla [place], MD [facility]  If 7PM-7AM, please contact night-coverage  08/21/2018, 4:36 PM

## 2018-08-21 NOTE — Plan of Care (Signed)
  Problem: Pain Management: Goal: Pain level will decrease Outcome: Progressing   

## 2018-08-21 NOTE — Anesthesia Preprocedure Evaluation (Signed)
Anesthesia Evaluation  Patient identified by MRN, date of birth, ID band Patient awake    Reviewed: Allergy & Precautions, NPO status , Patient's Chart, lab work & pertinent test results, reviewed documented beta blocker date and time   Airway Mallampati: II  TM Distance: >3 FB Neck ROM: Full    Dental no notable dental hx.    Pulmonary shortness of breath, COPD, former smoker,    Pulmonary exam normal breath sounds clear to auscultation       Cardiovascular hypertension, Pt. on medications and Pt. on home beta blockers + CAD, + Past MI and +CHF  Normal cardiovascular exam+ dysrhythmias Atrial Fibrillation  Rhythm:Irregular Rate:Normal     Neuro/Psych    GI/Hepatic   Endo/Other  diabetes, Type 2  Renal/GU      Musculoskeletal   Abdominal   Peds  Hematology   Anesthesia Other Findings   Reproductive/Obstetrics                             Anesthesia Physical  Anesthesia Plan  ASA: III  Anesthesia Plan: General   Post-op Pain Management:    Induction: Intravenous  PONV Risk Score and Plan: 2 and Ondansetron, Midazolam and Treatment may vary due to age or medical condition  Airway Management Planned: Oral ETT  Additional Equipment: Arterial line  Intra-op Plan:   Post-operative Plan: Extubation in OR  Informed Consent: I have reviewed the patients History and Physical, chart, labs and discussed the procedure including the risks, benefits and alternatives for the proposed anesthesia with the patient or authorized representative who has indicated his/her understanding and acceptance.     Dental advisory given  Plan Discussed with: CRNA and Anesthesiologist  Anesthesia Plan Comments:         Anesthesia Quick Evaluation

## 2018-08-21 NOTE — Anesthesia Postprocedure Evaluation (Signed)
Anesthesia Post Note  Patient: Elijah Saunders  Procedure(s) Performed: INTRAMEDULLARY (IM) NAIL INTERTROCHANTRIC (Right Leg Upper)     Patient location during evaluation: PACU Anesthesia Type: General Level of consciousness: awake and alert Pain management: pain level controlled Vital Signs Assessment: post-procedure vital signs reviewed and stable Respiratory status: spontaneous breathing, nonlabored ventilation and respiratory function stable Cardiovascular status: blood pressure returned to baseline and stable Postop Assessment: no apparent nausea or vomiting Anesthetic complications: no    Last Vitals:  Vitals:   08/21/18 1240 08/21/18 1335  BP: 114/60 (!) 103/56  Pulse: 74 80  Resp: 15 16  Temp: (!) 36.3 C (!) 36.3 C  SpO2: 91% 93%    Last Pain:  Vitals:   08/21/18 1335  TempSrc: Oral  PainSc:                  Lowella Curb

## 2018-08-21 NOTE — TOC Initial Note (Signed)
Transition of Care [facility](TOC) - Initial/Assessment Note    Patient Details  Name: Elijah Saunders MRN: [id number] Date of Birth: 08/02/1933  Transition of Care [facility](TOC) CM/SW Contact:    Doy HutchingIsabel H Akelia Husted, LCSWA Phone Number: 08/21/2018, 1:37 PM  Clinical Narrative:                 CSW reached out to pt wife Natalia LeatherwoodKatherine via telephone. Pt only oriented to self after surgery. Introduced self, role, reason for call- pt back on floor and recovering. PT/OT ordered. Pt from home with spouse in [place], their adult daughter Silva BandyKristi lives "basically behind us."  Pt was ambulating without assistance prior to fall but had been eating and drinking less and "moving slower than normal." He had been on porch and sustained a fall. Pt wife states their home is a one level but it does have two steps between the "den and the rest of the home." Pt wife aware therapies ordered and currently does not have an idea of if she is interested in Home Health or SNF at this time- when asked if she felt comfortable [facility] she stated "I just can't be picking him up." CSW will be in touch with pt wife again post therapy, confirmed phone number preferred is one listed on facesheet.   Continuing to follow.  Expected Discharge Plan: Skilled Nursing Facility Barriers to Discharge: Continued Medical Work up, Awaiting State Approval (PASRR)   Patient Goals and CMS Choice Patient states their goals for this hospitalization and ongoing recovery are:: for him to get some rehab (unsure if home or SNF at this time)- pt wife CMS Medicare.gov Compare Post Acute Care list provided to:: Patient Choice offered to / list presented to : Spouse, Patient  Expected Discharge Plan and Services Expected Discharge Plan: Skilled Nursing Facility In-house Referral: Clinical Social Work Discharge Planning Services: CM Consult Post Acute Care Choice: NA(TBD) Living arrangements for the past 2 months: Single Family Home     Prior  Living Arrangements/Services Living arrangements for the past 2 months: Single Family Home Lives with:: Spouse Patient language and need for interpreter reviewed:: Yes(no needs) Do you feel safe going back to the place where you live?: Yes      Need for Family Participation in Patient Care: Yes (Comment)(decision making; ADLs) Care giver support system in place?: Yes (comment)(pt spouse; adult daughter lives behind them)   Criminal Activity/Legal Involvement Pertinent to Current Situation/Hospitalization: No - Comment as needed  Activities of Daily Living Home Assistive Devices/Equipment: Blood pressure cuff, CBG Meter, Eyeglasses, Dentures (specify type), Cane (specify quad or straight) ADL Screening (condition at time of admission) Patient's cognitive ability adequate to safely complete daily activities?: Yes Is the patient deaf or have difficulty hearing?: No Does the patient have difficulty seeing, even when wearing glasses/contacts?: No Does the patient have difficulty concentrating, remembering, or making decisions?: No Patient able to express need for assistance with ADLs?: Yes Does the patient have difficulty dressing or bathing?: No Independently performs ADLs?: Yes (appropriate for developmental age) Does the patient have difficulty walking or climbing stairs?: Yes Weakness of Legs: Right Weakness of Arms/Hands: None  Permission Sought/Granted Permission sought to share information with : Family Supports Permission granted to share information with : No(pt only oriented to self at this time, lives at home with spouse)  Share Information with NAME: Elijah Saunders     Permission granted to share info w Relationship: spouse  Permission granted to share info w  Contact Information: [phone]  Emotional Assessment Appearance:: Other (Comment Required(spoke with wife on phone) Attitude/Demeanor/Rapport: Unable to Assess Affect (typically observed): Unable to  Assess Orientation: : Oriented to Self(previously oriented x4) Alcohol / Substance Use: Not Applicable Psych Involvement: No (comment)  Admission diagnosis:  Fall [W19.XXXA] Closed nondisplaced intertrochanteric fracture of right femur, initial encounter [facility]) [S72.144A] Patient Active Problem List   Diagnosis Date Noted  . Closed displaced intertrochanteric fracture of right femur (HCC) 08/19/2018  . Closed left hip fracture, initial encounter (HCC) 08/19/2018  . Near syncope 12/14/2016  . Orthostatic hypotension 12/30/2015  . Acute kidney failure (HCC) 09/08/2015  . Dyspnea   . Chronic renal disease, stage III (HCC) 09/06/2015  . Type 2 diabetes mellitus with renal manifestations, controlled (HCC) 09/06/2015  . Cardiomyopathy, ischemic-EF 35-40% 09/06/2015  . ACE inhibitor intolerance 09/06/2015  . Persistent atrial fibrillation 09/06/2015  . Emphysema, interstitial (HCC) 09/06/2015  . S/P AAA repair 2003 02/13/2011  . Chronic systolic CHF (congestive heart failure) (HCC) 02/13/2011  . Hx of CABG x 4 2003 08/16/2010  . Hyperlipidemia-statin intol 08/16/2010   PCP:  Aida Puffer, MD Pharmacy:   [facility] - [place] [address] [address] [place] [address] Phone: [phone] Fax: [phone]     Social Determinants of Health (SDOH) Interventions    Readmission Risk Interventions No flowsheet data found.

## 2018-08-22 ENCOUNTER — Encounter (HOSPITAL_COMMUNITY): Payer: Self-pay | Admitting: Orthopaedic Surgery

## 2018-08-22 DIAGNOSIS — E440 Moderate protein-calorie malnutrition: Secondary | ICD-10-CM

## 2018-08-22 LAB — CBC
HCT: 26.3 % — ABNORMAL LOW (ref 39.0–52.0)
Hemoglobin: 8.9 g/dL — ABNORMAL LOW (ref 13.0–17.0)
MCH: 31.3 pg (ref 26.0–34.0)
MCHC: 33.8 g/dL (ref 30.0–36.0)
MCV: 92.6 fL (ref 80.0–100.0)
Platelets: 173 10*3/uL (ref 150–400)
RBC: 2.84 MIL/uL — ABNORMAL LOW (ref 4.22–5.81)
RDW: 18.4 % — ABNORMAL HIGH (ref 11.5–15.5)
WBC: 12 10*3/uL — ABNORMAL HIGH (ref 4.0–10.5)
nRBC: 0.9 % — ABNORMAL HIGH (ref 0.0–0.2)

## 2018-08-22 LAB — BASIC METABOLIC PANEL
Anion gap: 13 (ref 5–15)
BUN: 81 mg/dL — ABNORMAL HIGH (ref 8–23)
CO2: 19 mmol/L — ABNORMAL LOW (ref 22–32)
Calcium: 8.9 mg/dL (ref 8.9–10.3)
Chloride: 100 mmol/L (ref 98–111)
Creatinine, Ser: 4.97 mg/dL — ABNORMAL HIGH (ref 0.61–1.24)
GFR calc Af Amer: 12 mL/min — ABNORMAL LOW (ref >60)
GFR calc non Af Amer: 10 mL/min — ABNORMAL LOW (ref >60)
Glucose, Bld: 167 mg/dL — ABNORMAL HIGH (ref 70–99)
Potassium: 4.1 mmol/L (ref 3.5–5.1)
Sodium: 132 mmol/L — ABNORMAL LOW (ref 135–145)

## 2018-08-22 LAB — GLUCOSE, CAPILLARY
Glucose-Capillary: 103 mg/dL — ABNORMAL HIGH (ref 70–99)
Glucose-Capillary: 179 mg/dL — ABNORMAL HIGH (ref 70–99)
Glucose-Capillary: 196 mg/dL — ABNORMAL HIGH (ref 70–99)
Glucose-Capillary: 125 mg/dL — ABNORMAL HIGH (ref 70–99)

## 2018-08-22 MED ORDER — AMIODARONE HCL 200 MG PO TABS
200.0000 mg | ORAL_TABLET | Freq: Every day | ORAL | Status: DC
  Administered 2018-08-22 – 2018-08-23 (×2): 200 mg via ORAL
  Filled 2018-08-22 (×2): qty 1

## 2018-08-22 MED ORDER — ENSURE ENLIVE PO LIQD
237.0000 mL | Freq: Three times a day (TID) | ORAL | Status: DC
  Administered 2018-08-22 – 2018-08-23 (×3): 237 mL via ORAL

## 2018-08-22 NOTE — Evaluation (Signed)
Physical Therapy Evaluation Patient Details Name: Elijah Saunders MRN: [id number] DOB: Sep 14, 1933 Today's Date: 08/22/2018   History of Present Illness  Pt is an 85 y.o. male admitted 08/19/18 after mechanical fall sustaining R hip intertrochanteric fx. 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 S/p RLE IM nail 4/29. PMH includes CHF, afib, DM2, CKD3.     Clinical Impression  Pt presents with an overall decrease in functional mobility secondary to above. PTA, pt indep, remains active, and lives with wife; has supportive family next door. Today, pt able to initiate transfer and gait training with RW, requiring modA+2. Pt very motivated to participate and regain PLOF. Feel pt would benefit intensive CIR-level therapies to maximize functional mobility and independence prior to return home. Will follow acutely to address established gaosl.     Follow Up Recommendations CIR;Supervision for mobility/OOB    Equipment Recommendations  Rolling walker with 5" wheels    Recommendations for Other Services       Precautions / Restrictions Precautions Precautions: Fall Restrictions Weight Bearing Restrictions: Yes RLE Weight Bearing: Weight bearing as tolerated      Mobility  Bed Mobility Overal bed mobility: Needs Assistance Bed Mobility: Supine to Sit     Supine to sit: Mod assist;+2 for safety/equipment     General bed mobility comments: ModA+2 to assist RLE to EOB and trunk elevation. Once seated, pt able to scoot hips forwad with heavy reliance on BUE support due to R hip pain  Transfers Overall transfer level: Needs assistance Equipment used: Rolling walker (2 wheeled) Transfers: Sit to/from Stand Sit to Stand: Mod assist;+2 physical assistance         General transfer comment: ModA+2 to assist trunk elevation and prevent posterior LOB when transitioning hands to RW; cues for hand placement. MinA for eccentric control into sitting  Ambulation/Gait Ambulation/Gait assistance: Min  assist;+2 safety/equipment Gait Distance (Feet): 2 Feet Assistive device: Rolling walker (2 wheeled) Gait Pattern/deviations: Step-to pattern;Decreased weight shift to right;Antalgic;Trunk flexed Gait velocity: Decreased Gait velocity interpretation: <1.31 ft/sec, indicative of household ambulator General Gait Details: Took pivotal steps from bed to recliner with RW and minA+2 for balance; cues for sequencing with steps and RW  Stairs            Wheelchair Mobility    Modified Rankin (Stroke Patients Only)       Balance Overall balance assessment: Needs assistance   Sitting balance-Leahy Scale: Fair       Standing balance-Leahy Scale: Poor Standing balance comment: Reliant on UE support and external assist                             Pertinent Vitals/Pain Pain Assessment: Faces Faces Pain Scale: Hurts even more Pain Location: RLE Pain Descriptors / Indicators: Guarding;Grimacing Pain Intervention(s): Limited activity within patient's tolerance    Home Living Family/patient expects to be discharged to:: Private residence Living Arrangements: Spouse/significant other Available Help at Discharge: Family;Available 24 hours/day Type of Home: House Home Access: Stairs to enter   [employer] of Steps: 1 Home Layout: One level Home Equipment: None Additional Comments: Lives with wife. Son and grandson live next door    Prior Function Level of Independence: Independent         Comments: Reports indep with ambulation although knees bother him. Remains active, enjoys gardening. Does not wear home O2     Hand Dominance        Extremity/Trunk Assessment  Upper Extremity Assessment Upper Extremity Assessment: Generalized weakness    Lower Extremity Assessment Lower Extremity Assessment: Generalized weakness;RLE deficits/detail RLE Deficits / Details: s/p R hip IM nail RLE: Unable to fully assess due to pain RLE Coordination:  decreased gross motor;decreased fine motor    Cervical / Trunk Assessment Cervical / Trunk Assessment: Kyphotic  Communication   Communication: No difficulties  Cognition Arousal/Alertness: Awake/alert Behavior During Therapy: WFL for tasks assessed/performed Overall Cognitive Status: Within Functional Limits for tasks assessed                                 General Comments: WFL for simple tasks      General Comments General comments (skin integrity, edema, etc.): SpO2 95% on 3L O2 [place]    Exercises     Assessment/Plan    PT Assessment Patient needs continued PT services  PT Problem List Decreased strength;Decreased range of motion;Decreased activity tolerance;Decreased balance;Decreased mobility;Decreased knowledge of use of DME;Decreased knowledge of precautions;Pain       PT Treatment Interventions DME instruction;Gait training;Stair training;Functional mobility training;Therapeutic activities;Therapeutic exercise;Balance training;Patient/family education    PT Goals (Current goals can be found in the Care Plan section)  Acute Rehab PT Goals Patient Stated Goal: "I need to get back to walking and doing for myself" PT Goal Formulation: With patient Time For Goal Achievement: 09/05/18 Potential to Achieve Goals: Good    Frequency Min 5X/week   Barriers to discharge        Co-evaluation PT/OT/SLP Co-Evaluation/Treatment: Yes Reason for Co-Treatment: For patient/therapist safety;To address functional/ADL transfers PT goals addressed during session: Mobility/safety with mobility;Balance;Proper use of DME         AM-PAC PT "6 Clicks" Mobility  Outcome Measure Help needed turning from your back to your side while in a flat bed without using bedrails?: A Lot Help needed moving from lying on your back to sitting on the side of a flat bed without using bedrails?: A Lot Help needed moving to and from a bed to a chair (including a wheelchair)?: A Lot Help  needed standing up from a chair using your arms (e.g., wheelchair or bedside chair)?: A Lot Help needed to walk in hospital room?: A Lot Help needed climbing 3-5 steps with a railing? : Total 6 Click Score: 11    End of Session Equipment Utilized During Treatment: Gait belt;Oxygen Activity Tolerance: Patient tolerated treatment well Patient left: in chair;with call bell/phone within reach;with chair alarm set Nurse Communication: Mobility status PT Visit Diagnosis: Other abnormalities of gait and mobility (R26.89);Muscle weakness (generalized) (M62.81);Pain Pain - Right/Left: Right Pain - part of body: Hip    Time: [id number] PT Time Calculation (min) (ACUTE ONLY): 36 min   Charges:   PT Evaluation $PT Eval Moderate Complexity: 1 Mod        Ina HomesJaclyn Reyne Falconi, PT, DPT Acute Rehabilitation Services  Pager [phone] Office [phone]  Malachy ChamberJaclyn L Itzae Miralles 08/22/2018, 1:54 PM

## 2018-08-22 NOTE — Progress Notes (Signed)
Subjective: 1 Day Post-Op Procedure(s) (LRB): INTRAMEDULLARY (IM) NAIL INTERTROCHANTRIC (Right) Patient reports pain as mild.    Objective: Vital signs in last 24 hours: Temp:  [97.2 F (36.2 C)-97.8 F (36.6 C)] 97.5 F (36.4 C) (04/30 0540) Pulse Rate:  [68-83] 68 (04/30 0540) Resp:  [12-18] 18 (04/30 0540) BP: (94-115)/(55-70) 98/55 (04/30 0540) SpO2:  [91 %-100 %] 100 % (04/30 0540) Weight:  [60.3 kg] 60.3 kg (04/29 0915)  Intake/Output from previous day: 04/29 0701 - 04/30 0700 In: 560 [I.V.:500] Out: 2150 [Urine:2000; Blood:150] Intake/Output this shift: No intake/output data recorded.  Recent Labs    08/19/18 1918 08/22/18 0406  HGB 10.3* 8.9*   Recent Labs    08/19/18 1918 08/22/18 0406  WBC 11.9* 12.0*  RBC 3.32* 2.84*  HCT 31.0* 26.3*  PLT 210 173   Recent Labs    08/21/18 0205 08/22/18 0406  NA 133* 132*  K 3.3* 4.1  CL 101 100  CO2 19* 19*  BUN 89* 81*  CREATININE 5.34* 4.97*  GLUCOSE 109* 167*  CALCIUM 8.9 8.9   No results for input(s): LABPT, INR in the last 72 hours.  Neurologically intact Neurovascular intact Sensation intact distally Intact pulses distally Dorsiflexion/Plantar flexion intact Incision: dressing C/D/I No cellulitis present Compartment soft   Assessment/Plan: 1 Day Post-Op Procedure(s) (LRB): INTRAMEDULLARY (IM) NAIL INTERTROCHANTRIC (Right) Up with therapy  WBAT RLE ABLA- mild and stable Continue plan per medicine team      Cristie Hem 08/22/2018, 7:43 AM

## 2018-08-22 NOTE — Progress Notes (Addendum)
PROGRESS NOTE    Elijah Saunders  ZOX:[id number] DOB: 1933-10-26 DOA: 08/19/2018 PCP: Aida Puffer, MD   Brief Narrative:  Elijah Saunders a 85 y.o.malewith medical history significant ofCHF dt ICM with EF 20-25%, A.Fib, DM2, CKD stage 3. Patient had mechanical fall while trying to get out of chair today. Fell on R hip. Severe pain with movement, unable to bear weight. Ambulance called and pt brought to ED.R hip intertroch fxnoted in the ED. Also found to have AKIwith creat of 6 and BUN just over 100. Looks like he was 1.3 and 27 respectively back in Nov 2019. Patient has been in his normal state of health over the last few months has had a decreased appetite and some weight loss.EDPspoke to the wife over the phone who states that she was able to witness the fall and the patient has been at his normal mental baseline today. No recent fevers or URI symptoms. No changes in bowel or bladder habits that she is aware of.   Assessment & Plan:   Principal Problem:   Closed displaced intertrochanteric fracture of right femur (HCC) Active Problems:   Chronic systolic CHF (congestive heart failure) (HCC)   Chronic renal disease, stage III (HCC)   Type 2 diabetes mellitus with renal manifestations, controlled (HCC)   Cardiomyopathy, ischemic-EF 35-40%   Persistent atrial fibrillation   Acute kidney failure (HCC)   Closed left hip fracture, initial encounter (HCC)   Malnutrition of moderate degree   Closed displaced right intertrochanteric fracture after mechanical fall: Patient seen by orthopedics and is now postop day #1 from IM nailing.  Pain is been well controlled, was seen by cardiology for preoperative evaluation,  Patient will continue with post hip protocol for PT OT with probable skilled nursing facility placement to obtain pre-admission level of functioning which include ability to perform ADLs.  Continue with pain control and DVT prophylaxis-Eliquis restarted today  Mild  hematuria: no pain, likely related to foley insertion in pt with bph, will monitor hgb  Acute on chronic kidney disease stage III: Significant increase in creatinine from baseline of 1.6 in November to 6.1.  Slowly trending down mildly acidotic, will continue to follow creatinine is trending down  6.1>5.7>5.3>4.9  Non-insulin-dependent diabetes type 2 I agree with holding metformin continue sliding scale insulin, monitor blood glucose AC at bedtime  Chronic systolic CHF second ischemic cardiomyopathy not in acute exacerbation.  Agree will hold Lasix although be cautious with fluids, continue indoor metoprolol, will continue daily weights and strict I's and O's  Chronic A. fib currently rate controlled Eliquis restarted continue metoprolol amiodarone held by admitting physician will titrate back in today  CAD with history of MI and CABG.  Patient was seen by cardiology, no anginal symptomatology for further management to cards  DVT prophylaxis: [id number]  Code Status: Full code    Code Status Orders  (From admission, onward)         Start     Ordered   08/19/18 2139  Full code  Continuous     08/19/18 2140        Code Status History    Date Active Date Inactive Code Status Order ID Comments User Context   12/19/2016 1122 12/19/2016 1857 Full Code [id number]  Lyn Records, MD Inpatient   09/04/2015 1910 09/08/2015 1932 Full Code [id number]  Eston Esters, MD ED     Family Communication: Tried calling wife Natalia Leatherwood, unable to get thru Disposition Plan:   Patient remained inpatient  additional day for postoperative treatment to include IV pain management, IV fluids, initiation of PT and OT with postop hip protocol with probable need for skilled nursing facility to reobtain prior level of functioning which includes ability to perform ADLs at home Consults called: None Admission status: Inpatient   Consultants:   Orthopedics, cardiology,  Procedures:  Dg Chest 1 View  Result  Date: 08/19/2018 CLINICAL DATA:  85 year old male with a history of hip fracture EXAM: CHEST  1 VIEW COMPARISON:  01/14/2018, 09/04/2015 FINDINGS: Cardiomediastinal silhouette unchanged. Surgical changes of median sternotomy and CABG. Low lung volumes with reticular opacities throughout. No pneumothorax or pleural effusion. Mild fullness in the central vasculature with interlobular septal thickening. No confluent airspace disease. IMPRESSION: Low lung volumes with evidence of edema superimposed on chronic changes. Surgical changes of median sternotomy and CABG Electronically Signed   By: Gilmer Mor D.O.   On: 08/19/2018 21:04   Dg Pelvis 1-2 Views  Result Date: 08/19/2018 CLINICAL DATA:  Fall at home today. Pelvic right hip pain. Initial encounter. EXAM: PELVIS - 1-2 VIEW COMPARISON:  Right hip radiographs also obtained today FINDINGS: There is no evidence of pelvic fracture or diastasis. No pelvic bone lesions are seen. An intertrochanteric right hip fracture is seen. IMPRESSION: No evidence of pelvic fracture. Right hip fracture noted; see separate right hip x-ray report. Electronically Signed   By: Myles Rosenthal M.D.   On: 08/19/2018 20:54   Ct Head Wo Contrast  Result Date: 08/19/2018 CLINICAL DATA:  85 year old male status post fall on concrete patio. Vomiting. EXAM: CT HEAD WITHOUT CONTRAST CT CERVICAL SPINE WITHOUT CONTRAST TECHNIQUE: Multidetector CT imaging of the head and cervical spine was performed following the standard protocol without intravenous contrast. Multiplanar CT image reconstructions of the cervical spine were also generated. COMPARISON:  None. FINDINGS: CT HEAD FINDINGS Brain: Cerebral volume is within normal limits for age. No ventriculomegaly. No midline shift, mass effect, or evidence of intracranial mass lesion. No acute intracranial hemorrhage identified. No cortically based acute infarct identified. Patchy and confluent bilateral cerebral white matter hypodensity. Bilateral  basal ganglia lacunar infarcts, appearing more age indeterminate on the right (series 3, image 20). Small chronic appearing linear right cerebellar infarct on image 9. Vascular: Calcified atherosclerosis at the skull base. No suspicious intracranial vascular hyperdensity. Skull: Osteopenia.  No acute osseous abnormality identified. Sinuses/Orbits: Visualized paranasal sinuses and mastoids are well pneumatized. Other: Calcified scalp vessel atherosclerosis. No scalp hematoma. Negative orbits. CT CERVICAL SPINE FINDINGS Alignment: Preserved cervical lordosis. Cervicothoracic junction alignment is within normal limits. Bilateral posterior element alignment is within normal limits. Skull base and vertebrae: Osteopenia. Visualized skull base is intact. No atlanto-occipital dissociation. No acute osseous abnormality identified. Soft tissues and spinal canal: No prevertebral fluid or swelling. No visible canal hematoma. Bulky calcified carotid atherosclerosis in the neck. Disc levels: Left C7 cervical rib. Degenerative ligamentous hypertrophy about the odontoid. Disc degeneration with vacuum disc at C5-C6. Mild if any degenerative cervical spinal stenosis. Upper chest: Partially visible apically lung scarring. Grossly intact visible upper thoracic levels. IMPRESSION: 1. No acute traumatic injury identified in the head or cervical spine. 2. Small vessel ischemia of the brain with age indeterminate bilateral basal ganglia lacunar infarcts. 3. Calcified atherosclerosis. Electronically Signed   By: Odessa Fleming M.D.   On: 08/19/2018 20:14   Ct Cervical Spine Wo Contrast  Result Date: 08/19/2018 CLINICAL DATA:  85 year old male status post fall on concrete patio. Vomiting. EXAM: CT HEAD WITHOUT CONTRAST CT CERVICAL SPINE WITHOUT  CONTRAST TECHNIQUE: Multidetector CT imaging of the head and cervical spine was performed following the standard protocol without intravenous contrast. Multiplanar CT image reconstructions of the  cervical spine were also generated. COMPARISON:  None. FINDINGS: CT HEAD FINDINGS Brain: Cerebral volume is within normal limits for age. No ventriculomegaly. No midline shift, mass effect, or evidence of intracranial mass lesion. No acute intracranial hemorrhage identified. No cortically based acute infarct identified. Patchy and confluent bilateral cerebral white matter hypodensity. Bilateral basal ganglia lacunar infarcts, appearing more age indeterminate on the right (series 3, image 20). Small chronic appearing linear right cerebellar infarct on image 9. Vascular: Calcified atherosclerosis at the skull base. No suspicious intracranial vascular hyperdensity. Skull: Osteopenia.  No acute osseous abnormality identified. Sinuses/Orbits: Visualized paranasal sinuses and mastoids are well pneumatized. Other: Calcified scalp vessel atherosclerosis. No scalp hematoma. Negative orbits. CT CERVICAL SPINE FINDINGS Alignment: Preserved cervical lordosis. Cervicothoracic junction alignment is within normal limits. Bilateral posterior element alignment is within normal limits. Skull base and vertebrae: Osteopenia. Visualized skull base is intact. No atlanto-occipital dissociation. No acute osseous abnormality identified. Soft tissues and spinal canal: No prevertebral fluid or swelling. No visible canal hematoma. Bulky calcified carotid atherosclerosis in the neck. Disc levels: Left C7 cervical rib. Degenerative ligamentous hypertrophy about the odontoid. Disc degeneration with vacuum disc at C5-C6. Mild if any degenerative cervical spinal stenosis. Upper chest: Partially visible apically lung scarring. Grossly intact visible upper thoracic levels. IMPRESSION: 1. No acute traumatic injury identified in the head or cervical spine. 2. Small vessel ischemia of the brain with age indeterminate bilateral basal ganglia lacunar infarcts. 3. Calcified atherosclerosis. Electronically Signed   By: Odessa FlemingH  Hall M.D.   On: 08/19/2018 20:14    Dg Knee Complete 4 Views Right  Result Date: 08/19/2018 CLINICAL DATA:  Fall at home today. Right knee pain. Initial encounter. EXAM: RIGHT KNEE - COMPLETE 4+ VIEW COMPARISON:  None. FINDINGS: No evidence of fracture, dislocation, or joint effusion. Moderate medial compartment osteoarthritis is seen. 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 IMPRESSION: 1. No acute findings. 2. Moderate medial compartment osteoarthritis. Electronically Signed   By: Myles RosenthalJohn  Stahl M.D.   On: 08/19/2018 20:56   Dg C-arm 1-60 Min  Result Date: 08/21/2018 CLINICAL DATA:  ORIF right intertrochanteric fracture EXAM: DG C-ARM 61-120 MIN COMPARISON:  None. FLUOROSCOPY TIME:  1 minutes 45 seconds FINDINGS: Right intertrochanteric fracture transfixed with an intramedullary nail and interlocking femoral neck screw. Anatomic alignment. IMPRESSION: Interval ORIF right intertrochanteric fracture. Electronically Signed   By: Elige KoHetal  Patel   On: 08/21/2018 11:35   Ct Renal Stone Study  Result Date: 08/19/2018 CLINICAL DATA:  Acute renal failure. Weight loss. Anorexia. EXAM: CT ABDOMEN AND PELVIS WITHOUT CONTRAST TECHNIQUE: Multidetector CT imaging of the abdomen and pelvis was performed following the standard protocol without IV contrast. COMPARISON:  None. FINDINGS: Lower chest: No acute findings. Hepatobiliary: No mass visualized on this unenhanced exam. Probable tiny gallstones noted, however there is no evidence of cholecystitis or biliary duct dilatation. Pancreas: No mass or inflammatory process visualized on this unenhanced exam. Spleen:  Within normal limits in size. Adrenals/Urinary tract: Normal appearance of both adrenal glands. Left kidney is absent. Right renal vascular calcifications noted, without definite calculi. No evidence of ureteral calculi or hydronephrosis. A few tiny calculi are noted within the urinary bladder.  Stomach/Bowel: No evidence of obstruction, inflammatory process, or abnormal fluid collections. Diverticulosis is seen mainly involving the descending and sigmoid colon,  however there is no evidence of diverticulitis. Vascular/Lymphatic: No pathologically enlarged lymph nodes identified. A suprarenal abdominal aortic aneurysm is seen measuring 4.7 cm in maximum diameter. Reproductive:  No mass or other significant abnormality. Other:  None. Musculoskeletal:  No suspicious bone lesions identified. IMPRESSION: 1. No acute findings. 2. Probable tiny gallstones. No radiographic evidence of cholecystitis. 3. Colonic diverticulosis, without radiographic evidence of cholecystitis. 4. Tiny calculi within urinary bladder. No evidence of ureteral calculi or hydronephrosis. 5. 4.7 cm suprarenal abdominal aortic aneurysm. Recommend followup by abdomen and pelvis CTA in 6 months, and vascular surgery referral/consultation if not already obtained. This recommendation follows ACR consensus guidelines: White Paper of the ACR Incidental Findings Committee II on Vascular Findings. J Am Coll Radiol 2013; 10:789-794. Electronically Signed   By: Myles Rosenthal M.D.   On: 08/19/2018 22:53   Dg Hip Operative Unilat W Or W/o Pelvis Right  Result Date: 08/21/2018 CLINICAL DATA:  ORIF right intertrochanteric fracture EXAM: DG C-ARM 61-120 MIN COMPARISON:  None. FLUOROSCOPY TIME:  1 minutes 45 seconds FINDINGS: Right intertrochanteric fracture transfixed with an intramedullary nail and interlocking femoral neck screw. Anatomic alignment. IMPRESSION: Interval ORIF right intertrochanteric fracture. Electronically Signed   By: Elige Ko   On: 08/21/2018 11:35   Dg Femur Min 2 Views Right  Result Date: 08/19/2018 CLINICAL DATA:  Fall at home today. Right hip and thigh pain. Initial encounter. EXAM: RIGHT FEMUR 2 VIEWS COMPARISON:  None. FINDINGS: A nondisplaced intertrochanteric right hip fracture is seen. No evidence of distal femur  fracture. Extensive peripheral vascular calcification noted. IMPRESSION: Nondisplaced intertrochanteric right hip fracture. Electronically Signed   By: Myles Rosenthal M.D.   On: 08/19/2018 20:55     Antimicrobials:   Perioperative cefazolin   Subjective: Patient reports he feels much better this morning, pain well controlled, no acute events overnight.  Objective: Vitals:   08/21/18 2357 08/22/18 0540 08/22/18 0952 08/22/18 1036  BP: 94/64 (!) 98/55 (!) 95/59 (!) 111/58  Pulse: 83 68 62 (!) 52  Resp: Temp: 97.8 F (36.6 C) (!) 97.5 F (36.4 C)  97.6 F (36.4 C)  TempSrc: Oral Oral  Oral  SpO2: 99% 100%  91%  Weight:      Height:        Intake/Output Summary (Last 24 hours) at 08/22/2018 1410 Last data filed at 08/22/2018 1020 Gross per 24 hour  Intake 360 ml  Output 850 ml  Net -490 ml   Filed Weights   08/19/18 1904 08/21/18 0915  Weight: 60.3 kg 60.3 kg    Examination:  General exam: Appears calm and comfortable  Respiratory system: Clear to auscultation. Respiratory effort normal. Cardiovascular system: S1 & S2 heard, RRR. No JVD, murmurs, rubs, gallops or clicks. No pedal edema. Gastrointestinal system: Abdomen is nondistended, soft and nontender. No organomegaly or masses felt. Normal bowel sounds heard. Central nervous system: Alert and oriented. No focal neurological deficits. Extremities: Symmetric 5 x 5 power with limitation on affected post-operative lower extremity Skin: No rashes, lesions or ulcers Psychiatry: Judgement and insight appear normal. Mood & affect appropriate.     Data Reviewed: I have personally reviewed following labs and imaging studies  CBC: Recent Labs  Lab 08/19/18 1918 08/22/18 0406  WBC 11.9* 12.0*  NEUTROABS 9.6*  --   HGB 10.3* 8.9*  HCT 31.0* 26.3*  MCV 93.4 92.6  PLT 210 173   Basic Metabolic Panel: Recent Labs  Lab 08/19/18 1918 08/20/18 0144 08/20/18 1503  08/21/18 0205 08/22/18 0406  NA 135 136 135  133* 132*  K 3.6 3.4* 3.2* 3.3* 4.1  CL 100 103 103 101 100  CO2 22 16* 18* 19* 19*  GLUCOSE 56* 73 110* 109* 167*  BUN 109* 106* 98* 89* 81*  CREATININE 6.18* 6.10* 5.68* 5.34* 4.97*  CALCIUM 9.2 9.0 8.8* 8.9 8.9   GFR: Estimated Creatinine Clearance: 9.8 mL/min (A) (by C-G formula based on SCr of 4.97 mg/dL (H)). Liver Function Tests: Recent Labs  Lab 08/19/18 1918  AST 37  ALT 44  ALKPHOS 74  BILITOT 0.7  PROT 6.9  ALBUMIN 3.3*   No results for input(s): LIPASE, AMYLASE in the last 168 hours. No results for input(s): AMMONIA in the last 168 hours. Coagulation Profile: No results for input(s): INR, PROTIME in the last 168 hours. Cardiac Enzymes: No results for input(s): CKTOTAL, CKMB, CKMBINDEX, TROPONINI in the last 168 hours. BNP (last 3 results) No results for input(s): PROBNP in the last 8760 hours. HbA1C: No results for input(s): HGBA1C in the last 72 hours. CBG: Recent Labs  Lab 08/21/18 1237 08/21/18 1657 08/21/18 2147 08/22/18 0854 08/22/18 1200  GLUCAP 94 170* 242* 103* 179*   Lipid Profile: No results for input(s): CHOL, HDL, LDLCALC, TRIG, CHOLHDL, LDLDIRECT in the last 72 hours. Thyroid Function Tests: No results for input(s): TSH, T4TOTAL, FREET4, T3FREE, THYROIDAB in the last 72 hours. Anemia Panel: No results for input(s): VITAMINB12, FOLATE, FERRITIN, TIBC, IRON, RETICCTPCT in the last 72 hours. Sepsis Labs: No results for input(s): PROCALCITON, LATICACIDVEN in the last 168 hours.  Recent Results (from the past 240 hour(s))  Surgical pcr screen     Status: None   Collection Time: 08/20/18 12:49 AM  Result Value Ref Range Status   MRSA, PCR NEGATIVE NEGATIVE Final   Staphylococcus aureus NEGATIVE NEGATIVE Final    Comment: (NOTE) The Xpert SA Assay (FDA approved for NASAL specimens in patients 5 years of age and older), is one component of a comprehensive surveillance program. It is not intended to diagnose infection nor to guide or  monitor treatment. Performed at [facility], [address], [place], [place] [address]   MRSA PCR Screening     Status: None   Collection Time: 08/20/18 11:24 PM  Result Value Ref Range Status   MRSA by PCR NEGATIVE NEGATIVE Final    Comment:        The GeneXpert MRSA Assay (FDA approved for NASAL specimens only), is one component of a comprehensive MRSA colonization surveillance program. It is not intended to diagnose MRSA infection nor to guide or monitor treatment for MRSA infections. Performed at [facility], [address], [place], [place] [address]          Radiology Studies: Dg C-arm 1-60 Min  Result Date: 08/21/2018 CLINICAL DATA:  ORIF right intertrochanteric fracture EXAM: DG C-ARM 61-120 MIN COMPARISON:  None. FLUOROSCOPY TIME:  1 minutes 45 seconds FINDINGS: Right intertrochanteric fracture transfixed with an intramedullary nail and interlocking femoral neck screw. Anatomic alignment. IMPRESSION: Interval ORIF right intertrochanteric fracture. Electronically Signed   By: Elige Ko   On: 08/21/2018 11:35   Dg Hip Operative Unilat W Or W/o Pelvis Right  Result Date: 08/21/2018 CLINICAL DATA:  ORIF right intertrochanteric fracture EXAM: DG C-ARM 61-120 MIN COMPARISON:  None. FLUOROSCOPY TIME:  1 minutes 45 seconds FINDINGS: Right intertrochanteric fracture transfixed with an intramedullary nail and interlocking femoral neck screw. Anatomic alignment. IMPRESSION: Interval ORIF right intertrochanteric  fracture. Electronically Signed   By: Elige Ko   On: 08/21/2018 11:35        Scheduled Meds:  amiodarone  200 mg Oral Daily   apixaban  2.5 mg Oral BID   docusate sodium  100 mg Oral BID   feeding supplement (ENSURE ENLIVE)  237 mL Oral TID BM   insulin aspart  0-9 Units Subcutaneous TID WC   isosorbide mononitrate  30 mg Oral Daily   levothyroxine  100 mcg Oral QAC breakfast   metoprolol tartrate  25 mg Oral BID   multivitamin  1  tablet Oral BID   polyvinyl alcohol  1 drop Both Eyes Daily   Continuous Infusions:  sodium chloride     sodium chloride 75 mL/hr at 08/22/18 0949   dextrose 70 mL/hr at 08/20/18 1525   methocarbamol (ROBAXIN) IV       LOS: 3 days    Time spent: 35 minutes    Burke Keels, MD [facility]  If 7PM-7AM, please contact night-coverage  08/22/2018, 2:10 PM

## 2018-08-22 NOTE — Progress Notes (Signed)
Progress Note  Patient Name: Elijah Saunders Date of Encounter: 08/22/2018  Primary Cardiologist: [name], MD   Subjective   Denies chest pain or shortness of breath.  No pain at rest.  Hip pain with any activity or movement.  Inpatient Medications    Scheduled Meds: . acetaminophen  500 mg Oral Q6H  . apixaban  2.5 mg Oral BID  . docusate sodium  100 mg Oral BID  . feeding supplement (ENSURE ENLIVE)  237 mL Oral BID BM  . insulin aspart  0-9 Units Subcutaneous TID WC  . isosorbide mononitrate  30 mg Oral Daily  . levothyroxine  100 mcg Oral QAC breakfast  . metoprolol tartrate  25 mg Oral BID  . multivitamin  1 tablet Oral BID  . polyvinyl alcohol  1 drop Both Eyes Daily   Continuous Infusions: . sodium chloride    . sodium chloride 75 mL/hr at 08/22/18 0949  . dextrose 70 mL/hr at 08/20/18 1525  . methocarbamol (ROBAXIN) IV     PRN Meds: acetaminophen, alum & mag hydroxide-simeth, HYDROcodone-acetaminophen, HYDROcodone-acetaminophen, magnesium citrate, menthol-cetylpyridinium **OR** phenol, methocarbamol **OR** methocarbamol (ROBAXIN) IV, morphine injection, ondansetron **OR** ondansetron (ZOFRAN) IV, polyethylene glycol, sorbitol   Vital Signs    Vitals:   08/21/18 2357 08/22/18 0540 08/22/18 0952 08/22/18 1036  BP: 94/64 (!) 98/55 (!) 95/59 (!) 111/58  Pulse: 83 68 62 (!) 52  Resp: 17 18  16   Temp: 97.8 F (36.6 C) (!) 97.5 F (36.4 C)  97.6 F (36.4 C)  TempSrc: Oral Oral  Oral  SpO2: 99% 100%  91%  Weight:      Height:        Intake/Output Summary (Last 24 hours) at 08/22/2018 1045 Last data filed at 08/22/2018 0620 Gross per 24 hour  Intake 560 ml  Output 1450 ml  Net -890 ml   Last 3 Weights 08/21/2018 08/19/2018 08/08/2018  Weight (lbs) 133 lb 133 lb 133 lb  Weight (kg) 60.328 kg 60.328 kg 60.328 kg      Physical Exam  Elderly male, sitting up in bed, comfortable in no distress GEN: No acute distress.   Neck:  JVP mildly increased  Cardiac:  Irregular, no murmurs, rubs, or gallops.  Respiratory: Clear to auscultation bilaterally. GI: Soft, nontender, non-distended  MS: No edema Neuro:  Nonfocal  Psych: Normal affect   Labs    Chemistry Recent Labs  Lab 08/19/18 1918  08/20/18 1503 08/21/18 0205 08/22/18 0406  NA 135   < > 135 133* 132*  K 3.6   < > 3.2* 3.3* 4.1  CL 100   < > 103 101 100  CO2 22   < > 18* 19* 19*  GLUCOSE 56*   < > 110* 109* 167*  BUN 109*   < > 98* 89* 81*  CREATININE 6.18*   < > 5.68* 5.34* 4.97*  CALCIUM 9.2   < > 8.8* 8.9 8.9  PROT 6.9  --   --   --   --   ALBUMIN 3.3*  --   --   --   --   AST 37  --   --   --   --   ALT 44  --   --   --   --   ALKPHOS 74  --   --   --   --   BILITOT 0.7  --   --   --   --   GFRNONAA 8*   < >  9* 9* 10*  GFRAA 9*   < > 10* 11* 12*  ANIONGAP 13   < > 14 13 13    < > = values in this interval not displayed.     Hematology Recent Labs  Lab 08/19/18 1918 08/22/18 0406  WBC 11.9* 12.0*  RBC 3.32* 2.84*  HGB 10.3* 8.9*  HCT 31.0* 26.3*  MCV 93.4 92.6  MCH 31.0 31.3  MCHC 33.2 33.8  RDW 18.5* 18.4*  PLT 210 173    Cardiac EnzymesNo results for input(s): TROPONINI in the last 168 hours. No results for input(s): TROPIPOC in the last 168 hours.   BNPNo results for input(s): BNP, PROBNP in the last 168 hours.   DDimer No results for input(s): DDIMER in the last 168 hours.   Radiology    Dg C-arm 1-60 Min  Result Date: 08/21/2018 CLINICAL DATA:  ORIF right intertrochanteric fracture EXAM: DG C-ARM 61-120 MIN COMPARISON:  None. FLUOROSCOPY TIME:  1 minutes 45 seconds FINDINGS: Right intertrochanteric fracture transfixed with an intramedullary nail and interlocking femoral neck screw. Anatomic alignment. IMPRESSION: Interval ORIF right intertrochanteric fracture. Electronically Signed   By: Elige KoHetal  Patel   On: 08/21/2018 11:35   Dg Hip Operative Unilat W Or W/o Pelvis Right  Result Date: 08/21/2018 CLINICAL DATA:  ORIF right  intertrochanteric fracture EXAM: DG C-ARM 61-120 MIN COMPARISON:  None. FLUOROSCOPY TIME:  1 minutes 45 seconds FINDINGS: Right intertrochanteric fracture transfixed with an intramedullary nail and interlocking femoral neck screw. Anatomic alignment. IMPRESSION: Interval ORIF right intertrochanteric fracture. Electronically Signed   By: Elige KoHetal  Patel   On: 08/21/2018 11:35     Patient Profile     85 y.o. male with mechanical fall and hip fracture, now postoperative day #1 from surgery.  Multiple cardiac problems include longstanding persistent atrial fibrillation, CAD status post CABG, severe ischemic cardiomyopathy with chronic systolic heart failure, and severe mitral regurgitation.  Assessment & Plan    1.  Coronary artery disease without angina: Patient appears stable and tolerated surgery quite well from a cardiac perspective. 2.  Persistent atrial fibrillation: Heart rate is controlled with metoprolol.  Will resume amiodarone at his home dose for rate control.  Continue apixaban which is been restarted postoperatively. 3.  Chronic systolic heart failure: No evidence of acute volume overload.  Will defer to primary team about restarting Lasix.  He is clearly at risk of volume overload but also has acute renal failure so timing of this should probably wait until his creatinine starts to trend down.  Overall the patient has gotten through surgery without any major cardiac event.  He is back on anticoagulation and rate controlling medications for his atrial fibrillation.  His creatinine is slowly trending down but remains quite elevated at 4.97 mg/dL.  Will defer timing of restarting Lasix to the primary team, possibly in the next day or 2 pending his clinical progress.  He has not been weighed yet today will place an order for daily weights.  Please call if we can be of any further assistance in his care.  [facility] will sign off.   Medication Recommendations:  See above. Restart oral  amiodarone today - order written Other recommendations (labs, testing, etc):  none Follow up as an outpatient:  Will arrange FU with Dr Elease HashimotoNahser  For questions or updates, please contact [facility] Please consult [email or web address] for contact info under     Signed, Tonny BollmanMichael Kaydenn Mclear, MD  08/22/2018, 10:45 AM

## 2018-08-22 NOTE — Progress Notes (Signed)
Nutrition Follow-up  DOCUMENTATION CODES:   Non-severe (moderate) malnutrition in context of chronic illness  INTERVENTION:   -Increase Ensure Enlive po TID, each supplement provides 350 kcal and 20 grams of protein  -Continue MVI with minerals daily -Downgrade diet to dysphagia 2 (mechanical soft) for ease of intake; pt with no teeth  NUTRITION DIAGNOSIS:   Moderate Malnutrition related to chronic illness(CHF) as evidenced by energy intake < or equal to 75% for > or equal to 1 month, mild fat depletion, mild muscle depletion, percent weight loss.  Ongoing  GOAL:   Patient will meet greater than or equal to 90% of their needs  Progressing  MONITOR:   PO intake, Supplement acceptance, Labs, Weight trends, Skin, I & O's  REASON FOR ASSESSMENT:   Consult, Malnutrition Screening Tool Hip fracture protocol  ASSESSMENT:   Elijah Saunders is a 85 y.o. male with medical history significant of CHF dt ICM with EF 20-25%, A.Fib, DM2, CKD stage 3.  4/30- s/p PROCEDURE: Treatment of intertrochanteric fracture with intramedullary implant. CPT [phone]   Reviewed I/O's: -1.6 L x 24 hours and -1.3 L since admission  UOP: 2 L x 24 hours  Spoke with pt at bedside, who reports feeling better today. He shares he has had a poor appetite for approximately 1-2 months. He estimates he typically consumes about one meal per day and recently started consuming 1 chocolate Boost supplement daily, which his daughter recently purchased for him. Observed breakfast meal tray- pt consumed mostly bites of solid food and 50% of coffee and 100% of juice. Pt reports he has had difficulty consuming food off of meal trays, as he does not have access to his dentures and the food textures are often too tough for him. He reports he has been consuming Ensure supplements that have been provided for him.  Reviewed wt hx; noted pt has experienced a 11.2% wt loss over the past 6 months, which is significant for time frame.  Pt unsure of UBW, but reports a generalized decline in wt over the past year. He weighs himself daily due to history of CHF.   Discussed with pt importance of good meal and supplement intake to promote healing. Pt amenable to diet downgrade for ease of intake and continuing Ensure supplements.   Pt with poor oral intake and would benefit from nutrient dense supplement. One Ensure Enlive supplement provides 350 kcals, 20 grams protein, and 44-45 grams of carbohydrate vs one Glucerna shake supplement, which provides 220 kcals, 10 grams of protein, and 26 grams of carbohydrate. Given pt's hx of DM, RD will continue to monitor PO intake, CBGS, and adjust supplement regimen as appropriate.   Labs reviewed: Na: 132 (on IV supplementation), CBGS: 103-242 (inpatient orders for glycemic control 0-9 units insulin aspart TID with meals).   NUTRITION - FOCUSED PHYSICAL EXAM:    Most Recent Value  Orbital Region  Mild depletion  Upper Arm Region  Mild depletion  Thoracic and Lumbar Region  Mild depletion  Buccal Region  Mild depletion  Temple Region  Mild depletion  Clavicle Bone Region  Mild depletion  Clavicle and Acromion Bone Region  Mild depletion  Scapular Bone Region  Mild depletion  Dorsal Hand  Mild depletion  Patellar Region  Mild depletion  Anterior Thigh Region  Mild depletion  Posterior Calf Region  Mild depletion  Edema (RD Assessment)  Mild  Hair  Reviewed  Eyes  Reviewed  Mouth  Reviewed  Skin  Reviewed  Nails  Reviewed       Diet Order:   Diet Order            DIET DYS 2 Room service appropriate? Yes; Fluid consistency: Thin  Diet effective now              EDUCATION NEEDS:   Education needs have been addressed  Skin:  Skin Assessment: Reviewed RN Assessment  Last BM:  08/19/18  Height:   Ht Readings from Last 1 Encounters:  08/21/18 5\' 8"  (1.727 m)    Weight:   Wt Readings from Last 1 Encounters:  08/21/18 60.3 kg    Ideal Body Weight:  70 kg  BMI:   Body mass index is 20.22 kg/m.  Estimated Nutritional Needs:   Kcal:  1600-1800  Protein:  75-90 grams  Fluid:  > 1.6 L    Sindi Beckworth A. Mayford Knife, RD, LDN, CDCES Registered Dietitian II Certified Diabetes Care and Education Specialist Pager: [phone] After hours Pager: [phone]

## 2018-08-22 NOTE — Progress Notes (Signed)
Inpatient Rehabilitation Admissions Coordinator  Inpatient Rehab Consult received. I met with patient at the bedside for rehabilitation assessment. We discussed goals and expectations of an inpatient rehab admission.  I contacted his wife and daughter by phone and discussed their preference for rehab. They prefer an inpt rehab admission. I will begin insurance authorization and follow up tomorrow with possible admit to CIR tomorrow. I have obtained pt's car keys and clothing that he arrived to hospital in. I will meet daughter today and provide these item as as well as obtain necessary items for pt from home that he has requested.  Danne Baxter, RN, MSN Rehab Admissions Coordinator [phone] 08/22/2018 3:27 PM

## 2018-08-22 NOTE — Evaluation (Signed)
Occupational Therapy Evaluation Patient Details Name: Elijah Saunders MRN: [id number] DOB: 09/15/1933 Today's Date: 08/22/2018    History of Present Illness Pt is an 85 y.o. male admitted 08/19/18 after mechanical fall sustaining R hip intertrochanteric fx. 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 S/p RLE IM nail 4/29. PMH includes CHF, afib, DM2, CKD3.    Clinical Impression   Pt admitted s/p fall with R hip fx. Pt currently with functional limitations due to the deficits listed below (see OT Problem List).  Pt will benefit from skilled OT to increase their safety and independence with ADL and functional mobility for ADL to facilitate discharge to venue listed below.   Pt was very I and active prior to fall. Pt would benefit from inpatient rehab to increase I with ADL activity     Follow Up Recommendations  CIR    Equipment Recommendations  None recommended by OT    Recommendations for Other Services       Precautions / Restrictions Precautions Precautions: Fall Restrictions Weight Bearing Restrictions: Yes RLE Weight Bearing: Weight bearing as tolerated      Mobility Bed Mobility Overal bed mobility: Needs Assistance Bed Mobility: Supine to Sit     Supine to sit: Mod assist;+2 for safety/equipment     General bed mobility comments: ModA+2 to assist RLE to EOB and trunk elevation. Once seated, pt able to scoot hips forwad with heavy reliance on BUE support due to R hip pain  Transfers Overall transfer level: Needs assistance Equipment used: Rolling walker (2 wheeled) Transfers: Sit to/from Stand Sit to Stand: Mod assist;+2 physical assistance         General transfer comment: ModA+2 to assist trunk elevation and prevent posterior LOB when transitioning hands to RW; cues for hand placement. MinA for eccentric control into sitting    Balance Overall balance assessment: Needs assistance   Sitting balance-Leahy Scale: Fair       Standing balance-Leahy Scale:  Poor Standing balance comment: Reliant on UE support and external assist                           ADL either performed or assessed with clinical judgement   ADL Overall ADL's : Needs assistance/impaired Eating/Feeding: Set up;Sitting   Grooming: Set up;Sitting   Upper Body Bathing: Minimal assistance;Sitting   Lower Body Bathing: Maximal assistance;Sit to/from stand;Cueing for sequencing;Cueing for safety;+2 for safety/equipment   Upper Body Dressing : Minimal assistance;Sitting   Lower Body Dressing: Maximal assistance;Sit to/from stand;Cueing for sequencing;Cueing for safety;+2 for safety/equipment;+2 for physical assistance                       Vision Patient Visual Report: No change from baseline              Pertinent Vitals/Pain Pain Assessment: Faces Faces Pain Scale: Hurts even more Pain Location: RLE Pain Descriptors / Indicators: Guarding;Grimacing Pain Intervention(s): Limited activity within patient's tolerance     Hand Dominance     Extremity/Trunk Assessment Upper Extremity Assessment Upper Extremity Assessment: Generalized weakness   Lower Extremity Assessment Lower Extremity Assessment: Generalized weakness;RLE deficits/detail RLE Deficits / Details: s/p R hip IM nail RLE: Unable to fully assess due to pain RLE Coordination: decreased gross motor;decreased fine motor   Cervical / Trunk Assessment Cervical / Trunk Assessment: Kyphotic   Communication Communication Communication: No difficulties   Cognition Arousal/Alertness: Awake/alert Behavior During Therapy: WFL for tasks assessed/performed  Overall Cognitive Status: Within Functional Limits for tasks assessed                                 General Comments: WFL for simple tasks   General Comments  SpO2 95% on 3L O2 [place]    Exercises     Shoulder Instructions      Home Living Family/patient expects to be discharged to:: Private residence Living  Arrangements: Spouse/significant other Available Help at Discharge: Family;Available 24 hours/day Type of Home: House Home Access: Stairs to enter [employer] of Steps: 1   Home Layout: One level               Home Equipment: None   Additional Comments: Lives with wife. Son and grandson live next door      Prior Functioning/Environment Level of Independence: Independent        Comments: Reports indep with ambulation although knees bother him. Remains active, enjoys gardening. Does not wear home O2        OT Problem List: Decreased strength;Decreased activity tolerance;Decreased knowledge of precautions;Decreased safety awareness;Impaired balance (sitting and/or standing);Pain      OT Treatment/Interventions: Self-care/ADL training;Patient/family education;Therapeutic activities;DME and/or AE instruction    OT Goals(Current goals can be found in the care plan section) Acute Rehab OT Goals Patient Stated Goal: "I need to get back to walking and doing for myself" OT Goal Formulation: With patient Time For Goal Achievement: 09/05/18 Potential to Achieve Goals: Good  OT Frequency: Min 2X/week   Barriers to Saunders/C:            Co-evaluation   Reason for Co-Treatment: For patient/therapist safety;To address functional/ADL transfers PT goals addressed during session: Mobility/safety with mobility;Balance;Proper use of DME        AM-PAC OT "6 Clicks" Daily Activity     Outcome Measure Help from another person eating meals?: None Help from another person taking care of personal grooming?: None Help from another person toileting, which includes using toliet, bedpan, or urinal?: A Lot Help from another person bathing (including washing, rinsing, drying)?: A Lot   Help from another person to put on and taking off regular lower body clothing?: A Lot 6 Click Score: 14   End of Session Equipment Utilized During Treatment: Rolling walker;Gait belt Nurse  Communication: Mobility status  Activity Tolerance: Patient tolerated treatment well Patient left: in chair;with call bell/phone within reach  OT Visit Diagnosis: Unsteadiness on feet (R26.81);Muscle weakness (generalized) (M62.81);Pain;Other abnormalities of gait and mobility (R26.89);History of falling (Z91.81)                Time: 1315-1400 OT Time Calculation (min): 45 min Charges:  OT General Charges $OT Visit: 1 Visit OT Evaluation $OT Eval Moderate Complexity: 1 Mod OT Treatments $Self Care/Home Management : 8-22 mins  Lise Auer, OT Acute Rehabilitation Services [phone] Office- [phone]     Elijah Saunders, Elijah Saunders 08/22/2018, 4:42 PM

## 2018-08-22 NOTE — Progress Notes (Signed)
Nursing Note: Took patients gold wedding band down to security and had it locked up. Engineer, materials DC.

## 2018-08-22 NOTE — H&P (Signed)
Physical Medicine and Rehabilitation Admission H&P    Chief Complaint  Patient presents with   Fall  Chief complaint: Hip pain  HPI: Elijah Saunders is a 85 year old right-handed male with history of diastolic congestive heart failure with ejection fraction of 40%, AAA status post repair , CAD with CABG 2003, hypertension, hyperlipidemia, atrial fibrillation maintained on amiodarone as well as Eliquis, CKD stage III with left nephrectomy 1955 and type 2 diabetes mellitus.  Per chart review patient lives with spouse.  Independent with ambulation prior to admission and active.  One level home with one-step to entry.  Son and grandson also lives next door can assist as needed.  Presented 08/19/2018 after mechanical fall while trying to get out of a chair.  No loss of consciousness.  Cranial CT scan as well as CT cervical spine negative for acute findings.  There was noted small vessel ischemia of the brain with age-indeterminate bilateral basal ganglia lacunar infarcts.  X-rays and imaging revealed a nondisplaced intertrochanteric right hip fracture.  Noted elevated creatinine 6.18 with BUN 109 from baseline 1.63.  CT renal stone study negative for acute findings.  No evidence of ureteral calculi or hydronephrosis.  4.7 suprarenal abdominal aortic aneurysm as also noted 2017 with recommendations of pelvis CTA 6 months and vascular surgery follow-up.  Cardiology consulted in regards to atrial fibrillation and Eliquis was held.  Patient was cleared for surgery and underwent IM nailing 08/21/2018 per Dr.Xu.  Hospital course pain management weightbearing as tolerated.  Patient was cleared to resume Eliquis 08/22/2018.  Acute blood loss anemia 8.0.  Creatinine slowly improving 4.75 with monitoring of hydration.  Patient's home Lasix had been held due to elevated creatinine with strict INO's.  Patient's metformin placed on hold due to elevated creatinine.  Therapy evaluations completed and patient was admitted for  a comprehensive rehab program.   Review of Systems  Constitutional: Negative for chills and fever.  HENT: Negative for hearing loss.   Eyes: Negative for blurred vision and double vision.  Respiratory: Negative for cough and shortness of breath.   Cardiovascular: Positive for palpitations and leg swelling. Negative for chest pain.  Gastrointestinal: Positive for constipation. Negative for heartburn, nausea and vomiting.  Genitourinary: Positive for urgency. Negative for dysuria, flank pain and hematuria.  Musculoskeletal: Positive for joint pain and myalgias.  Skin: Negative for rash.  Neurological: Negative for seizures.  All other systems reviewed and are negative.  Past Medical History:  Diagnosis Date   AAA (abdominal aortic aneurysm) (HCC) 2003   s/p AAA R&G June 2003   CAD (coronary artery disease) 2003   s/p CABG, 2003    CHF (congestive heart failure) (HCC)    EF 35-40% by echo, May 2017   Chronic renal insufficiency, stage III (moderate) (HCC)    HTN (hypertension)    Hypercholesteremia    statin intol   MI (myocardial infarction) (HCC)    SBO (small bowel obstruction) [facility]) Nov 2002   Past Surgical History:  Procedure Laterality Date   ABDOMINAL AORTIC ANEURYSM REPAIR  June 2003   CARDIOVERSION N/A 09/08/2015   Procedure: CARDIOVERSION;  Surgeon: Laurey Morale, MD;  Location: [facility] ENDOSCOPY;  Service: Cardiovascular;  Laterality: N/A;   CARDIOVERSION N/A 03/20/2018   Procedure: CARDIOVERSION;  Surgeon: Vesta Mixer, MD;  Location: [facility] ENDOSCOPY;  Service: Cardiovascular;  Laterality: N/A;   CORONARY ARTERY BYPASS GRAFT  March 2003   x 4   HERNIA REPAIR     INTRAMEDULLARY (IM)  NAIL INTERTROCHANTERIC Right 08/21/2018   Procedure: INTRAMEDULLARY (IM) NAIL INTERTROCHANTRIC;  Surgeon: Tarry KosXu, Naiping M, MD;  Location: [facility] OR;  Service: Orthopedics;  Laterality: Right;   NEPHRECTOMY Left    1955   RIGHT/LEFT HEART CATH AND CORONARY/GRAFT ANGIOGRAPHY N/A  12/19/2016   Procedure: RIGHT/LEFT HEART CATH AND CORONARY/GRAFT ANGIOGRAPHY;  Surgeon: [name], [name], MD;  Location: [facility] INVASIVE CV LAB;  Service: Cardiovascular;  Laterality: N/A;   TEE WITHOUT CARDIOVERSION N/A 09/08/2015   Procedure: TRANSESOPHAGEAL ECHOCARDIOGRAM (TEE);  Surgeon: [name], MD;  [facility] END[facility]COPY;  Service: Cardiovascular;  Laterality: N/A;   Family History  Problem Relation Age of Onset   Alzheimer's disease Mother    Diabetes Mother    Gallbladder disease Mother    Cancer - Cervical Mother    Coronary artery disease Father    Diabetes Father    Social History:  reports that he quit smoking about 23 years ago. His smoking use included cigarettes. He has a 40.00 pack-year smoking history. He has never used smokeless tobacco. He reports that he does not drink alcohol or use drugs. Allergies:  Allergies  Allergen Reactions   Ace Inhibitors Other (See Comments)    Renal insuffiencey   Atorvastatin Other (See Comments)    Intolerant to many statins, muscle pain   Crestor [Rosuvastatin Calcium] Other (See Comments)    Muscle pain   Zetia [Ezetimibe] Other (See Comments)    Leg cramps   Lopid [Gemfibrozil]     Unknown reaction   Penicillins Other (See Comments)    UNSPECIFIED REACTION  Has patient had a PCN reaction causing immediate rash, facial/tongue/throat swelling, SOB or lightheadedness with hypotension: unknown Has patient had a PCN reaction causing severe rash involving mucus membranes or skin necrosis: unknown Has patient had a PCN reaction that required hospitalization: unknown Has patient had a PCN reaction occurring within the last 10 years: no  If all of the above answers are "NO", then may proceed with Cephalosporin use.    Medications Prior to Admission  Medication Sig Dispense Refill   acetaminophen (TYLENOL) 325 MG tablet Take 650 mg by mouth every 6 (six) hours as needed for headache (pain).     allopurinol (ZYLOPRIM)  300 MG tablet Take 300 mg by mouth daily.     amiodarone (PACERONE) 200 MG tablet Take 1 tablet (200 mg total) by mouth daily. 60 tablet 11   cyanocobalamin 500 MCG tablet Take 500 mcg by mouth at bedtime. Vitamin b12     ELIQUIS 2.5 MG TABS tablet TAKE 1 TABLET BY MOUTH TWICE DAILY (Patient taking differently: Take 2.5 mg by mouth 2 (two) times daily. ) 180 tablet 2   furosemide (LASIX) 40 MG tablet Take 1 tablet (40 mg total) by mouth 2 (two) times daily. (Patient taking differently: Take 40 mg by mouth 2 (two) times daily with breakfast and lunch. ) 90 tablet 3   glimepiride (AMARYL) 1 MG tablet Take 1 mg by mouth daily with lunch.      isosorbide mononitrate (IMDUR) 30 MG 24 hr tablet TAKE 1 TABLET BY MOUTH DAILY (Patient taking differently: Take 30 mg by mouth daily with lunch. ) 30 tablet 1   levothyroxine (SYNTHROID, LEVOTHROID) 100 MCG tablet Take 100 mcg by mouth daily before breakfast.     Menthol, Topical Analgesic, (BLUE-EMU MAXIMUM STRENGTH EX) Apply 1 application topically 2 (two) times a day. For knee pain/ sore muscles     metFORMIN (GLUCOPHAGE) 500 MG tablet Take 500 mg  by mouth 2 (two) times daily.      metoprolol tartrate (LOPRESSOR) 25 MG tablet Take 1 tablet (25 mg total) by mouth 2 (two) times daily. 60 tablet 11   Multiple Vitamins-Minerals (PRESERVISION AREDS 2) CAPS Take 1 capsule by mouth 2 (two) times daily.     Polyethyl Glycol-Propyl Glycol (SYSTANE ULTRA) 0.4-0.3 % SOLN Place 1 drop into both eyes daily.     Vitamin D, Ergocalciferol, (DRISDOL) 50000 units CAPS capsule Take 50,000 Units by mouth every Wednesday.     ACCU-CHEK SMARTVIEW test strip Use as directed to test blood glucose each morning.      Drug Regimen Review Drug regimen was reviewed and remains appropriate with no significant issues identified  Home: Home Living Family/patient expects to be discharged to:: Private residence Living Arrangements: Spouse/significant other Available Help  at Discharge: Family, Available 24 hours/day Type of Home: House Home Access: Stairs to enter [employer] of Steps: 1 Home Layout: One level Home Equipment: None Additional Comments: Lives with wife. Son and grandson live next door   Functional History: Prior Function Level of Independence: Independent Comments: Reports indep with ambulation although knees bother him. Remains active, enjoys gardening. Does not wear home O2  Functional Status:  Mobility: Bed Mobility Overal bed mobility: Needs Assistance Bed Mobility: Supine to Sit Supine to sit: Mod assist, +2 for safety/equipment General bed mobility comments: ModA+2 to assist RLE to EOB and trunk elevation. Once seated, pt able to scoot hips forwad with heavy reliance on BUE support due to R hip pain Transfers Overall transfer level: Needs assistance Equipment used: Rolling walker (2 wheeled) Transfers: Sit to/from Stand Sit to Stand: Mod assist, +2 physical assistance General transfer comment: ModA+2 to assist trunk elevation and prevent posterior LOB when transitioning hands to RW; cues for hand placement. MinA for eccentric control into sitting Ambulation/Gait Ambulation/Gait assistance: Min assist, +2 safety/equipment Gait Distance (Feet): 2 Feet Assistive device: Rolling walker (2 wheeled) Gait Pattern/deviations: Step-to pattern, Decreased weight shift to right, Antalgic, Trunk flexed General Gait Details: Took pivotal steps from bed to recliner with RW and minA+2 for balance; cues for sequencing with steps and RW Gait velocity: Decreased Gait velocity interpretation: <1.31 ft/sec, indicative of household ambulator    ADL: ADL Overall ADL's : Needs assistance/impaired Eating/Feeding: Set up, Sitting Grooming: Set up, Sitting Upper Body Bathing: Minimal assistance, Sitting Lower Body Bathing: Maximal assistance, Sit to/from stand, Cueing for sequencing, Cueing for safety, +2 for safety/equipment Upper Body  Dressing : Minimal assistance, Sitting Lower Body Dressing: Maximal assistance, Sit to/from stand, Cueing for sequencing, Cueing for safety, +2 for safety/equipment, +2 for physical assistance  Cognition: Cognition Overall Cognitive Status: Within Functional Limits for tasks assessed Orientation Level: Oriented X4 Cognition Arousal/Alertness: Awake/alert Behavior During Therapy: WFL for tasks assessed/performed Overall Cognitive Status: Within Functional Limits for tasks assessed General Comments: WFL for simple tasks  Physical Exam: Blood pressure 97/71, pulse 72, temperature 97.7 F (36.5 C), temperature source Oral, resp. rate 16, height  (1.727 m), weight 60.5 kg, SpO2 95 %. Physical Exam  Constitutional: No distress.  HENT:  Head: Normocephalic and atraumatic.  Eyes: Pupils are equal, round, and reactive to light. EOM are normal.  Neck: Normal range of motion. No thyromegaly present.  Cardiovascular: Exam reveals no friction rub.  No murmur heard. IRR IRR  Respiratory: Effort normal. No respiratory distress. He has no wheezes.  GI: Soft. He exhibits no distension. There is no abdominal tenderness.  Musculoskeletal:  Comments: Right thigh tender to palpation with associated edema  Neurological:  Patient is is a bit lethargic but makes good eye contact with examiner and follows commands.  Provides his name and age.  Follows commands. UE 4/5. RLE 2-/5 HF, KE limited by pain, ADF/PF 4/5. LLE 3-4/5 prox to distal. No sensory findings  Skin: He is not diaphoretic.  Hip incision clean and dry with some bruising, mild s/s drainage.   Psychiatric:  Pleasant, slightly confused    Results for orders placed or performed during the hospital encounter of 08/19/18 (from the past 48 hour(s))  Glucose, capillary     Status: Abnormal   Collection Time: 08/21/18  7:24 AM  Result Value Ref Range   Glucose-Capillary 106 (H) 70 - 99 mg/dL  Glucose, capillary     Status: None    Collection Time: 08/21/18  9:58 AM  Result Value Ref Range   Glucose-Capillary 89 70 - 99 mg/dL  Glucose, capillary     Status: None   Collection Time: 08/21/18 12:37 PM  Result Value Ref Range   Glucose-Capillary 94 70 - 99 mg/dL  Glucose, capillary     Status: Abnormal   Collection Time: 08/21/18  4:57 PM  Result Value Ref Range   Glucose-Capillary 170 (H) 70 - 99 mg/dL  Glucose, capillary     Status: Abnormal   Collection Time: 08/21/18  9:47 PM  Result Value Ref Range   Glucose-Capillary 242 (H) 70 - 99 mg/dL   Comment 1 Notify RN    Comment 2 Document in Chart   CBC     Status: Abnormal   Collection Time: 08/22/18  4:06 AM  Result Value Ref Range   WBC 12.0 (H) 4.0 - 10.5 K/uL   RBC 2.84 (L) 4.22 - 5.81 MIL/uL   Hemoglobin 8.9 (L) 13.0 - 17.0 g/dL   HCT 57.2 (L) 62.0 - 35.5 %   MCV 92.6 80.0 - 100.0 fL   MCH 31.3 26.0 - 34.0 pg   MCHC 33.8 30.0 - 36.0 g/dL   RDW 97.4 (H) 16.3 - 84.5 %   Platelets 173 150 - 400 K/uL   nRBC 0.9 (H) 0.0 - 0.2 %    Comment: Performed at [facility], [address], [place], [place] [address]  Basic metabolic panel     Status: Abnormal   Collection Time: 08/22/18  4:06 AM  Result Value Ref Range   Sodium 132 (L) 135 - 145 mmol/L   Potassium 4.1 3.5 - 5.1 mmol/L    Comment: DELTA CHECK NOTED   Chloride 100 98 - 111 mmol/L   CO2 19 (L) 22 - 32 mmol/L   Glucose, Bld 167 (H) 70 - 99 mg/dL   BUN 81 (H) 8 - 23 mg/dL   Creatinine, Ser 0.32 (H) 0.61 - 1.24 mg/dL   Calcium 8.9 8.9 - 12.2 mg/dL   GFR calc non Af Amer 10 (L) >60 mL/min   GFR calc Af Amer 12 (L) >60 mL/min   Anion gap 13 5 - 15    Comment: Performed at [facility], [address], [place], [place] [address]  Glucose, capillary     Status: Abnormal   Collection Time: 08/22/18  8:54 AM  Result Value Ref Range   Glucose-Capillary 103 (H) 70 - 99 mg/dL  Glucose, capillary     Status: Abnormal   Collection Time: 08/22/18 12:00 PM  Result Value Ref Range    Glucose-Capillary 179 (H) 70 -  99 mg/dL  Glucose, capillary     Status: Abnormal   Collection Time: 08/22/18  5:00 PM  Result Value Ref Range   Glucose-Capillary 196 (H) 70 - 99 mg/dL  Glucose, capillary     Status: Abnormal   Collection Time: 08/22/18  9:39 PM  Result Value Ref Range   Glucose-Capillary 125 (H) 70 - 99 mg/dL  CBC     Status: Abnormal   Collection Time: 09/14/2018  1:48 AM  Result Value Ref Range   WBC 13.9 (H) 4.0 - 10.5 K/uL   RBC 2.56 (L) 4.22 - 5.81 MIL/uL   Hemoglobin 8.0 (L) 13.0 - 17.0 g/dL   HCT 16.1 (L) 09.6 - 04.5 %   MCV 92.2 80.0 - 100.0 fL   MCH 31.3 26.0 - 34.0 pg   MCHC 33.9 30.0 - 36.0 g/dL   RDW 40.9 (H) 81.1 - 91.4 %   Platelets 158 150 - 400 K/uL   nRBC 0.6 (H) 0.0 - 0.2 %    Comment: Performed at [facility], [address], [place], [place] [address]  Basic metabolic panel     Status: Abnormal   Collection Time: 09/21/2018  1:48 AM  Result Value Ref Range   Sodium 133 (L) 135 - 145 mmol/L   Potassium 3.9 3.5 - 5.1 mmol/L   Chloride 103 98 - 111 mmol/L   CO2 17 (L) 22 - 32 mmol/L   Glucose, Bld 95 70 - 99 mg/dL   BUN 81 (H) 8 - 23 mg/dL   Creatinine, Ser 6.21 (H) 0.61 - 1.24 mg/dL   Calcium 8.6 (L) 8.9 - 10.3 mg/dL   GFR calc non Af Amer 11 (L) >60 mL/min   GFR calc Af Amer 12 (L) >60 mL/min   Anion gap 13 5 - 15    Comment: Performed at [facility], [address] [place], [place] [address]   Dg C-arm 1-60 Min  Result Date: 08/21/2018 CLINICAL DATA:  ORIF right intertrochanteric fracture EXAM: DG C-ARM 61-120 MIN COMPARISON:  None. FLUOROSCOPY TIME:  1 minutes 45 seconds FINDINGS: Right intertrochanteric fracture transfixed with an intramedullary nail and interlocking femoral neck screw. Anatomic alignment. IMPRESSION: Interval ORIF right intertrochanteric fracture. Electronically Signed   By: Elige Ko   On: 08/21/2018 11:35   Dg Hip Operative Unilat W Or W/o Pelvis Right  Result Date: 08/21/2018 CLINICAL DATA:  ORIF  right intertrochanteric fracture EXAM: DG C-ARM 61-120 MIN COMPARISON:  None. FLUOROSCOPY TIME:  1 minutes 45 seconds FINDINGS: Right intertrochanteric fracture transfixed with an intramedullary nail and interlocking femoral neck screw. Anatomic alignment. IMPRESSION: Interval ORIF right intertrochanteric fracture. Electronically Signed   By: Elige Ko   On: 08/21/2018 11:35       Medical Problem List and Plan: 1.  Decreased functional mobility secondary to right hip intertrochanteric hip fracture.  Status post IM nailing 08/21/2018.  Weightbearing as tolerated  -admit to inpatient rehab 2.  Antithrombotics: -DVT/anticoagulation: Eliquis  -antiplatelet therapy: N/A 3. Pain Management: Hydrocodone and Robaxin as needed  -limit narcotics as possible due to neurosedation 4. Mood: Provide emotional support  -antipsychotic agents: N/A 5. Neuropsych: This patient is capable of making decisions on his own behalf. 6. Skin/Wound Care: Routine skin checks 7. Fluids/Electrolytes/Nutrition: Routine in and outs with follow-up chemistries  -encourage PO 8.  Acute blood loss anemia.  Follow-up CBC with admit labs 9.  Acute on chronic CKD stage III.  Baseline creatinine 1.63.  Slowly trending down.   -  Follow-up chemistries on Monday 10.  Chronic diastolic congestive heart failure.  Monitor for any signs of fluid overload.  Lasix 40 mg twice daily currently on hold due to elevated creatinine  -check daily weights 11.  Chronic atrial fibrillation.   Lopressor 25 mg p.o. twice daily, amiodarone 200 mg daily.  Cardiac rate controlled.  Follow cardiology services 12.  CAD with CABG.  No chest pain or shortness of breath.  Continue Imdur 13.  History of AAA with repair.  Follow-up CTA 6 months 14.  Diabetes mellitus.  Latest hemoglobin A1c 6.3.  Glucophage held due to elevated creatinine.  Patient on Amaryl 1 mg daily, Glucophage 500 mg twice daily prior to admission  -monitor for glucose patterns, adjust  regimen as needed 15.  Hypothyroidism.  Synthroid 16.  Constipation.  Laxative assistance    Charlton Amor, PA-C 09/20/2018

## 2018-08-23 ENCOUNTER — Inpatient Hospital Stay (HOSPITAL_COMMUNITY)
Admission: RE | Admit: 2018-08-23 | Discharge: 2018-09-23 | DRG: 560 | Disposition: E | Payer: Medicare Other | Source: Intra-hospital | Attending: Physical Medicine & Rehabilitation | Admitting: Physical Medicine & Rehabilitation

## 2018-08-23 ENCOUNTER — Other Ambulatory Visit: Payer: Self-pay

## 2018-08-23 ENCOUNTER — Encounter (HOSPITAL_COMMUNITY): Payer: Self-pay | Admitting: Nurse Practitioner

## 2018-08-23 DIAGNOSIS — D62 Acute posthemorrhagic anemia: Secondary | ICD-10-CM | POA: Diagnosis present

## 2018-08-23 DIAGNOSIS — Z8249 Family history of ischemic heart disease and other diseases of the circulatory system: Secondary | ICD-10-CM | POA: Diagnosis not present

## 2018-08-23 DIAGNOSIS — E039 Hypothyroidism, unspecified: Secondary | ICD-10-CM | POA: Diagnosis present

## 2018-08-23 DIAGNOSIS — E785 Hyperlipidemia, unspecified: Secondary | ICD-10-CM | POA: Diagnosis present

## 2018-08-23 DIAGNOSIS — Z888 Allergy status to other drugs, medicaments and biological substances status: Secondary | ICD-10-CM

## 2018-08-23 DIAGNOSIS — S72009A Fracture of unspecified part of neck of unspecified femur, initial encounter for closed fracture: Secondary | ICD-10-CM | POA: Diagnosis present

## 2018-08-23 DIAGNOSIS — E1122 Type 2 diabetes mellitus with diabetic chronic kidney disease: Secondary | ICD-10-CM | POA: Diagnosis present

## 2018-08-23 DIAGNOSIS — R319 Hematuria, unspecified: Secondary | ICD-10-CM | POA: Diagnosis present

## 2018-08-23 DIAGNOSIS — I2510 Atherosclerotic heart disease of native coronary artery without angina pectoris: Secondary | ICD-10-CM | POA: Diagnosis present

## 2018-08-23 DIAGNOSIS — K5900 Constipation, unspecified: Secondary | ICD-10-CM | POA: Diagnosis present

## 2018-08-23 DIAGNOSIS — Z79890 Hormone replacement therapy (postmenopausal): Secondary | ICD-10-CM | POA: Diagnosis not present

## 2018-08-23 DIAGNOSIS — I482 Chronic atrial fibrillation, unspecified: Secondary | ICD-10-CM | POA: Diagnosis present

## 2018-08-23 DIAGNOSIS — Z8679 Personal history of other diseases of the circulatory system: Secondary | ICD-10-CM | POA: Diagnosis not present

## 2018-08-23 DIAGNOSIS — I130 Hypertensive heart and chronic kidney disease with heart failure and stage 1 through stage 4 chronic kidney disease, or unspecified chronic kidney disease: Secondary | ICD-10-CM | POA: Diagnosis present

## 2018-08-23 DIAGNOSIS — E119 Type 2 diabetes mellitus without complications: Secondary | ICD-10-CM

## 2018-08-23 DIAGNOSIS — N179 Acute kidney failure, unspecified: Secondary | ICD-10-CM | POA: Diagnosis present

## 2018-08-23 DIAGNOSIS — W1830XD Fall on same level, unspecified, subsequent encounter: Secondary | ICD-10-CM | POA: Diagnosis not present

## 2018-08-23 DIAGNOSIS — I5032 Chronic diastolic (congestive) heart failure: Secondary | ICD-10-CM | POA: Diagnosis present

## 2018-08-23 DIAGNOSIS — Z833 Family history of diabetes mellitus: Secondary | ICD-10-CM | POA: Diagnosis not present

## 2018-08-23 DIAGNOSIS — Z7901 Long term (current) use of anticoagulants: Secondary | ICD-10-CM

## 2018-08-23 DIAGNOSIS — N390 Urinary tract infection, site not specified: Secondary | ICD-10-CM | POA: Diagnosis present

## 2018-08-23 DIAGNOSIS — Z87891 Personal history of nicotine dependence: Secondary | ICD-10-CM

## 2018-08-23 DIAGNOSIS — Z951 Presence of aortocoronary bypass graft: Secondary | ICD-10-CM | POA: Diagnosis not present

## 2018-08-23 DIAGNOSIS — S72141D Displaced intertrochanteric fracture of right femur, subsequent encounter for closed fracture with routine healing: Principal | ICD-10-CM

## 2018-08-23 DIAGNOSIS — E7800 Pure hypercholesterolemia, unspecified: Secondary | ICD-10-CM | POA: Diagnosis present

## 2018-08-23 DIAGNOSIS — I252 Old myocardial infarction: Secondary | ICD-10-CM

## 2018-08-23 DIAGNOSIS — Z905 Acquired absence of kidney: Secondary | ICD-10-CM

## 2018-08-23 DIAGNOSIS — Z880 Allergy status to penicillin: Secondary | ICD-10-CM

## 2018-08-23 DIAGNOSIS — S72141S Displaced intertrochanteric fracture of right femur, sequela: Secondary | ICD-10-CM

## 2018-08-23 DIAGNOSIS — N183 Chronic kidney disease, stage 3 unspecified: Secondary | ICD-10-CM

## 2018-08-23 DIAGNOSIS — Z7984 Long term (current) use of oral hypoglycemic drugs: Secondary | ICD-10-CM

## 2018-08-23 DIAGNOSIS — Z79899 Other long term (current) drug therapy: Secondary | ICD-10-CM

## 2018-08-23 LAB — BASIC METABOLIC PANEL
Anion gap: 13 (ref 5–15)
BUN: 81 mg/dL — ABNORMAL HIGH (ref 8–23)
CO2: 17 mmol/L — ABNORMAL LOW (ref 22–32)
Calcium: 8.6 mg/dL — ABNORMAL LOW (ref 8.9–10.3)
Chloride: 103 mmol/L (ref 98–111)
Creatinine, Ser: 4.75 mg/dL — ABNORMAL HIGH (ref 0.61–1.24)
GFR calc Af Amer: 12 mL/min — ABNORMAL LOW (ref >60)
GFR calc non Af Amer: 11 mL/min — ABNORMAL LOW (ref >60)
Glucose, Bld: 95 mg/dL (ref 70–99)
Potassium: 3.9 mmol/L (ref 3.5–5.1)
Sodium: 133 mmol/L — ABNORMAL LOW (ref 135–145)

## 2018-08-23 LAB — CBC
HCT: 23.6 % — ABNORMAL LOW (ref 39.0–52.0)
Hemoglobin: 8 g/dL — ABNORMAL LOW (ref 13.0–17.0)
MCH: 31.3 pg (ref 26.0–34.0)
MCHC: 33.9 g/dL (ref 30.0–36.0)
MCV: 92.2 fL (ref 80.0–100.0)
Platelets: 158 10*3/uL (ref 150–400)
RBC: 2.56 MIL/uL — ABNORMAL LOW (ref 4.22–5.81)
RDW: 18.5 % — ABNORMAL HIGH (ref 11.5–15.5)
WBC: 13.9 10*3/uL — ABNORMAL HIGH (ref 4.0–10.5)
nRBC: 0.6 % — ABNORMAL HIGH (ref 0.0–0.2)

## 2018-08-23 LAB — GLUCOSE, CAPILLARY
Glucose-Capillary: 73 mg/dL (ref 70–99)
Glucose-Capillary: 96 mg/dL (ref 70–99)
Glucose-Capillary: 83 mg/dL (ref 70–99)
Glucose-Capillary: 83 mg/dL (ref 70–99)

## 2018-08-23 MED ORDER — ACETAMINOPHEN 325 MG PO TABS: 325.0000 mg | ORAL_TABLET | Freq: Four times a day (QID) | ORAL | Status: DC | PRN

## 2018-08-23 MED ORDER — LEVOTHYROXINE SODIUM 100 MCG PO TABS
100.0000 ug | ORAL_TABLET | Freq: Every day | ORAL | Status: DC
  Administered 2018-08-24 – 2018-08-25 (×2): 100 ug via ORAL
  Filled 2018-08-23 (×2): qty 1

## 2018-08-23 MED ORDER — POLYVINYL ALCOHOL 1.4 % OP SOLN
1.0000 [drp] | Freq: Every day | OPHTHALMIC | Status: DC
  Administered 2018-08-24: 1 [drp] via OPHTHALMIC
  Filled 2018-08-23: qty 15

## 2018-08-23 MED ORDER — HYDROCODONE-ACETAMINOPHEN 5-325 MG PO TABS
1.0000 | ORAL_TABLET | Freq: Four times a day (QID) | ORAL | Status: DC | PRN
  Administered 2018-08-24: 2 via ORAL
  Administered 2018-08-24: 1 via ORAL
  Filled 2018-08-23: qty 2
  Filled 2018-08-23: qty 1

## 2018-08-23 MED ORDER — METOPROLOL TARTRATE 25 MG PO TABS
25.0000 mg | ORAL_TABLET | Freq: Two times a day (BID) | ORAL | Status: DC
  Administered 2018-08-23 – 2018-08-24 (×2): 25 mg via ORAL
  Filled 2018-08-23 (×3): qty 1

## 2018-08-23 MED ORDER — METHOCARBAMOL 1000 MG/10ML IJ SOLN
500.0000 mg | Freq: Four times a day (QID) | INTRAVENOUS | Status: DC | PRN
  Filled 2018-08-23: qty 5

## 2018-08-23 MED ORDER — DOCUSATE SODIUM 100 MG PO CAPS
100.0000 mg | ORAL_CAPSULE | Freq: Two times a day (BID) | ORAL | Status: DC
  Administered 2018-08-23 – 2018-08-24 (×3): 100 mg via ORAL
  Filled 2018-08-23 (×3): qty 1

## 2018-08-23 MED ORDER — ISOSORBIDE MONONITRATE ER 30 MG PO TB24
30.0000 mg | ORAL_TABLET | Freq: Every day | ORAL | Status: DC
  Administered 2018-08-24: 08:00:00 30 mg via ORAL
  Filled 2018-08-23: qty 1

## 2018-08-23 MED ORDER — POLYETHYLENE GLYCOL 3350 17 G PO PACK
17.0000 g | PACK | Freq: Every day | ORAL | Status: DC | PRN
  Administered 2018-08-24: 17 g via ORAL
  Filled 2018-08-23: qty 1

## 2018-08-23 MED ORDER — ONDANSETRON HCL 4 MG PO TABS
4.0000 mg | ORAL_TABLET | Freq: Four times a day (QID) | ORAL | Status: DC | PRN
  Administered 2018-08-24: 4 mg via ORAL
  Filled 2018-08-23: qty 1

## 2018-08-23 MED ORDER — PROSIGHT PO TABS
1.0000 | ORAL_TABLET | Freq: Two times a day (BID) | ORAL | Status: DC
  Administered 2018-08-23 – 2018-08-24 (×3): 1 via ORAL
  Filled 2018-08-23 (×3): qty 1

## 2018-08-23 MED ORDER — INSULIN ASPART 100 UNIT/ML ~~LOC~~ SOLN
0.0000 [IU] | Freq: Three times a day (TID) | SUBCUTANEOUS | Status: DC
  Administered 2018-08-24: 1 [IU] via SUBCUTANEOUS

## 2018-08-23 MED ORDER — METHOCARBAMOL 500 MG PO TABS: 500.0000 mg | ORAL_TABLET | Freq: Four times a day (QID) | ORAL | Status: DC | PRN

## 2018-08-23 MED ORDER — ENSURE ENLIVE PO LIQD
237.0000 mL | Freq: Three times a day (TID) | ORAL | Status: DC
  Administered 2018-08-23 – 2018-08-24 (×2): 237 mL via ORAL

## 2018-08-23 MED ORDER — AMIODARONE HCL 200 MG PO TABS
200.0000 mg | ORAL_TABLET | Freq: Every day | ORAL | Status: DC
  Administered 2018-08-24: 200 mg via ORAL
  Filled 2018-08-23: qty 1

## 2018-08-23 MED ORDER — SORBITOL 70 % SOLN: 30.0000 mL | Freq: Every day | Status: DC | PRN

## 2018-08-23 MED ORDER — OXYCODONE-ACETAMINOPHEN 5-325 MG PO TABS: 1.0000 | ORAL_TABLET | Freq: Four times a day (QID) | ORAL | 0 refills | Status: AC | PRN

## 2018-08-23 MED ORDER — APIXABAN 2.5 MG PO TABS
2.5000 mg | ORAL_TABLET | Freq: Two times a day (BID) | ORAL | Status: DC
  Administered 2018-08-23 – 2018-08-24 (×3): 2.5 mg via ORAL
  Filled 2018-08-23 (×3): qty 1

## 2018-08-23 MED ORDER — ONDANSETRON HCL 4 MG/2ML IJ SOLN: 4.0000 mg | Freq: Four times a day (QID) | INTRAMUSCULAR | Status: DC | PRN

## 2018-08-23 MED ORDER — LIDOCAINE HCL URETHRAL/MUCOSAL 2 % EX GEL: CUTANEOUS | Status: DC | PRN

## 2018-08-23 NOTE — Discharge Summary (Signed)
Physician Discharge Summary  Elijah Saunders GBE:[id number] DOB: 1934/01/12 DOA: 08/19/2018  PCP: Aida Puffer, MD  Admit date: 08/19/2018 Discharge date: 08/28/2018  Admitted From: Inpatient Disposition: Rehab  Recommendations for Outpatient Follow-up:  1. Follow up with PCP in 1-2 weeks 2. Please obtain BMP/CBC in one week  Home Health:No Equipment/Devices:none  Discharge Condition:Stable CODE STATUS:Full code Diet recommendation: Diabetic diet  Brief/Interim Summary: Per HPI: Elijah Saunders a 84 y.o.malewith medical history significant ofCHF dt ICM with EF 20-25%, A.Fib, DM2, CKD stage 3. Patient had mechanical fall while trying to get out of chair today. Fell on R hip. Severe pain with movement, unable to bear weight. Ambulance called and pt brought to ED.R hip intertroch fxnoted in the ED. Also found to have AKIwith creat of 6 and BUN just over 100. Looks like he was 1.3 and 27 respectively back in Nov 2019. Patient has been in his normal state of health over the last few months has had a decreased appetite and some weight loss.EDPspoke to the wife over the phone who states that she was able to witness the fall and the patient has been at his normal mental baseline today. No recent fevers or URI symptoms. No changes in bowel or bladder habits that she is aware of.  Hospital course: Closed displaced right intertrochanteric fracture after mechanical fall: Patient seen by orthopedics and is now postop day #2 from IM nailing. Pain is been well controlled, was seen by cardiology for preoperative evaluation, Patient will continue with post hip protocol for PT OT with plans for transition to rehabilitation placement to obtain pre-admission level of functioning which include ability to perform ADLs. Continue with pain control and DVT prophylaxis-Eliquis restarted  Mild hematuria: no pain, likely related to foley insertion in pt with bph, will monitor hgb.  Discussed the  case with rehab team who will follow  Acute on chronic kidney disease stage III: Significant increase in creatinine from baseline of 1.6 in November to 6.1. Slowly trending down mildly acidotic,  recommend continue to follow creatinine is trending down  6.1>5.7>5.3>4.9>4.75.  Good urinary output no indication for acute intervention at this point follow closely nephrology as needed.  Continue to hold Lasix monitor weights closely.  Non-insulin-dependent diabetes type 2: I agreed with holding metformin I will cancel on discharge secondary renal function,  continue sliding scale insulin, monitor blood glucose AC at bedtime  Chronic systolic CHF second ischemic cardiomyopathy not in acute exacerbation.  Continue to hold Lasix although be cautious with fluids, continue indoor metoprolol, continue daily weights and strict I's and O's  Chronic A. fib currently rate controlled Eliquis restarted continue metoprolol amiodarone held by admitting physician, continue  CAD with history of MI and CABG. Patient was seen by cardiology, no anginal symptomatology for further management to cards  Discharge Diagnoses:  Principal Problem:   Closed displaced intertrochanteric fracture of right femur (HCC) Active Problems:   Chronic systolic CHF (congestive heart failure) (HCC)   Chronic renal disease, stage III (HCC)   Type 2 diabetes mellitus with renal manifestations, controlled (HCC)   Cardiomyopathy, ischemic-EF 35-40%   Persistent atrial fibrillation   Acute kidney failure (HCC)   Closed left hip fracture, initial encounter (HCC)   Malnutrition of moderate degree    Discharge Instructions  Discharge Instructions    Diet Carb Modified   Complete by:  As directed    Increase activity slowly   Complete by:  As directed    Weight bearing as tolerated  Complete by:  As directed      Allergies as of 09/18/2018      Reactions   Ace Inhibitors Other (See Comments)   Renal insuffiencey    Atorvastatin Other (See Comments)   Intolerant to many statins, muscle pain   Crestor [rosuvastatin Calcium] Other (See Comments)   Muscle pain   Zetia [ezetimibe] Other (See Comments)   Leg cramps   Lopid [gemfibrozil]    Unknown reaction   Penicillins Other (See Comments)   UNSPECIFIED REACTION  Has patient had a PCN reaction causing immediate rash, facial/tongue/throat swelling, SOB or lightheadedness with hypotension: unknown Has patient had a PCN reaction causing severe rash involving mucus membranes or skin necrosis: unknown Has patient had a PCN reaction that required hospitalization: unknown Has patient had a PCN reaction occurring within the last 10 years: no  If all of the above answers are "NO", then may proceed with Cephalosporin use.      Medication List    STOP taking these medications   Accu-Chek SmartView test strip Generic drug:  glucose blood   allopurinol 300 MG tablet Commonly known as:  ZYLOPRIM   furosemide 40 MG tablet Commonly known as:  LASIX   metFORMIN 500 MG tablet Commonly known as:  GLUCOPHAGE     TAKE these medications   acetaminophen 325 MG tablet Commonly known as:  TYLENOL Take 650 mg by mouth every 6 (six) hours as needed for headache (pain).   amiodarone 200 MG tablet Commonly known as:  PACERONE Take 1 tablet (200 mg total) by mouth daily.   BLUE-EMU MAXIMUM STRENGTH EX Apply 1 application topically 2 (two) times a day. For knee pain/ sore muscles   Eliquis 2.5 MG Tabs tablet Generic drug:  apixaban TAKE 1 TABLET BY MOUTH TWICE DAILY What changed:  how much to take   glimepiride 1 MG tablet Commonly known as:  AMARYL Take 1 mg by mouth daily with lunch.   isosorbide mononitrate 30 MG 24 hr tablet Commonly known as:  IMDUR TAKE 1 TABLET BY MOUTH DAILY What changed:  when to take this   levothyroxine 100 MCG tablet Commonly known as:  SYNTHROID Take 100 mcg by mouth daily before breakfast.   metoprolol tartrate 25 MG  tablet Commonly known as:  LOPRESSOR Take 1 tablet (25 mg total) by mouth 2 (two) times daily.   oxyCODONE-acetaminophen 5-325 MG tablet Commonly known as:  Percocet Take 1-2 tablets by mouth every 6 (six) hours as needed for severe pain.   PreserVision AREDS 2 Caps Take 1 capsule by mouth 2 (two) times daily.   Systane Ultra 0.4-0.3 % Soln Generic drug:  Polyethyl Glycol-Propyl Glycol Place 1 drop into both eyes daily.   vitamin B-12 500 MCG tablet Commonly known as:  CYANOCOBALAMIN Take 500 mcg by mouth at bedtime. Vitamin b12   Vitamin D (Ergocalciferol) 1.25 MG (50000 UT) Caps capsule Commonly known as:  DRISDOL Take 50,000 Units by mouth every Wednesday.            Discharge Care Instructions  (From admission, onward)         Start     Ordered   08/21/18 0000  Weight bearing as tolerated     08/21/18 1118         Follow-up Information    Tarry KosXu, Naiping M, MD In 2 weeks.   Specialty:  Orthopedic Surgery Why:  For suture removal, For wound re-check Contact information: [address] [place] [phone]  Allergies  Allergen Reactions  . Ace Inhibitors Other (See Comments)    Renal insuffiencey  . Atorvastatin Other (See Comments)    Intolerant to many statins, muscle pain  . Crestor [Rosuvastatin Calcium] Other (See Comments)    Muscle pain  . Zetia [Ezetimibe] Other (See Comments)    Leg cramps  . Lopid [Gemfibrozil]     Unknown reaction  . Penicillins Other (See Comments)    UNSPECIFIED REACTION  Has patient had a PCN reaction causing immediate rash, facial/tongue/throat swelling, SOB or lightheadedness with hypotension: unknown Has patient had a PCN reaction causing severe rash involving mucus membranes or skin necrosis: unknown Has patient had a PCN reaction that required hospitalization: unknown Has patient had a PCN reaction occurring within the last 10 years: no  If all of the above answers are "NO",  then may proceed with Cephalosporin use.     Consultations:  ORTHOPEDICS, CIR   Procedures/Studies: Dg Chest 1 View  Result Date: 08/19/2018 CLINICAL DATA:  85 year old male with a history of hip fracture EXAM: CHEST  1 VIEW COMPARISON:  01/14/2018, 09/04/2015 FINDINGS: Cardiomediastinal silhouette unchanged. Surgical changes of median sternotomy and CABG. Low lung volumes with reticular opacities throughout. No pneumothorax or pleural effusion. Mild fullness in the central vasculature with interlobular septal thickening. No confluent airspace disease. IMPRESSION: Low lung volumes with evidence of edema superimposed on chronic changes. Surgical changes of median sternotomy and CABG Electronically Signed   By: Gilmer Mor D.O.   On: 08/19/2018 21:04   Dg Pelvis 1-2 Views  Result Date: 08/19/2018 CLINICAL DATA:  Fall at home today. Pelvic right hip pain. Initial encounter. EXAM: PELVIS - 1-2 VIEW COMPARISON:  Right hip radiographs also obtained today FINDINGS: There is no evidence of pelvic fracture or diastasis. No pelvic bone lesions are seen. An intertrochanteric right hip fracture is seen. IMPRESSION: No evidence of pelvic fracture. Right hip fracture noted; see separate right hip x-ray report. Electronically Signed   By: Myles Rosenthal M.D.   On: 08/19/2018 20:54   Ct Head Wo Contrast  Result Date: 08/19/2018 CLINICAL DATA:  85 year old male status post fall on concrete patio. Vomiting. EXAM: CT HEAD WITHOUT CONTRAST CT CERVICAL SPINE WITHOUT CONTRAST TECHNIQUE: Multidetector CT imaging of the head and cervical spine was performed following the standard protocol without intravenous contrast. Multiplanar CT image reconstructions of the cervical spine were also generated. COMPARISON:  None. FINDINGS: CT HEAD FINDINGS Brain: Cerebral volume is within normal limits for age. No ventriculomegaly. No midline shift, mass effect, or evidence of intracranial mass lesion. No acute intracranial hemorrhage  identified. No cortically based acute infarct identified. Patchy and confluent bilateral cerebral white matter hypodensity. Bilateral basal ganglia lacunar infarcts, appearing more age indeterminate on the right (series 3, image 20). Small chronic appearing linear right cerebellar infarct on image 9. Vascular: Calcified atherosclerosis at the skull base. No suspicious intracranial vascular hyperdensity. Skull: Osteopenia.  No acute osseous abnormality identified. Sinuses/Orbits: Visualized paranasal sinuses and mastoids are well pneumatized. Other: Calcified scalp vessel atherosclerosis. No scalp hematoma. Negative orbits. CT CERVICAL SPINE FINDINGS Alignment: Preserved cervical lordosis. Cervicothoracic junction alignment is within normal limits. Bilateral posterior element alignment is within normal limits. Skull base and vertebrae: Osteopenia. Visualized skull base is intact. No atlanto-occipital dissociation. No acute osseous abnormality identified. Soft tissues and spinal canal: No prevertebral fluid or swelling. No visible canal hematoma. Bulky calcified carotid atherosclerosis in the neck. Disc levels: Left C7 cervical rib. Degenerative ligamentous hypertrophy about the odontoid. Disc degeneration  with vacuum disc at C5-C6. Mild if any degenerative cervical spinal stenosis. Upper chest: Partially visible apically lung scarring. Grossly intact visible upper thoracic levels. IMPRESSION: 1. No acute traumatic injury identified in the head or cervical spine. 2. Small vessel ischemia of the brain with age indeterminate bilateral basal ganglia lacunar infarcts. 3. Calcified atherosclerosis. Electronically Signed   By: Odessa Fleming M.D.   On: 08/19/2018 20:14   Ct Cervical Spine Wo Contrast  Result Date: 08/19/2018 CLINICAL DATA:  85 year old male status post fall on concrete patio. Vomiting. EXAM: CT HEAD WITHOUT CONTRAST CT CERVICAL SPINE WITHOUT CONTRAST TECHNIQUE: Multidetector CT imaging of the head and cervical  spine was performed following the standard protocol without intravenous contrast. Multiplanar CT image reconstructions of the cervical spine were also generated. COMPARISON:  None. FINDINGS: CT HEAD FINDINGS Brain: Cerebral volume is within normal limits for age. No ventriculomegaly. No midline shift, mass effect, or evidence of intracranial mass lesion. No acute intracranial hemorrhage identified. No cortically based acute infarct identified. Patchy and confluent bilateral cerebral white matter hypodensity. Bilateral basal ganglia lacunar infarcts, appearing more age indeterminate on the right (series 3, image 20). Small chronic appearing linear right cerebellar infarct on image 9. Vascular: Calcified atherosclerosis at the skull base. No suspicious intracranial vascular hyperdensity. Skull: Osteopenia.  No acute osseous abnormality identified. Sinuses/Orbits: Visualized paranasal sinuses and mastoids are well pneumatized. Other: Calcified scalp vessel atherosclerosis. No scalp hematoma. Negative orbits. CT CERVICAL SPINE FINDINGS Alignment: Preserved cervical lordosis. Cervicothoracic junction alignment is within normal limits. Bilateral posterior element alignment is within normal limits. Skull base and vertebrae: Osteopenia. Visualized skull base is intact. No atlanto-occipital dissociation. No acute osseous abnormality identified. Soft tissues and spinal canal: No prevertebral fluid or swelling. No visible canal hematoma. Bulky calcified carotid atherosclerosis in the neck. Disc levels: Left C7 cervical rib. Degenerative ligamentous hypertrophy about the odontoid. Disc degeneration with vacuum disc at C5-C6. Mild if any degenerative cervical spinal stenosis. Upper chest: Partially visible apically lung scarring. Grossly intact visible upper thoracic levels. IMPRESSION: 1. No acute traumatic injury identified in the head or cervical spine. 2. Small vessel ischemia of the brain with age indeterminate bilateral  basal ganglia lacunar infarcts. 3. Calcified atherosclerosis. Electronically Signed   By: Odessa Fleming M.D.   On: 08/19/2018 20:14   Dg Knee Complete 4 Views Right  Result Date: 08/19/2018 CLINICAL DATA:  Fall at home today. Right knee pain. Initial encounter. EXAM: RIGHT KNEE - COMPLETE 4+ VIEW COMPARISON:  None. FINDINGS: No evidence of fracture, dislocation, or joint effusion. Moderate medial compartment osteoarthritis is seen. 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 IMPRESSION: 1. No acute findings. 2. Moderate medial compartment osteoarthritis. Electronically Signed   By: Myles Rosenthal M.D.   On: 08/19/2018 20:56   Dg C-arm 1-60 Min  Result Date: 08/21/2018 CLINICAL DATA:  ORIF right intertrochanteric fracture EXAM: DG C-ARM 61-120 MIN COMPARISON:  None. FLUOROSCOPY TIME:  1 minutes 45 seconds FINDINGS: Right intertrochanteric fracture transfixed with an intramedullary nail and interlocking femoral neck screw. Anatomic alignment. IMPRESSION: Interval ORIF right intertrochanteric fracture. Electronically Signed   By: Elige Ko   On: 08/21/2018 11:35   Ct Renal Stone Study  Result Date: 08/19/2018 CLINICAL DATA:  Acute renal failure. Weight loss. Anorexia. EXAM: CT ABDOMEN AND PELVIS WITHOUT CONTRAST TECHNIQUE: Multidetector CT imaging of the abdomen and pelvis was performed following the standard protocol without IV contrast. COMPARISON:  None. FINDINGS: Lower chest: No  acute findings. Hepatobiliary: No mass visualized on this unenhanced exam. Probable tiny gallstones noted, however there is no evidence of cholecystitis or biliary duct dilatation. Pancreas: No mass or inflammatory process visualized on this unenhanced exam. Spleen:  Within normal limits in size. Adrenals/Urinary tract: Normal appearance of both adrenal glands. Left kidney is absent. Right renal vascular calcifications noted, without definite  calculi. No evidence of ureteral calculi or hydronephrosis. A few tiny calculi are noted within the urinary bladder. Stomach/Bowel: No evidence of obstruction, inflammatory process, or abnormal fluid collections. Diverticulosis is seen mainly involving the descending and sigmoid colon, however there is no evidence of diverticulitis. Vascular/Lymphatic: No pathologically enlarged lymph nodes identified. A suprarenal abdominal aortic aneurysm is seen measuring 4.7 cm in maximum diameter. Reproductive:  No mass or other significant abnormality. Other:  None. Musculoskeletal:  No suspicious bone lesions identified. IMPRESSION: 1. No acute findings. 2. Probable tiny gallstones. No radiographic evidence of cholecystitis. 3. Colonic diverticulosis, without radiographic evidence of cholecystitis. 4. Tiny calculi within urinary bladder. No evidence of ureteral calculi or hydronephrosis. 5. 4.7 cm suprarenal abdominal aortic aneurysm. Recommend followup by abdomen and pelvis CTA in 6 months, and vascular surgery referral/consultation if not already obtained. This recommendation follows ACR consensus guidelines: White Paper of the ACR Incidental Findings Committee II on Vascular Findings. J Am Coll Radiol 2013; 10:789-794. Electronically Signed   By: Myles Rosenthal M.D.   On: 08/19/2018 22:53   Dg Hip Operative Unilat W Or W/o Pelvis Right  Result Date: 08/21/2018 CLINICAL DATA:  ORIF right intertrochanteric fracture EXAM: DG C-ARM 61-120 MIN COMPARISON:  None. FLUOROSCOPY TIME:  1 minutes 45 seconds FINDINGS: Right intertrochanteric fracture transfixed with an intramedullary nail and interlocking femoral neck screw. Anatomic alignment. IMPRESSION: Interval ORIF right intertrochanteric fracture. Electronically Signed   By: Elige Ko   On: 08/21/2018 11:35   Dg Femur Min 2 Views Right  Result Date: 08/19/2018 CLINICAL DATA:  Fall at home today. Right hip and thigh pain. Initial encounter. EXAM: RIGHT FEMUR 2 VIEWS  COMPARISON:  None. FINDINGS: A nondisplaced intertrochanteric right hip fracture is seen. No evidence of distal femur fracture. Extensive peripheral vascular calcification noted. IMPRESSION: Nondisplaced intertrochanteric right hip fracture. Electronically Signed   By: Myles Rosenthal M.D.   On: 08/19/2018 20:55       Subjective: Reports he is doing well.  Pain reasonably well-controlled although more acute with ambulation and PT.  Discharge Exam: Vitals:   09/03/2018 0449 09/22/2018 1032  BP: 97/71   Pulse: 72   Resp: 16   Temp: 97.7 F (36.5 C) (!) 97.2 F (36.2 C)  SpO2: 95%    Vitals:   08/22/18 2144 09/17/2018 0449 09/03/2018 0500 09/06/2018 1032  BP: (!) 104/50 97/71    Pulse: 67 72    Resp: 16 16    Temp: 97.6 F (36.4 C) 97.7 F (36.5 C)  (!) 97.2 F (36.2 C)  TempSrc: Oral Oral    SpO2: 98% 95%    Weight:   60.5 kg   Height:        General: Pt is alert, awake, not in acute distress Cardiovascular: RRR, S1/S2 +, no rubs, no gallops Respiratory: CTA bilaterally, no wheezing, no rhonchi Abdominal: Soft, NT, ND, bowel sounds + Extremities: no edema, no cyanosis range of motion on affected surgical extremity, no bleedthrough or purulence at surgical site    The results of significant diagnostics from this hospitalization (including imaging, microbiology, ancillary and laboratory) are listed below for reference.  Microbiology: Recent Results (from the past 240 hour(s))  Surgical pcr screen     Status: None   Collection Time: 08/20/18 12:49 AM  Result Value Ref Range Status   MRSA, PCR NEGATIVE NEGATIVE Final   Staphylococcus aureus NEGATIVE NEGATIVE Final    Comment: (NOTE) The Xpert SA Assay (FDA approved for NASAL specimens in patients 67 years of age and older), is one component of a comprehensive surveillance program. It is not intended to diagnose infection nor to guide or monitor treatment. Performed at [facility], [address] [place],  [place] [address]   MRSA PCR Screening     Status: None   Collection Time: 08/20/18 11:24 PM  Result Value Ref Range Status   MRSA by PCR NEGATIVE NEGATIVE Final    Comment:        The GeneXpert MRSA Assay (FDA approved for NASAL specimens only), is one component of a comprehensive MRSA colonization surveillance program. It is not intended to diagnose MRSA infection nor to guide or monitor treatment for MRSA infections. Performed at [facility], [address], [place], [place] [address]      Labs: BNP (last 3 results) Recent Labs    01/14/18 1004  BNP 1,223.3*   Basic Metabolic Panel: Recent Labs  Lab 08/20/18 0144 08/20/18 1503 08/21/18 0205 08/22/18 0406 09-11-2018 0148  NA 136 135 133* 132* 133*  K 3.4* 3.2* 3.3* 4.1 3.9  CL 103 103 101 100 103  CO2 16* 18* 19* 19* 17*  GLUCOSE 73 110* 109* 167* 95  BUN 106* 98* 89* 81* 81*  CREATININE 6.10* 5.68* 5.34* 4.97* 4.75*  CALCIUM 9.0 8.8* 8.9 8.9 8.6*   Liver Function Tests: Recent Labs  Lab 08/19/18 1918  AST 37  ALT 44  ALKPHOS 74  BILITOT 0.7  PROT 6.9  ALBUMIN 3.3*   No results for input(s): LIPASE, AMYLASE in the last 168 hours. No results for input(s): AMMONIA in the last 168 hours. CBC: Recent Labs  Lab 08/19/18 1918 08/22/18 0406 09-11-18 0148  WBC 11.9* 12.0* 13.9*  NEUTROABS 9.6*  --   --   HGB 10.3* 8.9* 8.0*  HCT 31.0* 26.3* 23.6*  MCV 93.4 92.6 92.2  PLT 210 173 158   Cardiac Enzymes: No results for input(s): CKTOTAL, CKMB, CKMBINDEX, TROPONINI in the last 168 hours. BNP: Invalid input(s): POCBNP CBG: Recent Labs  Lab 08/22/18 1200 08/22/18 1700 08/22/18 2139 09-11-2018 0747 09-11-2018 1031  GLUCAP 179* 196* 125* 73 96   D-Dimer No results for input(s): DDIMER in the last 72 hours. Hgb A1c No results for input(s): HGBA1C in the last 72 hours. Lipid Profile No results for input(s): CHOL, HDL, LDLCALC, TRIG, CHOLHDL, LDLDIRECT in the last 72 hours. Thyroid function  studies No results for input(s): TSH, T4TOTAL, T3FREE, THYROIDAB in the last 72 hours.  Invalid input(s): FREET3 Anemia work up No results for input(s): VITAMINB12, FOLATE, FERRITIN, TIBC, IRON, RETICCTPCT in the last 72 hours. Urinalysis    Component Value Date/Time   COLORURINE YELLOW 08/19/2018 2310   APPEARANCEUR CLEAR 08/19/2018 2310   LABSPEC 1.010 08/19/2018 2310   PHURINE 5.0 08/19/2018 2310   GLUCOSEU NEGATIVE 08/19/2018 2310   HGBUR NEGATIVE 08/19/2018 2310   BILIRUBINUR NEGATIVE 08/19/2018 2310   KETONESUR NEGATIVE 08/19/2018 2310   PROTEINUR NEGATIVE 08/19/2018 2310   NITRITE NEGATIVE 08/19/2018 2310   LEUKOCYTESUR NEGATIVE 08/19/2018 2310   Sepsis Labs Invalid input(s): PROCALCITONIN,  WBC,  LACTICIDVEN Microbiology Recent Results (from the  past 240 hour(s))  Surgical pcr screen     Status: None   Collection Time: 08/20/18 12:49 AM  Result Value Ref Range Status   MRSA, PCR NEGATIVE NEGATIVE Final   Staphylococcus aureus NEGATIVE NEGATIVE Final    Comment: (NOTE) The Xpert SA Assay (FDA approved for NASAL specimens in patients 73 years of age and older), is one component of a comprehensive surveillance program. It is not intended to diagnose infection nor to guide or monitor treatment. Performed at [facility], [address] [place], [place] [address]   MRSA PCR Screening     Status: None   Collection Time: 08/20/18 11:24 PM  Result Value Ref Range Status   MRSA by PCR NEGATIVE NEGATIVE Final    Comment:        The GeneXpert MRSA Assay (FDA approved for NASAL specimens only), is one component of a comprehensive MRSA colonization surveillance program. It is not intended to diagnose MRSA infection nor to guide or monitor treatment for MRSA infections. Performed at [facility], [address] [place], [place] [address]      Time coordinating discharge: 35 minutes  SIGNED:   Burke Keels, MD  [facility] 09/16/2018, 11:30 AM Pager   If 7PM-7AM, please contact night-coverage [email or web address] Password [email or web address]

## 2018-08-23 NOTE — Progress Notes (Signed)
Patient admitted to 4W02 via bed, escorted by nursing staff.  Patient verbalized understanding of rehab process, specifically to personal belongings policy, visitation policy, fall prevention policy.  Appears to be in no immediate distress at this time.  Patient with multiple skin problems: DTI and stage II to sacrum; skin tear to left posterior upper arm (covered with tegaderm); skin tear to right forearm (tegaderm applied); surgical incision with original surgical dressings (old drainage marked); blistered areas to posterior penis; stage I to right lateral ankle; multiple bruised areas. Patient noted to be holding urinal to penis upon arrival.  Patient was unable to urinate.  Bladder scan >500; I & O cath performed with moderate amount of brownish urine with red blood clots and some bright red blood.  Patient had foley catheter removed this morning and had not urinated until we cathed him.  Patient noted to be on eliquis.  Notified Marissa Nestle, PA of findings.  New orders received to collect a urine culture and to collect a string of urine to be kept in bathroom to assess hematuria.   Dani Gobble, RN

## 2018-08-23 NOTE — Progress Notes (Signed)
Patient transferred to 4W2 as ordered.

## 2018-08-23 NOTE — Discharge Instructions (Signed)

## 2018-08-23 NOTE — Progress Notes (Signed)
Physical Therapy Treatment Patient Details Name: Elijah Saunders MRN: [id number] DOB: Aug 08, 1933 Today's Date: 09-06-2018    History of Present Illness Pt is an 85 y.o. male admitted 08/19/18 after mechanical fall sustaining R hip intertrochanteric fx. 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 S/p RLE IM nail 4/29. PMH includes CHF, afib, DM2, CKD3.    PT Comments    Pt progressing with mobility. Able to perform transfer and initiate gait training with RW, requiring mod-maxA+1. Pt with increased fatigue this session, also noted some increased confusion (may be related to pain meds?), but remains motivated to participate. Wife called during session to check in. Continue to recommend intensive CIR-level therapies to maximize functional mobility and independence.  SpO2 95% on RA  Post-ambulation BP 98/57 Seated in recliner ~2 min BP up to 103/56    Follow Up Recommendations  CIR;Supervision for mobility/OOB     Equipment Recommendations  Rolling walker with 5" wheels    Recommendations for Other Services       Precautions / Restrictions Precautions Precautions: Fall Restrictions Weight Bearing Restrictions: Yes RLE Weight Bearing: Weight bearing as tolerated    Mobility  Bed Mobility Overal bed mobility: Needs Assistance Bed Mobility: Supine to Sit     Supine to sit: Mod assist;HOB elevated     General bed mobility comments: ModA to assist RLE to EOB and UE support for trunk elevation. Once seated, heavy reliance on BUE support to scoot hips forwards  Transfers Overall transfer level: Needs assistance Equipment used: Rolling walker (2 wheeled) Transfers: Sit to/from Stand Sit to Stand: Max assist;From elevated surface         General transfer comment: MaxA to assist trunk elevation, pt with difficulty transitioning UE support to RW requiring maxA to prevent posterior LOB with this. MinA for eccentric control, increased time due to pain  Ambulation/Gait Ambulation/Gait  assistance: Min assist;Mod assist Gait Distance (Feet): 4 Feet Assistive device: Rolling walker (2 wheeled) Gait Pattern/deviations: Step-to pattern;Decreased weight shift to right;Antalgic;Trunk flexed Gait velocity: Decreased Gait velocity interpretation: <1.31 ft/sec, indicative of household ambulator General Gait Details: Slow, antalgic gait with RW and minA for balance, requiring up to modA as pt fatigued; cues for sequencing with steps and RW. Pt with difficulty clearing L foot for complete step due to painful RLE, but able to for a few steps   Stairs             Wheelchair Mobility    Modified Rankin (Stroke Patients Only)       Balance Overall balance assessment: Needs assistance   Sitting balance-Leahy Scale: Fair       Standing balance-Leahy Scale: Poor Standing balance comment: Reliant on UE support and external assist                            Cognition Arousal/Alertness: Awake/alert Behavior During Therapy: WFL for tasks assessed/performed Overall Cognitive Status: Within Functional Limits for tasks assessed                                 General Comments: WFL for simple tasks, although seems to have some increased confusion this session (reports very tired) requiring increased cues and repetition of cues, seems to mask/'laugh off' this (?)      Exercises      General Comments General comments (skin integrity, edema, etc.): SpO2 95% on RA. Post-transfer BP  98/57, sitting reclined 2 min BP 103/56      Pertinent Vitals/Pain Pain Assessment: Faces Faces Pain Scale: Hurts even more Pain Location: RLE Pain Descriptors / Indicators: Guarding;Grimacing Pain Intervention(s): Limited activity within patient's tolerance;Repositioned    Home Living                      Prior Function            PT Goals (current goals can now be found in the care plan section) Acute Rehab PT Goals Patient Stated Goal: "I need to  get back to walking and doing for myself" PT Goal Formulation: With patient Time For Goal Achievement: 09/05/18 Potential to Achieve Goals: Good Progress towards PT goals: Progressing toward goals    Frequency    Min 5X/week      PT Plan Current plan remains appropriate    Co-evaluation              AM-PAC PT "6 Clicks" Mobility   Outcome Measure  Help needed turning from your back to your side while in a flat bed without using bedrails?: A Lot Help needed moving from lying on your back to sitting on the side of a flat bed without using bedrails?: A Lot Help needed moving to and from a bed to a chair (including a wheelchair)?: A Lot Help needed standing up from a chair using your arms (e.g., wheelchair or bedside chair)?: A Lot Help needed to walk in hospital room?: A Lot Help needed climbing 3-5 steps with a railing? : Total 6 Click Score: 11    End of Session Equipment Utilized During Treatment: Gait belt Activity Tolerance: Patient tolerated treatment well;Patient limited by fatigue Patient left: in chair;with call bell/phone within reach;with chair alarm set Nurse Communication: Mobility status PT Visit Diagnosis: Other abnormalities of gait and mobility (R26.89);Muscle weakness (generalized) (M62.81);Pain Pain - Right/Left: Right Pain - part of body: Hip     Time: [id number] PT Time Calculation (min) (ACUTE ONLY): 29 min  Charges:  $Gait Training: 8-22 mins $Therapeutic Activity: 8-22 mins                    Ina HomesJaclyn Carmaleta Youngers, PT, DPT Acute Rehabilitation Services  Pager [phone] Office [phone]  Malachy ChamberJaclyn L Reiko Vinje 08/28/2018, 2:27 PM

## 2018-08-23 NOTE — Progress Notes (Signed)
 Subjective: 2 Days Post-Op Procedure(s) (LRB): INTRAMEDULLARY (IM) NAIL INTERTROCHANTRIC (Right) Patient reports pain as mild.  No events overnight.  Doing well this am.   Objective: Vital signs in last 24 hours: Temp:  [97.6 F (36.4 C)-97.8 F (36.6 C)] 97.7 F (36.5 C) (05/01 0449) Pulse Rate:  [52-72] 72 (05/01 0449) Resp:  [16] 16 (05/01 0449) BP: (95-111)/(50-71) 97/71 (05/01 0449) SpO2:  [91 %-98 %] 95 % (05/01 0449) Weight:  [60.5 kg] 60.5 kg (05/01 0500)  Intake/Output from previous day: 04/30 0701 - 05/01 0700 In: 360 [P.O.:360] Out: 600 [Urine:600] Intake/Output this shift: No intake/output data recorded.  Recent Labs    08/22/18 0406 09/15/2018 0148  HGB 8.9* 8.0*   Recent Labs    08/22/18 0406 09/17/2018 0148  WBC 12.0* 13.9*  RBC 2.84* 2.56*  HCT 26.3* 23.6*  PLT 173 158   Recent Labs    08/22/18 0406  0148  NA 132* 133*  K 4.1 3.9  CL 100 103  CO2 19* 17*  BUN 81* 81*  CREATININE 4.97* 4.75*  GLUCOSE 167* 95  CALCIUM 8.9 8.6*   No results for input(s): LABPT, INR in the last 72 hours.  Neurologically intact Neurovascular intact Sensation intact distally Intact pulses distally Dorsiflexion/Plantar flexion intact Incision: dressing C/D/I No cellulitis present Compartment soft   Assessment/Plan: 2 Days Post-Op Procedure(s) (LRB): INTRAMEDULLARY (IM) NAIL INTERTROCHANTRIC (Right) Up with therapy  WBAT RLE ABLA- continuing to trend down- will defer ? Transfusion to medicine team D/C dispo per medicine (looks like possible d/c to CIR today) F/U with Dr. Roda Shutters 2 weeks post-op for staple removal      Cristie Hem 09/18/2018, 7:40 AM

## 2018-08-23 NOTE — H&P (Signed)
Physical Medicine and Rehabilitation Admission H&P        Chief Complaint  Patient presents with   Fall  Chief complaint: Hip pain   HPI: Elijah Saunders is a 85 year old right-handed male with history of diastolic congestive heart failure with ejection fraction of 40%, AAA status post repair , CAD with CABG 2003, hypertension, hyperlipidemia, atrial fibrillation maintained on amiodarone as well as Eliquis, CKD stage III with left nephrectomy 1955 and type 2 diabetes mellitus.  Per chart review patient lives with spouse.  Independent with ambulation prior to admission and active.  One level home with one-step to entry.  Son and grandson also lives next door can assist as needed.  Presented 08/19/2018 after mechanical fall while trying to get out of a chair.  No loss of consciousness.  Cranial CT scan as well as CT cervical spine negative for acute findings.  There was noted small vessel ischemia of the brain with age-indeterminate bilateral basal ganglia lacunar infarcts.  X-rays and imaging revealed a nondisplaced intertrochanteric right hip fracture.  Noted elevated creatinine 6.18 with BUN 109 from baseline 1.63.  CT renal stone study negative for acute findings.  No evidence of ureteral calculi or hydronephrosis.  4.7 suprarenal abdominal aortic aneurysm as also noted 2017 with recommendations of pelvis CTA 6 months and vascular surgery follow-up.  Cardiology consulted in regards to atrial fibrillation and Eliquis was held.  Patient was cleared for surgery and underwent IM nailing 08/21/2018 per Dr.Xu.  Hospital course pain management weightbearing as tolerated.  Patient was cleared to resume Eliquis 08/22/2018.  Acute blood loss anemia 8.0.  Creatinine slowly improving 4.75 with monitoring of hydration.  Patient's home Lasix had been held due to elevated creatinine with strict INO's.  Patient's metformin placed on hold due to elevated creatinine.  Therapy evaluations completed and patient was  admitted for a comprehensive rehab program.     Review of Systems  Constitutional: Negative for chills and fever.  HENT: Negative for hearing loss.   Eyes: Negative for blurred vision and double vision.  Respiratory: Negative for cough and shortness of breath.   Cardiovascular: Positive for palpitations and leg swelling. Negative for chest pain.  Gastrointestinal: Positive for constipation. Negative for heartburn, nausea and vomiting.  Genitourinary: Positive for urgency. Negative for dysuria, flank pain and hematuria.  Musculoskeletal: Positive for joint pain and myalgias.  Skin: Negative for rash.  Neurological: Negative for seizures.  All other systems reviewed and are negative.       Past Medical History:  Diagnosis Date   AAA (abdominal aortic aneurysm) (HCC) 2003    s/p AAA R&G June 2003   CAD (coronary artery disease) 2003    s/p CABG, 2003    CHF (congestive heart failure) (HCC)      EF 35-40% by echo, May 2017   Chronic renal insufficiency, stage III (moderate) (HCC)     HTN (hypertension)     Hypercholesteremia      statin intol   MI (myocardial infarction) (HCC)     SBO (small bowel obstruction) [facility]) Nov 2002         Past Surgical History:  Procedure Laterality Date   ABDOMINAL AORTIC ANEURYSM REPAIR   June 2003   CARDIOVERSION N/A 09/08/2015    Procedure: CARDIOVERSION;  Surgeon: Laurey Morale, MD;  Location: [facility] ENDOSCOPY;  Service: Cardiovascular;  Laterality: N/A;   CARDIOVERSION N/A 03/20/2018    Procedure: CARDIOVERSION;  Surgeon: Vesta Mixer, MD;  Location: [facility] ENDOSCOPY;  Service: Cardiovascular;  Laterality: N/A;   CORONARY ARTERY BYPASS GRAFT   March 2003    x 4   HERNIA REPAIR       INTRAMEDULLARY (IM) NAIL INTERTROCHANTERIC Right 08/21/2018    Procedure: INTRAMEDULLARY (IM) NAIL INTERTROCHANTRIC;  Surgeon: Tarry Kos, MD;  Location: [facility] OR;  Service: Orthopedics;  Laterality: Right;   NEPHRECTOMY Left      1955   RIGHT/LEFT  HEART CATH AND CORONARY/GRAFT ANGIOGRAPHY N/A 12/19/2016    Procedure: RIGHT/LEFT HEART CATH AND CORONARY/GRAFT ANGIOGRAPHY;  Surgeon: Lyn Records, MD;  Location: [facility] INVASIVE CV LAB;  Service: Cardiovascular;  Laterality: N/A;   TEE WITHOUT CARDIOVERSION N/A 09/08/2015    Procedure: TRANSESOPHAGEAL ECHOCARDIOGRAM (TEE);  Surgeon: Laurey Morale, MD;  Location: [facility] ENDOSCOPY;  Service: Cardiovascular;  Laterality: N/A;         Family History  Problem Relation Age of Onset   Alzheimer's disease Mother     Diabetes Mother     Gallbladder disease Mother     Cancer - Cervical Mother     Coronary artery disease Father     Diabetes Father      Social History:  reports that he quit smoking about 23 years ago. His smoking use included cigarettes. He has a 40.00 pack-year smoking history. He has never used smokeless tobacco. He reports that he does not drink alcohol or use drugs. Allergies:  Allergies  Allergen Reactions   Ace Inhibitors Other (See Comments)      Renal insuffiencey   Atorvastatin Other (See Comments)      Intolerant to many statins, muscle pain   Crestor [Rosuvastatin Calcium] Other (See Comments)      Muscle pain   Zetia [Ezetimibe] Other (See Comments)      Leg cramps   Lopid [Gemfibrozil]        Unknown reaction   Penicillins Other (See Comments)      UNSPECIFIED REACTION  Has patient had a PCN reaction causing immediate rash, facial/tongue/throat swelling, SOB or lightheadedness with hypotension: unknown Has patient had a PCN reaction causing severe rash involving mucus membranes or skin necrosis: unknown Has patient had a PCN reaction that required hospitalization: unknown Has patient had a PCN reaction occurring within the last 10 years: no  If all of the above answers are "NO", then may proceed with Cephalosporin use.            Medications Prior to Admission  Medication Sig Dispense Refill   acetaminophen (TYLENOL) 325 MG tablet Take 650 mg by  mouth every 6 (six) hours as needed for headache (pain).       allopurinol (ZYLOPRIM) 300 MG tablet Take 300 mg by mouth daily.       amiodarone (PACERONE) 200 MG tablet Take 1 tablet (200 mg total) by mouth daily. 60 tablet 11   cyanocobalamin 500 MCG tablet Take 500 mcg by mouth at bedtime. Vitamin b12       ELIQUIS 2.5 MG TABS tablet TAKE 1 TABLET BY MOUTH TWICE DAILY (Patient taking differently: Take 2.5 mg by mouth 2 (two) times daily. ) 180 tablet 2   furosemide (LASIX) 40 MG tablet Take 1 tablet (40 mg total) by mouth 2 (two) times daily. (Patient taking differently: Take 40 mg by mouth 2 (two) times daily with breakfast and lunch. ) 90 tablet 3   glimepiride (AMARYL) 1 MG tablet Take 1 mg by mouth daily with lunch.  isosorbide mononitrate (IMDUR) 30 MG 24 hr tablet TAKE 1 TABLET BY MOUTH DAILY (Patient taking differently: Take 30 mg by mouth daily with lunch. ) 30 tablet 1   levothyroxine (SYNTHROID, LEVOTHROID) 100 MCG tablet Take 100 mcg by mouth daily before breakfast.       Menthol, Topical Analgesic, (BLUE-EMU MAXIMUM STRENGTH EX) Apply 1 application topically 2 (two) times a day. For knee pain/ sore muscles       metFORMIN (GLUCOPHAGE) 500 MG tablet Take 500 mg by mouth 2 (two) times daily.        metoprolol tartrate (LOPRESSOR) 25 MG tablet Take 1 tablet (25 mg total) by mouth 2 (two) times daily. 60 tablet 11   Multiple Vitamins-Minerals (PRESERVISION AREDS 2) CAPS Take 1 capsule by mouth 2 (two) times daily.       Polyethyl Glycol-Propyl Glycol (SYSTANE ULTRA) 0.4-0.3 % SOLN Place 1 drop into both eyes daily.       Vitamin D, Ergocalciferol, (DRISDOL) 50000 units CAPS capsule Take 50,000 Units by mouth every Wednesday.       ACCU-CHEK SMARTVIEW test strip Use as directed to test blood glucose each morning.          Drug Regimen Review Drug regimen was reviewed and remains appropriate with no significant issues identified   Home: Home Living Family/patient  expects to be discharged to:: Private residence Living Arrangements: Spouse/significant other Available Help at Discharge: Family, Available 24 hours/day Type of Home: House Home Access: Stairs to enter [employer] of Steps: 1 Home Layout: One level Home Equipment: None Additional Comments: Lives with wife. Son and grandson live next door   Functional History: Prior Function Level of Independence: Independent Comments: Reports indep with ambulation although knees bother him. Remains active, enjoys gardening. Does not wear home O2   Functional Status:  Mobility: Bed Mobility Overal bed mobility: Needs Assistance Bed Mobility: Supine to Sit Supine to sit: Mod assist, +2 for safety/equipment General bed mobility comments: ModA+2 to assist RLE to EOB and trunk elevation. Once seated, pt able to scoot hips forwad with heavy reliance on BUE support due to R hip pain Transfers Overall transfer level: Needs assistance Equipment used: Rolling walker (2 wheeled) Transfers: Sit to/from Stand Sit to Stand: Mod assist, +2 physical assistance General transfer comment: ModA+2 to assist trunk elevation and prevent posterior LOB when transitioning hands to RW; cues for hand placement. MinA for eccentric control into sitting Ambulation/Gait Ambulation/Gait assistance: Min assist, +2 safety/equipment Gait Distance (Feet): 2 Feet Assistive device: Rolling walker (2 wheeled) Gait Pattern/deviations: Step-to pattern, Decreased weight shift to right, Antalgic, Trunk flexed General Gait Details: Took pivotal steps from bed to recliner with RW and minA+2 for balance; cues for sequencing with steps and RW Gait velocity: Decreased Gait velocity interpretation: <1.31 ft/sec, indicative of household ambulator   ADL: ADL Overall ADL's : Needs assistance/impaired Eating/Feeding: Set up, Sitting Grooming: Set up, Sitting Upper Body Bathing: Minimal assistance, Sitting Lower Body Bathing:  Maximal assistance, Sit to/from stand, Cueing for sequencing, Cueing for safety, +2 for safety/equipment Upper Body Dressing : Minimal assistance, Sitting Lower Body Dressing: Maximal assistance, Sit to/from stand, Cueing for sequencing, Cueing for safety, +2 for safety/equipment, +2 for physical assistance   Cognition: Cognition Overall Cognitive Status: Within Functional Limits for tasks assessed Orientation Level: Oriented X4 Cognition Arousal/Alertness: Awake/alert Behavior During Therapy: WFL for tasks assessed/performed Overall Cognitive Status: Within Functional Limits for tasks assessed General Comments: WFL for simple tasks   Physical Exam: Blood pressure 97/71,  pulse 72, temperature 97.7 F (36.5 C), temperature source Oral, resp. rate 16, height 5\' 8"  (1.727 m), weight 60.5 kg, SpO2 95 %. Physical Exam  Constitutional: No distress.  HENT:  Head: Normocephalic and atraumatic.  Eyes: Pupils are equal, round, and reactive to light. EOM are normal.  Neck: Normal range of motion. No thyromegaly present.  Cardiovascular: Exam reveals no friction rub.  No murmur heard. IRR IRR  Respiratory: Effort normal. No respiratory distress. He has no wheezes.  GI: Soft. He exhibits no distension. There is no abdominal tenderness.  Musculoskeletal:     Comments: Right thigh tender to palpation with associated edema  Neurological:  Patient is is a bit lethargic but makes good eye contact with examiner and follows commands.  Provides his name and age.  Follows commands. UE 4/5. RLE 2-/5 HF, KE limited by pain, ADF/PF 4/5. LLE 3-4/5 prox to distal. No sensory findings  Skin: He is not diaphoretic.  Hip incision clean and dry with some bruising, mild s/s drainage.   Psychiatric:  Pleasant, slightly confused      Lab Results Last 48 Hours        Results for orders placed or performed during the hospital encounter of 08/19/18 (from the past 48 hour(s))  Glucose, capillary     Status:  Abnormal    Collection Time: 08/21/18  7:24 AM  Result Value Ref Range    Glucose-Capillary 106 (H) 70 - 99 mg/dL  Glucose, capillary     Status: None    Collection Time: 08/21/18  9:58 AM  Result Value Ref Range    Glucose-Capillary 89 70 - 99 mg/dL  Glucose, capillary     Status: None    Collection Time: 08/21/18 12:37 PM  Result Value Ref Range    Glucose-Capillary 94 70 - 99 mg/dL  Glucose, capillary     Status: Abnormal    Collection Time: 08/21/18  4:57 PM  Result Value Ref Range    Glucose-Capillary 170 (H) 70 - 99 mg/dL  Glucose, capillary     Status: Abnormal    Collection Time: 08/21/18  9:47 PM  Result Value Ref Range    Glucose-Capillary 242 (H) 70 - 99 mg/dL    Comment 1 Notify RN      Comment 2 Document in Chart    CBC     Status: Abnormal    Collection Time: 08/22/18  4:06 AM  Result Value Ref Range    WBC 12.0 (H) 4.0 - 10.5 K/uL    RBC 2.84 (L) 4.22 - 5.81 MIL/uL    Hemoglobin 8.9 (L) 13.0 - 17.0 g/dL    HCT 28.7 (L) 86.7 - 52.0 %    MCV 92.6 80.0 - 100.0 fL    MCH 31.3 26.0 - 34.0 pg    MCHC 33.8 30.0 - 36.0 g/dL    RDW 67.2 (H) 09.4 - 15.5 %    Platelets 173 150 - 400 K/uL    nRBC 0.9 (H) 0.0 - 0.2 %      Comment: Performed at [facility], [address], [place], [place] [address]  Basic metabolic panel     Status: Abnormal    Collection Time: 08/22/18  4:06 AM  Result Value Ref Range    Sodium 132 (L) 135 - 145 mmol/L    Potassium 4.1 3.5 - 5.1 mmol/L      Comment: DELTA CHECK NOTED    Chloride 100 98 - 111 mmol/L    CO2  19 (L) 22 - 32 mmol/L    Glucose, Bld 167 (H) 70 - 99 mg/dL    BUN 81 (H) 8 - 23 mg/dL    Creatinine, Ser 1.61 (H) 0.61 - 1.24 mg/dL    Calcium 8.9 8.9 - 09.6 mg/dL    GFR calc non Af Amer 10 (L) >60 mL/min    GFR calc Af Amer 12 (L) >60 mL/min    Anion gap 13 5 - 15      Comment: Performed at [facility], [address], [place], [place] [address]  Glucose, capillary     Status: Abnormal    Collection Time:  08/22/18  8:54 AM  Result Value Ref Range    Glucose-Capillary 103 (H) 70 - 99 mg/dL  Glucose, capillary     Status: Abnormal    Collection Time: 08/22/18 12:00 PM  Result Value Ref Range    Glucose-Capillary 179 (H) 70 - 99 mg/dL  Glucose, capillary     Status: Abnormal    Collection Time: 08/22/18  5:00 PM  Result Value Ref Range    Glucose-Capillary 196 (H) 70 - 99 mg/dL  Glucose, capillary     Status: Abnormal    Collection Time: 08/22/18  9:39 PM  Result Value Ref Range    Glucose-Capillary 125 (H) 70 - 99 mg/dL  CBC     Status: Abnormal    Collection Time: 2018-09-13  1:48 AM  Result Value Ref Range    WBC 13.9 (H) 4.0 - 10.5 K/uL    RBC 2.56 (L) 4.22 - 5.81 MIL/uL    Hemoglobin 8.0 (L) 13.0 - 17.0 g/dL    HCT 98.1 (L) 19.1 - 52.0 %    MCV 92.2 80.0 - 100.0 fL    MCH 31.3 26.0 - 34.0 pg    MCHC 33.9 30.0 - 36.0 g/dL    RDW 47.8 (H) 29.5 - 15.5 %    Platelets 158 150 - 400 K/uL    nRBC 0.6 (H) 0.0 - 0.2 %      Comment: Performed at [facility], [address], [place], [place] [address]  Basic metabolic panel     Status: Abnormal    Collection Time: Sep 13, 2018  1:48 AM  Result Value Ref Range    Sodium 133 (L) 135 - 145 mmol/L    Potassium 3.9 3.5 - 5.1 mmol/L    Chloride 103 98 - 111 mmol/L    CO2 17 (L) 22 - 32 mmol/L    Glucose, Bld 95 70 - 99 mg/dL    BUN 81 (H) 8 - 23 mg/dL    Creatinine, Ser 8.65 (H) 0.61 - 1.24 mg/dL    Calcium 8.6 (L) 8.9 - 10.3 mg/dL    GFR calc non Af Amer 11 (L) >60 mL/min    GFR calc Af Amer 12 (L) >60 mL/min    Anion gap 13 5 - 15      Comment: Performed at [facility], [address], [place], [place] [address]       Imaging Results (Last 48 hours)  Dg C-arm 1-60 Min   Result Date: 08/21/2018 CLINICAL DATA:  ORIF right intertrochanteric fracture EXAM: DG C-ARM 61-120 MIN COMPARISON:  None. FLUOROSCOPY TIME:  1 minutes 45 seconds FINDINGS: Right intertrochanteric fracture transfixed with an intramedullary nail and  interlocking femoral neck screw. Anatomic alignment. IMPRESSION: Interval ORIF right intertrochanteric fracture. Electronically Signed   By: Elige Ko   On: 08/21/2018 11:35  Dg Hip Operative Unilat W Or W/o Pelvis Right   Result Date: 08/21/2018 CLINICAL DATA:  ORIF right intertrochanteric fracture EXAM: DG C-ARM 61-120 MIN COMPARISON:  None. FLUOROSCOPY TIME:  1 minutes 45 seconds FINDINGS: Right intertrochanteric fracture transfixed with an intramedullary nail and interlocking femoral neck screw. Anatomic alignment. IMPRESSION: Interval ORIF right intertrochanteric fracture. Electronically Signed   By: Elige KoHetal  Patel   On: 08/21/2018 11:35             Medical Problem List and Plan: 1.  Decreased functional mobility secondary to right hip intertrochanteric hip fracture.  Status post IM nailing 08/21/2018.  Weightbearing as tolerated             -admit to inpatient rehab 2.  Antithrombotics: -DVT/anticoagulation: Eliquis             -antiplatelet therapy: N/A 3. Pain Management: Hydrocodone and Robaxin as needed             -limit narcotics as possible due to neurosedation 4. Mood: Provide emotional support             -antipsychotic agents: N/A 5. Neuropsych: This patient is capable of making decisions on his own behalf. 6. Skin/Wound Care: Routine skin checks 7. Fluids/Electrolytes/Nutrition: Routine in and outs with follow-up chemistries             -encourage PO 8.  Acute blood loss anemia.  Follow-up CBC with admit labs 9.  Acute on chronic CKD stage III.  Baseline creatinine 1.63.  Slowly trending down.        -Follow-up chemistries on Monday 10.  Chronic diastolic congestive heart failure.  Monitor for any signs of fluid overload.  Lasix 40 mg twice daily currently on hold due to elevated creatinine             -check daily weights 11.  Chronic atrial fibrillation.   Lopressor 25 mg p.o. twice daily, amiodarone 200 mg daily.  Cardiac rate controlled.  Follow cardiology  services 12.  CAD with CABG.  No chest pain or shortness of breath.  Continue Imdur 13.  History of AAA with repair.  Follow-up CTA 6 months 14.  Diabetes mellitus.  Latest hemoglobin A1c 6.3.  Glucophage held due to elevated creatinine.  Patient on Amaryl 1 mg daily, Glucophage 500 mg twice daily prior to admission             -monitor for glucose patterns, adjust regimen as needed 15.  Hypothyroidism.  Synthroid 16.  Constipation.  Laxative assistance   Post Admission Physician Evaluation: 1. Functional deficits secondary  to right IT hip fx. 2. Patient is admitted to receive collaborative, interdisciplinary care between the physiatrist, rehab nursing staff, and therapy team. 3. Patient's level of medical complexity and substantial therapy needs in context of that medical necessity cannot be provided at a lesser intensity of care such as a SNF. 4. Patient has experienced substantial functional loss from his/her baseline which was documented above under the "Functional History" and "Functional Status" headings.  Judging by the patient's diagnosis, physical exam, and functional history, the patient has potential for functional progress which will result in measurable gains while on inpatient rehab.  These gains will be of substantial and practical use upon discharge  in facilitating mobility and self-care at the household level. 5. Physiatrist will provide 24 hour management of medical needs as well as oversight of the therapy plan/treatment and provide guidance as appropriate regarding the interaction of the two. 6. The Preadmission  Screening has been reviewed and patient status is unchanged unless otherwise stated above. 7. 24 hour rehab nursing will assist with bladder management, bowel management, safety, skin/wound care, disease management, medication administration, pain management and patient education  and help integrate therapy concepts, techniques,education, etc. 8. PT will assess and treat  for/with: Lower extremity strength, range of motion, stamina, balance, functional mobility, safety, adaptive techniques and equipment, pain mgt.   Goals are: supervision . 9. OT will assess and treat for/with: ADL's, functional mobility, safety, upper extremity strength, adaptive techniques and equipment, pain control.   Goals are: supervision to min assist. Therapy may proceed with showering this patient. 10. SLP will assess and treat for/with: n/a.  Goals are: n/a. 11. Case Management and Social Worker will assess and treat for psychological issues and discharge planning. 12. Team conference will be held weekly to assess progress toward goals and to determine barriers to discharge. 13. Patient will receive at least 3 hours of therapy per day at least 5 days per week. 14. ELOS: 10-14 days       15. Prognosis:  excellent   I have personally performed a face to face diagnostic evaluation of this patient and formulated the key components of the plan.  Additionally, I have personally reviewed laboratory data, imaging studies, as well as relevant notes and concur with the physician assistant's documentation above.  Ranelle Oyster, MD, FAAPMR   Mcarthur Rossetti Angiulli, PA-C 09-16-18

## 2018-08-23 NOTE — Progress Notes (Signed)
Inpatient Rehabilitation Admissions Coordinator  I met with patient at bedside. Pt very sleepy, but easily arousable by voice. Noted Vicodin at 0600 this am. Pt felt warm to touch and CNA verified temp per my request, afebrile. I have insurance approval and bed available to admit pt to inpt rehab today. I contacted pt's wife and daughter by phone and they are in agreement. Pt also in agreement. I contacted Dr. Harvie Junior and RN CM. I will make the arrangements to admit pt to inpt rehab today.  Danne Baxter, RN, MSN Rehab Admissions Coordinator [phone] 09/12/2018 11:18 AM

## 2018-08-23 NOTE — Progress Notes (Signed)
Meredith Staggers, MD  Physician  Physical Medicine and Rehabilitation  PMR Pre-admission  Signed  Date of Service:  09/05/2018 11:38 AM       Related encounter: ED to Hosp-Admission (Current) from 08/19/2018 in [place] all [x]Manual[x]Template[x]Copied  Added by: [x]Boyette, Vertis Kelch, RN[x]Swartz, Celesta Gentile, MD  []Hover for details PMR Admission Coordinator Pre-Admission Assessment  Patient: Elijah Saunders is an 85 y.o., male MRN: [id number] DOB: 08/02/1933 Height: 5' 8" (172.7 cm) Weight: 60.5 kg  Insurance Information HMO: yes  PPO:      PCP:      IPA:      80/20:      OTHER: medicare advantage plan PRIMARY: [employer] care Medicare      Policy#: [id number]      Subscriber: pt CM Name: Sharee Pimple      Phone#: none given    Fax#: [phone] Pre-Cert#: [id number]   approved for 3 days, updates due 5/4 to [facility] phone [phone] fax [phone]   Employer:  Benefits:  Phone #: [phone]     Name: 4/30 Eff. Date: 04/24/2018     Deduct: none      Out of Pocket Max: $4500      Life Max: none CIR: $325 co pay per day days 1 until 5      SNF: no co pay days 1 until 20; $160 co pay per day days 21 until 49; no co pay days 50 until 100 Outpatient: $35 per visit     Co-Pay: visits per medical neccesity Home Health: 100%      Co-Pay: visits per medical neccesity DME: 80%     Co-Pay: 20% Providers: in network  SECONDARY: Buyer, retail     #: Justin Mend [id number]      Subscriber: pt Pre-Cert#: no Auth required Benefits:  Phone #: [phone]     Name: 5/1 Eff. Date: 2009      Pays a benefit of $170 per day of inpt cost for first 10 days  Medicaid Application Date:       Case Manager:  Disability Application Date:       Case Worker:   The Data Collection Information Summary for patients in Inpatient Rehabilitation Facilities with attached Privacy Act [place]  Records was provided and verbally reviewed with: Patient and Family  Emergency Contact Information         Contact Information    Name Relation Home Work Mobile   [place] Spouse [phone]     Ofilia Neas Daughter   [phone]       Current Medical History  Patient Admitting Diagnosis: hip fx  History of Present Illness: Elijah Saunders is a76 year old right-handed male with history of diastolic congestive heart failure with ejection fraction of 40%, AAA status post repair , CAD with CABG 2003, hypertension, hyperlipidemia, atrial fibrillation maintained on amiodarone as well as Eliquis, CKD stage III with left nephrectomy 1955 and type 2 diabetes mellitus. Presented 08/19/2018 after mechanical fall while trying to get out of a rocking chair on the sun porch. No loss of consciousness. Cranial CT scan as well as CT cervical spine negative for acute findings. There was noted small vessel ischemia of the brain with age-indeterminate bilateral basal ganglia lacunar infarcts. X-rays and imaging revealed a nondisplaced intertrochanteric right hip fracture. Noted elevated creatinine 6.18 with BUN 109 from  baseline 1.63.CT renal stone study negative for acute findings. No evidence of ureteral calculi or hydronephrosis. 4.7 suprarenal abdominal aortic aneurysm as also noted 2017with recommendations of pelvis CTA 6 months and vascular surgery follow-up. Cardiology consulted in regards to atrial fibrillation and Eliquis was held. Patient was cleared for surgery and underwent IM nailing 08/21/2018 per Dr.Xu.Hospital course pain management weightbearing as tolerated. Patient was cleared to resume Eliquis 08/22/2018.Acute blood loss anemia 8.0. Creatinine slowly improving 4.75with monitoring of hydration. Patient's home Lasix had been held due to elevated creatinine with strict I and O's. Patient's metformin placed on hold due to elevated creatinine.   Patient's  medical record from [facility]  has been reviewed by the rehabilitation admission coordinator and physician.  Past Medical History      Past Medical History:  Diagnosis Date   AAA (abdominal aortic aneurysm) ([place]) 2003   s/p AAA R&G June 2003   CAD (coronary artery disease) 2003   s/p CABG, 2003    CHF (congestive heart failure) (HCC)    EF 35-40% by echo, May 2017   Chronic renal insufficiency, stage III (moderate) (HCC)    HTN (hypertension)    Hypercholesteremia    statin intol   MI (myocardial infarction) ([place])    SBO (small bowel obstruction) ([place]) Nov 2002    Family History   family history includes Alzheimer's disease in his mother; Cancer - Cervical in his mother; Coronary artery disease in his father; Diabetes in his father and mother; Gallbladder disease in his mother.  Prior Rehab/Hospitalizations Has the patient had prior rehab or hospitalizations prior to admission? Yes  Has the patient had major surgery during 100 days prior to admission? Yes             Current Medications  Current Facility-Administered Medications:    0.9 %  sodium chloride infusion, , Intravenous, Continuous, Xu, Marylynn Pearson, MD   0.9 %  sodium chloride infusion, , Intravenous, Continuous, Leandrew Koyanagi, MD, Last Rate: 75 mL/hr at 08/22/18 0949   acetaminophen (TYLENOL) tablet 325-650 mg, 325-650 mg, Oral, Q6H PRN, Leandrew Koyanagi, MD   alum & mag hydroxide-simeth (MAALOX/MYLANTA) 200-200-20 MG/5ML suspension 30 mL, 30 mL, Oral, Q4H PRN, Leandrew Koyanagi, MD   amiodarone (PACERONE) tablet 200 mg, 200 mg, Oral, Daily, Sherren Mocha, MD, 200 mg at 09/19/2018 1016   apixaban (ELIQUIS) tablet 2.5 mg, 2.5 mg, Oral, BID, Leandrew Koyanagi, MD, 2.5 mg at 09/02/2018 1015   dextrose 5 % solution, , Intravenous, Continuous, Leandrew Koyanagi, MD, Last Rate: 70 mL/hr at 08/20/18 1525   docusate sodium (COLACE) capsule 100 mg, 100 mg, Oral, BID, Leandrew Koyanagi, MD, 100 mg at 09/08/2018  1016   feeding supplement (ENSURE ENLIVE) (ENSURE ENLIVE) liquid 237 mL, 237 mL, Oral, TID BM, Spongberg, Audie Pinto, MD, 237 mL at 09/07/2018 1016   HYDROcodone-acetaminophen (NORCO) 7.5-325 MG per tablet 1-2 tablet, 1-2 tablet, Oral, Q4H PRN, Leandrew Koyanagi, MD, 2 tablet at 08/22/18 1634   HYDROcodone-acetaminophen (NORCO/VICODIN) 5-325 MG per tablet 1-2 tablet, 1-2 tablet, Oral, Q4H PRN, Leandrew Koyanagi, MD, 2 tablet at 09/16/2018 0602   insulin aspart (novoLOG) injection 0-9 Units, 0-9 Units, Subcutaneous, TID WC, Leandrew Koyanagi, MD, 2 Units at 08/22/18 1839   isosorbide mononitrate (IMDUR) 24 hr tablet 30 mg, 30 mg, Oral, Daily, Leandrew Koyanagi, MD, 30 mg at 09/21/2018 1015   levothyroxine (SYNTHROID) tablet 100 mcg, 100 mcg, Oral, QAC breakfast, Leandrew Koyanagi, MD,  100 mcg at 09/12/2018 0602   magnesium citrate solution 1 Bottle, 1 Bottle, Oral, Once PRN, Erlinda Hong, Marylynn Pearson, MD   menthol-cetylpyridinium (CEPACOL) lozenge 3 mg, 1 lozenge, Oral, PRN **OR** phenol (CHLORASEPTIC) mouth spray 1 spray, 1 spray, Mouth/Throat, PRN, Erlinda Hong, Marylynn Pearson, MD   methocarbamol (ROBAXIN) tablet 500 mg, 500 mg, Oral, Q6H PRN **OR** methocarbamol (ROBAXIN) 500 mg in dextrose 5 % 50 mL IVPB, 500 mg, Intravenous, Q6H PRN, Leandrew Koyanagi, MD   metoprolol tartrate (LOPRESSOR) tablet 25 mg, 25 mg, Oral, BID, Leandrew Koyanagi, MD, Stopped at 09/14/2018 1015   morphine 2 MG/ML injection 1 mg, 1 mg, Intravenous, Q2H PRN, Leandrew Koyanagi, MD, 1 mg at 08/21/18 1833   multivitamin (PROSIGHT) tablet 1 tablet, 1 tablet, Oral, BID, Leandrew Koyanagi, MD, 1 tablet at 09/04/2018 1015   ondansetron (ZOFRAN) tablet 4 mg, 4 mg, Oral, Q6H PRN **OR** ondansetron (ZOFRAN) injection 4 mg, 4 mg, Intravenous, Q6H PRN, Erlinda Hong, Marylynn Pearson, MD   polyethylene glycol (MIRALAX / GLYCOLAX) packet 17 g, 17 g, Oral, Daily PRN, Leandrew Koyanagi, MD   polyvinyl alcohol (LIQUIFILM TEARS) 1.4 % ophthalmic solution 1 drop, 1 drop, Both Eyes, Daily, Erlinda Hong, Naiping M, MD, 1 drop at  08/26/2018 1016   sorbitol 70 % solution 30 mL, 30 mL, Oral, Daily PRN, Leandrew Koyanagi, MD  Patients Current Diet:     Diet Order                  Diet Carb Modified         DIET DYS 2 Room service appropriate? Yes; Fluid consistency: Thin  Diet effective now               Precautions / Restrictions Precautions Precautions: Fall Restrictions Weight Bearing Restrictions: Yes RLE Weight Bearing: Weight bearing as tolerated   Has the patient had 2 or more falls or a fall with injury in the past year? No  Prior Activity Level Community (5-7x/wk): Independent, active, driving, no AD, gardens  Prior Functional Level Self Care: Did the patient need help bathing, dressing, using the toilet or eating? Independent  Indoor Mobility: Did the patient need assistance with walking from room to room (with or without device)? Independent  Stairs: Did the patient need assistance with internal or external stairs (with or without device)? Independent  Functional Cognition: Did the patient need help planning regular tasks such as shopping or remembering to take medications? Independent  Home Assistive Devices / Equipment Home Assistive Devices/Equipment: Blood pressure cuff, CBG Meter, Eyeglasses, Dentures (specify type), Cane (specify quad or straight) Home Equipment: None  Prior Device Use: Indicate devices/aids used by the patient prior to current illness, exacerbation or injury? None of the above  Current Functional Level Cognition  Overall Cognitive Status: Within Functional Limits for tasks assessed Orientation Level: Oriented X4 General Comments: WFL for simple tasks    Extremity Assessment (includes Sensation/Coordination)  Upper Extremity Assessment: Generalized weakness  Lower Extremity Assessment: Generalized weakness, RLE deficits/detail RLE Deficits / Details: s/p R hip IM nail RLE: Unable to fully assess due to pain RLE Coordination: decreased  gross motor, decreased fine motor    ADLs  Overall ADL's : Needs assistance/impaired Eating/Feeding: Set up, Sitting Grooming: Set up, Sitting Upper Body Bathing: Minimal assistance, Sitting Lower Body Bathing: Maximal assistance, Sit to/from stand, Cueing for sequencing, Cueing for safety, +2 for safety/equipment Upper Body Dressing : Minimal assistance, Sitting Lower Body Dressing: Maximal assistance, Sit to/from stand, Cueing for  sequencing, Cueing for safety, +2 for safety/equipment, +2 for physical assistance    Mobility  Overal bed mobility: Needs Assistance Bed Mobility: Supine to Sit Supine to sit: Mod assist, +2 for safety/equipment General bed mobility comments: ModA+2 to assist RLE to EOB and trunk elevation. Once seated, pt able to scoot hips forwad with heavy reliance on BUE support due to R hip pain    Transfers  Overall transfer level: Needs assistance Equipment used: Rolling walker (2 wheeled) Transfers: Sit to/from Stand Sit to Stand: Mod assist, +2 physical assistance General transfer comment: ModA+2 to assist trunk elevation and prevent posterior LOB when transitioning hands to RW; cues for hand placement. MinA for eccentric control into sitting    Ambulation / Gait / Stairs / Wheelchair Mobility  Ambulation/Gait Ambulation/Gait assistance: Min assist, +2 safety/equipment Gait Distance (Feet): 2 Feet Assistive device: Rolling walker (2 wheeled) Gait Pattern/deviations: Step-to pattern, Decreased weight shift to right, Antalgic, Trunk flexed General Gait Details: Took pivotal steps from bed to recliner with RW and minA+2 for balance; cues for sequencing with steps and RW Gait velocity: Decreased Gait velocity interpretation: <1.31 ft/sec, indicative of household ambulator    Posture / Balance Balance Overall balance assessment: Needs assistance Sitting balance-Leahy Scale: Fair Standing balance-Leahy Scale: Poor Standing balance comment: Reliant on UE  support and external assist    Special needs/care consideration BiPAP/CPAP  N/a CPM  N/a Continuous Drip IV  N/a Dialysis n/a Life Vest  N/a Oxygen  O2 at 2 liters nasal cannula; none pta Special Bed  N/a Trach Size  N/a Wound Vac n/a Skin surgical incision; ecchymosis BUE Bowel mgmt:  Continent LBM 4/27 Bladder mgmt: foley removed 4/30; has voided since with hematuria; felt to be trauma related, BPH pta, Eliquis resumed Diabetic mgmt:  yes Behavioral consideration  N/a Chemo/radiation  N/a   Previous Home Environment  Living Arrangements: Spouse/significant other  Lives With: Spouse Available Help at Discharge: Family, Available 24 hours/day Type of Home: House Home Layout: One level Home Access: Stairs to enter Technical brewer of Steps: 1 Bathroom Shower/Tub: Tub/shower unit, Architectural technologist: Standard Bathroom Accessibility: Yes How Accessible: Accessible via walker Home Care Services: No Additional Comments: Lives with wife. Son and grandson live next door  Discharge Living Setting Plans for Discharge Living Setting: Patient's home, Lives with (comment)(wife) Type of Home at Discharge: House Discharge Home Layout: One level Discharge Home Access: Stairs to enter Entrance Stairs-Rails: None Entrance Stairs-Number of Steps: 1 Discharge Bathroom Shower/Tub: Tub/shower unit, Curtain Discharge Bathroom Toilet: Standard Discharge Bathroom Accessibility: Yes How Accessible: Accessible via walker Does the patient have any problems obtaining your medications?: No   Daughter . Kristi , pt and wife rely on her to convey all information  patient's wedding ring, gold band, is in [facility] safe.  Social/Family/Support Systems Patient Roles: Spouse, Parent Contact Information: wife, Belenda Cruise first and then also daughter, Steffanie Dunn Anticipated Caregiver: wife , daughter and son Anticipated Caregiver's Contact Information: see above Ability/Limitations of  Caregiver: none Caregiver Availability: 24/7 Discharge Plan Discussed with Primary Caregiver: Yes Is Caregiver In Agreement with Plan?: Yes Does Caregiver/Family have Issues with Lodging/Transportation while Pt is in Rehab?: No  Goals/Additional Needs Patient/Family Goal for Rehab: supervision PT, supervision to min OT Expected length of stay: ELOS 10 to 14 days Dietary Needs: patietn with poor diet pta; needs encouragement and supplements Pt/Family Agrees to Admission and willing to participate: Yes Program Orientation Provided & Reviewed with Pt/Caregiver Including Roles  & Responsibilities: Yes  Decrease  burden of Care through IP rehab admission: n/a  Possible need for SNF placement upon discharge: not anticipated  Patient Condition: I have reviewed medical records from [facility], spoken with CM, and patient, spouse and daughter. I met with patient at the bedside for inpatient rehabilitation assessment.  Patient will benefit from ongoing PT and OT, can actively participate in 3 hours of therapy a day 5 days of the week, and can make measurable gains during the admission.  Patient will also benefit from the coordinated team approach during an Inpatient Acute Rehabilitation admission.  The patient will receive intensive therapy as well as Rehabilitation physician, nursing, social worker, and care management interventions.  Due to bladder management, bowel management, safety, skin/wound care, disease management, medication administration, pain management and patient education the patient requires 24 hour a day rehabilitation nursing.  The patient is currently mod assist with mobility and basic ADLs.  Discharge setting and therapy post discharge at home with home health is anticipated.  Patient has agreed to participate in the Acute Inpatient Rehabilitation Program and will admit today.  Preadmission Screen Completed By:  Cleatrice Burke RN MSN, 09/22/2018 11:39  AM ______________________________________________________________________   Discussed status with Dr. Naaman Plummer on  09/14/2018 at 1200 and received approval for admission today.  Admission Coordinator:  Cleatrice Burke, RN MSN, time 1200 Date 08/29/2018   Assessment/Plan: Diagnosis:right IT hip fx 1. Does the need for close, 24 hr/day Medical supervision in concert with the patient's rehab needs make it unreasonable for this patient to be served in a less intensive setting? Yes 2. Co-Morbidities requiring supervision/potential complications: CAD, afib, htn 3. Due to bladder management, bowel management, safety, skin/wound care, disease management, medication administration, pain management and patient education, does the patient require 24 hr/day rehab nursing? Yes 4. Does the patient require coordinated care of a physician, rehab nurse, PT (1-2 hrs/day, 5 days/week) and OT (1-2 hrs/day, 5 days/week) to address physical and functional deficits in the context of the above medical diagnosis(es)? Yes Addressing deficits in the following areas: balance, endurance, locomotion, strength, transferring, bowel/bladder control, bathing, dressing, feeding, grooming, toileting and psychosocial support 5. Can the patient actively participate in an intensive therapy program of at least 3 hrs of therapy 5 days a week? Yes 6. The potential for patient to make measurable gains while on inpatient rehab is excellent 7. Anticipated functional outcomes upon discharge from inpatients are: supervision PT, supervision and min assist OT, n/a SLP 8. Estimated rehab length of stay to reach the above functional goals is: 10-14 days 9. Anticipated D/C setting: Home 10. Anticipated post D/C treatments: [place] therapy 11. Overall Rehab/Functional Prognosis: excellent  MD Signature: Meredith Staggers, MD, [facility] 09/12/2018         Revision History

## 2018-08-23 NOTE — PMR Pre-admission (Signed)
PMR Admission Coordinator Pre-Admission Assessment  Patient: Elijah Saunders is an 84 y.o., male MRN: [id number] DOB: 11/11/1933 Height: 5' 8" (172.7 cm) Weight: 60.5 kg  Insurance Information HMO: yes  PPO:      PCP:      IPA:      80/20:      OTHER: medicare advantage plan PRIMARY: [employer] Medicare      Policy#: [id number]      Subscriber: pt CM Name: Jill      Phone#: none given    Fax#: [phone] Pre-Cert#: [id number]   approved for 3 days, updates due 5/4 to Princess phone [phone] fax [phone]   Employer:  Benefits:  Phone #: [phone]     Name: 4/30 Eff. Date: 04/24/2018     Deduct: none      Out of Pocket Max: $4500      Life Max: none CIR: $325 co pay per day days 1 until 5      SNF: no co pay days 1 until 20; $160 co pay per day days 21 until 49; no co pay days 50 until 100 Outpatient: $35 per visit     Co-Pay: visits per medical neccesity Home Health: 100%      Co-Pay: visits per medical neccesity DME: 80%     Co-Pay: 20% Providers: in network  SECONDARY: Guaranteed Trust policy/Gap policy     #: GTA [id number]      Subscriber: pt Pre-Cert#: no Auth required Benefits:  Phone #: [phone]     Name: 5/1 Eff. Date: 2009      Pays a benefit of $170 per day of inpt cost for first 10 days  Medicaid Application Date:       Case Manager:  Disability Application Date:       Case Worker:   The "Data Collection Information Summary" for patients in Inpatient Rehabilitation Facilities with attached "Privacy Act Statement-Health Care Records" was provided and verbally reviewed with: Patient and Family  Emergency Contact Information Contact Information    Name Relation Home Work Mobile   Orzechowski,Katherine M Spouse [phone]     cornett, kristi Daughter   [phone]       Current Medical History  Patient Admitting Diagnosis: hip fx  History of Present Illness:  Elijah Saunders is a 84-year-old right-handed male with history of diastolic congestive  heart failure with ejection fraction of 40%, AAA status post repair , CAD with CABG 2003, hypertension, hyperlipidemia, atrial fibrillation maintained on amiodarone as well as Eliquis, CKD stage III with left nephrectomy 1955 and type 2 diabetes mellitus.  Presented 08/19/2018 after mechanical fall while trying to get out of a rocking chair on the sun porch.  No loss of consciousness.  Cranial CT scan as well as CT cervical spine negative for acute findings.  There was noted small vessel ischemia of the brain with age-indeterminate bilateral basal ganglia lacunar infarcts.  X-rays and imaging revealed a nondisplaced intertrochanteric right hip fracture.  Noted elevated creatinine 6.18 with BUN 109 from baseline 1.63.  CT renal stone study negative for acute findings.  No evidence of ureteral calculi or hydronephrosis.  4.7 suprarenal abdominal aortic aneurysm as also noted 2017 with recommendations of pelvis CTA 6 months and vascular surgery follow-up.  Cardiology consulted in regards to atrial fibrillation and Eliquis was held.  Patient was cleared for surgery and underwent IM nailing 08/21/2018 per Dr.Xu.  Hospital course pain management weightbearing as tolerated.  Patient was cleared to   resume Eliquis 08/22/2018.  Acute blood loss anemia 8.0.  Creatinine slowly improving 4.75 with monitoring of hydration.  Patient's home Lasix had been held due to elevated creatinine with strict I and O's.  Patient's metformin placed on hold due to elevated creatinine.    Patient's medical record from [facility]  has been reviewed by the rehabilitation admission coordinator and physician.  Past Medical History  Past Medical History:  Diagnosis Date  . AAA (abdominal aortic aneurysm) (HCC) 2003   s/p AAA R&G June 2003  . CAD (coronary artery disease) 2003   s/p CABG, 2003   . CHF (congestive heart failure) (HCC)    EF 35-40% by echo, May 2017  . Chronic renal insufficiency, stage III (moderate) (HCC)   . HTN  (hypertension)   . Hypercholesteremia    statin intol  . MI (myocardial infarction) (HCC)   . SBO (small bowel obstruction) (HCC) Nov 2002    Family History   family history includes Alzheimer's disease in his mother; Cancer - Cervical in his mother; Coronary artery disease in his father; Diabetes in his father and mother; Gallbladder disease in his mother.  Prior Rehab/Hospitalizations Has the patient had prior rehab or hospitalizations prior to admission? Yes  Has the patient had major surgery during 100 days prior to admission? Yes   Current Medications  Current Facility-Administered Medications:  .  0.9 %  sodium chloride infusion, , Intravenous, Continuous, Xu, Naiping M, MD .  0.9 %  sodium chloride infusion, , Intravenous, Continuous, Xu, Naiping M, MD, Last Rate: 75 mL/hr at 08/22/18 0949 .  acetaminophen (TYLENOL) tablet 325-650 mg, 325-650 mg, Oral, Q6H PRN, Xu, Naiping M, MD .  alum & mag hydroxide-simeth (MAALOX/MYLANTA) 200-200-20 MG/5ML suspension 30 mL, 30 mL, Oral, Q4H PRN, Xu, Naiping M, MD .  amiodarone (PACERONE) tablet 200 mg, 200 mg, Oral, Daily, Cooper, Michael, MD, 200 mg at 09/22/2018 1016 .  apixaban (ELIQUIS) tablet 2.5 mg, 2.5 mg, Oral, BID, Xu, Naiping M, MD, 2.5 mg at 09/06/2018 1015 .  dextrose 5 % solution, , Intravenous, Continuous, Xu, Naiping M, MD, Last Rate: 70 mL/hr at 08/20/18 1525 .  docusate sodium (COLACE) capsule 100 mg, 100 mg, Oral, BID, Xu, Naiping M, MD, 100 mg at 09/15/2018 1016 .  feeding supplement (ENSURE ENLIVE) (ENSURE ENLIVE) liquid 237 mL, 237 mL, Oral, TID BM, Spongberg, Christopher N, MD, 237 mL at 09/05/2018 1016 .  HYDROcodone-acetaminophen (NORCO) 7.5-325 MG per tablet 1-2 tablet, 1-2 tablet, Oral, Q4H PRN, Xu, Naiping M, MD, 2 tablet at 08/22/18 1634 .  HYDROcodone-acetaminophen (NORCO/VICODIN) 5-325 MG per tablet 1-2 tablet, 1-2 tablet, Oral, Q4H PRN, Xu, Naiping M, MD, 2 tablet at 08/25/2018 0602 .  insulin aspart (novoLOG) injection  0-9 Units, 0-9 Units, Subcutaneous, TID WC, Xu, Naiping M, MD, 2 Units at 08/22/18 1839 .  isosorbide mononitrate (IMDUR) 24 hr tablet 30 mg, 30 mg, Oral, Daily, Xu, Naiping M, MD, 30 mg at 08/24/2018 1015 .  levothyroxine (SYNTHROID) tablet 100 mcg, 100 mcg, Oral, QAC breakfast, Xu, Naiping M, MD, 100 mcg at 09/21/2018 0602 .  magnesium citrate solution 1 Bottle, 1 Bottle, Oral, Once PRN, Xu, Naiping M, MD .  menthol-cetylpyridinium (CEPACOL) lozenge 3 mg, 1 lozenge, Oral, PRN **OR** phenol (CHLORASEPTIC) mouth spray 1 spray, 1 spray, Mouth/Throat, PRN, Xu, Naiping M, MD .  methocarbamol (ROBAXIN) tablet 500 mg, 500 mg, Oral, Q6H PRN **OR** methocarbamol (ROBAXIN) 500 mg in dextrose 5 % 50 mL IVPB, 500 mg, Intravenous, Q6H   PRN, Leandrew Koyanagi, MD .  metoprolol tartrate (LOPRESSOR) tablet 25 mg, 25 mg, Oral, BID, Leandrew Koyanagi, MD, Stopped at 09/04/2018 1015 .  morphine 2 MG/ML injection 1 mg, 1 mg, Intravenous, Q2H PRN, Leandrew Koyanagi, MD, 1 mg at 08/21/18 1833 .  multivitamin (PROSIGHT) tablet 1 tablet, 1 tablet, Oral, BID, Leandrew Koyanagi, MD, 1 tablet at 08/30/2018 1015 .  ondansetron (ZOFRAN) tablet 4 mg, 4 mg, Oral, Q6H PRN **OR** ondansetron (ZOFRAN) injection 4 mg, 4 mg, Intravenous, Q6H PRN, Erlinda Hong, Marylynn Pearson, MD .  polyethylene glycol (MIRALAX / GLYCOLAX) packet 17 g, 17 g, Oral, Daily PRN, Leandrew Koyanagi, MD .  polyvinyl alcohol (LIQUIFILM TEARS) 1.4 % ophthalmic solution 1 drop, 1 drop, Both Eyes, Daily, Leandrew Koyanagi, MD, 1 drop at 08/31/2018 1016 .  sorbitol 70 % solution 30 mL, 30 mL, Oral, Daily PRN, Leandrew Koyanagi, MD  Patients Current Diet:  Diet Order            Diet Carb Modified        DIET DYS 2 Room service appropriate? Yes; Fluid consistency: Thin  Diet effective now              Precautions / Restrictions Precautions Precautions: Fall Restrictions Weight Bearing Restrictions: Yes RLE Weight Bearing: Weight bearing as tolerated   Has the patient had 2 or more falls or a fall with  injury in the past year? No  Prior Activity Level Community (5-7x/wk): Independent, active, driving, no AD, gardens  Prior Functional Level Self Care: Did the patient need help bathing, dressing, using the toilet or eating? Independent  Indoor Mobility: Did the patient need assistance with walking from room to room (with or without device)? Independent  Stairs: Did the patient need assistance with internal or external stairs (with or without device)? Independent  Functional Cognition: Did the patient need help planning regular tasks such as shopping or remembering to take medications? Independent  Home Assistive Devices / Equipment Home Assistive Devices/Equipment: Blood pressure cuff, CBG Meter, Eyeglasses, Dentures (specify type), Cane (specify quad or straight) Home Equipment: None  Prior Device Use: Indicate devices/aids used by the patient prior to current illness, exacerbation or injury? None of the above  Current Functional Level Cognition  Overall Cognitive Status: Within Functional Limits for tasks assessed Orientation Level: Oriented X4 General Comments: WFL for simple tasks    Extremity Assessment (includes Sensation/Coordination)  Upper Extremity Assessment: Generalized weakness  Lower Extremity Assessment: Generalized weakness, RLE deficits/detail RLE Deficits / Details: s/p R hip IM nail RLE: Unable to fully assess due to pain RLE Coordination: decreased gross motor, decreased fine motor    ADLs  Overall ADL's : Needs assistance/impaired Eating/Feeding: Set up, Sitting Grooming: Set up, Sitting Upper Body Bathing: Minimal assistance, Sitting Lower Body Bathing: Maximal assistance, Sit to/from stand, Cueing for sequencing, Cueing for safety, +2 for safety/equipment Upper Body Dressing : Minimal assistance, Sitting Lower Body Dressing: Maximal assistance, Sit to/from stand, Cueing for sequencing, Cueing for safety, +2 for safety/equipment, +2 for physical  assistance    Mobility  Overal bed mobility: Needs Assistance Bed Mobility: Supine to Sit Supine to sit: Mod assist, +2 for safety/equipment General bed mobility comments: ModA+2 to assist RLE to EOB and trunk elevation. Once seated, pt able to scoot hips forwad with heavy reliance on BUE support due to R hip pain    Transfers  Overall transfer level: Needs assistance Equipment used: Rolling walker (2 wheeled) Transfers: Sit to/from Stand  Sit to Stand: Mod assist, +2 physical assistance General transfer comment: ModA+2 to assist trunk elevation and prevent posterior LOB when transitioning hands to RW; cues for hand placement. MinA for eccentric control into sitting    Ambulation / Gait / Stairs / Wheelchair Mobility  Ambulation/Gait Ambulation/Gait assistance: Min assist, +2 safety/equipment Gait Distance (Feet): 2 Feet Assistive device: Rolling walker (2 wheeled) Gait Pattern/deviations: Step-to pattern, Decreased weight shift to right, Antalgic, Trunk flexed General Gait Details: Took pivotal steps from bed to recliner with RW and minA+2 for balance; cues for sequencing with steps and RW Gait velocity: Decreased Gait velocity interpretation: <1.31 ft/sec, indicative of household ambulator    Posture / Balance Balance Overall balance assessment: Needs assistance Sitting balance-Leahy Scale: Fair Standing balance-Leahy Scale: Poor Standing balance comment: Reliant on UE support and external assist    Special needs/care consideration BiPAP/CPAP  N/a CPM  N/a Continuous Drip IV  N/a Dialysis n/a Life Vest  N/a Oxygen  O2 at 2 liters nasal cannula; none pta Special Bed  N/a Trach Size  N/a Wound Vac n/a Skin surgical incision; ecchymosis BUE Bowel mgmt:  Continent LBM 4/27 Bladder mgmt: foley removed 4/30; has voided since with hematuria; felt to be trauma related, BPH pta, Eliquis resumed Diabetic mgmt:  yes Behavioral consideration  N/a Chemo/radiation  N/a   Previous  Home Environment  Living Arrangements: Spouse/significant other  Lives With: Spouse Available Help at Discharge: Family, Available 24 hours/day Type of Home: House Home Layout: One level Home Access: Stairs to enter Technical brewer of Steps: 1 Bathroom Shower/Tub: Tub/shower unit, Architectural technologist: Standard Bathroom Accessibility: Yes How Accessible: Accessible via walker Home Care Services: No Additional Comments: Lives with wife. Son and grandson live next door  Discharge Living Setting Plans for Discharge Living Setting: Patient's home, Lives with (comment)(wife) Type of Home at Discharge: House Discharge Home Layout: One level Discharge Home Access: Stairs to enter Entrance Stairs-Rails: None Entrance Stairs-Number of Steps: 1 Discharge Bathroom Shower/Tub: Tub/shower unit, Curtain Discharge Bathroom Toilet: Standard Discharge Bathroom Accessibility: Yes How Accessible: Accessible via walker Does the patient have any problems obtaining your medications?: No   Daughter . Kristi , pt and wife rely on her to convey all information  patient's wedding ring, gold band, is in [facility] safe.  Social/Family/Support Systems Patient Roles: Spouse, Parent Contact Information: wife, Belenda Cruise first and then also daughter, Steffanie Dunn Anticipated Caregiver: wife , daughter and son Anticipated Caregiver's Contact Information: see above Ability/Limitations of Caregiver: none Caregiver Availability: 24/7 Discharge Plan Discussed with Primary Caregiver: Yes Is Caregiver In Agreement with Plan?: Yes Does Caregiver/Family have Issues with Lodging/Transportation while Pt is in Rehab?: No  Goals/Additional Needs Patient/Family Goal for Rehab: supervision PT, supervision to min OT Expected length of stay: ELOS 10 to 14 days Dietary Needs: patietn with poor diet pta; needs encouragement and supplements Pt/Family Agrees to Admission and willing to participate: Yes Program Orientation  Provided & Reviewed with Pt/Caregiver Including Roles  & Responsibilities: Yes  Decrease burden of Care through IP rehab admission: n/a  Possible need for SNF placement upon discharge: not anticipated  Patient Condition: I have reviewed medical records from [facility], spoken with CM, and patient, spouse and daughter. I met with patient at the bedside for inpatient rehabilitation assessment.  Patient will benefit from ongoing PT and OT, can actively participate in 3 hours of therapy a day 5 days of the week, and can make measurable gains during the admission.  Patient will also benefit  from the coordinated team approach during an Inpatient Acute Rehabilitation admission.  The patient will receive intensive therapy as well as Rehabilitation physician, nursing, social worker, and care management interventions.  Due to bladder management, bowel management, safety, skin/wound care, disease management, medication administration, pain management and patient education the patient requires 24 hour a day rehabilitation nursing.  The patient is currently mod assist with mobility and basic ADLs.  Discharge setting and therapy post discharge at home with home health is anticipated.  Patient has agreed to participate in the Acute Inpatient Rehabilitation Program and will admit today.  Preadmission Screen Completed By:  Boyette, Barbara Godwin RN MSN, 09/22/2018 11:39 AM ______________________________________________________________________   Discussed status with Dr. Swartz on  08/30/2018 at 1200 and received approval for admission today.  Admission Coordinator:  Boyette, Barbara Godwin, RN MSN, time 1200 Date 08/27/2018   Assessment/Plan: Diagnosis:right IT hip fx 1. Does the need for close, 24 hr/day Medical supervision in concert with the patient's rehab needs make it unreasonable for this patient to be served in a less intensive setting? Yes 2. Co-Morbidities requiring supervision/potential complications:  CAD, afib, htn 3. Due to bladder management, bowel management, safety, skin/wound care, disease management, medication administration, pain management and patient education, does the patient require 24 hr/day rehab nursing? Yes 4. Does the patient require coordinated care of a physician, rehab nurse, PT (1-2 hrs/day, 5 days/week) and OT (1-2 hrs/day, 5 days/week) to address physical and functional deficits in the context of the above medical diagnosis(es)? Yes Addressing deficits in the following areas: balance, endurance, locomotion, strength, transferring, bowel/bladder control, bathing, dressing, feeding, grooming, toileting and psychosocial support 5. Can the patient actively participate in an intensive therapy program of at least 3 hrs of therapy 5 days a week? Yes 6. The potential for patient to make measurable gains while on inpatient rehab is excellent 7. Anticipated functional outcomes upon discharge from inpatients are: supervision PT, supervision and min assist OT, n/a SLP 8. Estimated rehab length of stay to reach the above functional goals is: 10-14 days 9. Anticipated D/C setting: Home 10. Anticipated post D/C treatments: HH therapy 11. Overall Rehab/Functional Prognosis: excellent  MD Signature: Zachary T. Swartz, MD, FAAPMR [facility] 09/10/2018  

## 2018-08-24 ENCOUNTER — Inpatient Hospital Stay (HOSPITAL_COMMUNITY): Payer: PRIVATE HEALTH INSURANCE | Admitting: Occupational Therapy

## 2018-08-24 ENCOUNTER — Inpatient Hospital Stay (HOSPITAL_COMMUNITY): Payer: PRIVATE HEALTH INSURANCE | Admitting: Physical Therapy

## 2018-08-24 DIAGNOSIS — S72001A Fracture of unspecified part of neck of right femur, initial encounter for closed fracture: Secondary | ICD-10-CM

## 2018-08-24 DIAGNOSIS — N183 Chronic kidney disease, stage 3 unspecified: Secondary | ICD-10-CM

## 2018-08-24 DIAGNOSIS — I5032 Chronic diastolic (congestive) heart failure: Secondary | ICD-10-CM

## 2018-08-24 DIAGNOSIS — N179 Acute kidney failure, unspecified: Secondary | ICD-10-CM

## 2018-08-24 DIAGNOSIS — D62 Acute posthemorrhagic anemia: Secondary | ICD-10-CM

## 2018-08-24 DIAGNOSIS — N390 Urinary tract infection, site not specified: Secondary | ICD-10-CM

## 2018-08-24 DIAGNOSIS — E119 Type 2 diabetes mellitus without complications: Secondary | ICD-10-CM

## 2018-08-24 LAB — URINALYSIS, ROUTINE W REFLEX MICROSCOPIC
Bilirubin Urine: NEGATIVE
Glucose, UA: NEGATIVE mg/dL
Ketones, ur: NEGATIVE mg/dL
Nitrite: NEGATIVE
Protein, ur: 100 mg/dL — AB
RBC / HPF: >50 RBC/hpf — ABNORMAL HIGH (ref 0–5)
Specific Gravity, Urine: 1.015 (ref 1.005–1.030)
WBC, UA: >50 WBC/hpf — ABNORMAL HIGH (ref 0–5)
pH: 5 (ref 5.0–8.0)

## 2018-08-24 MED ORDER — CEPHALEXIN 250 MG PO CAPS
250.0000 mg | ORAL_CAPSULE | Freq: Two times a day (BID) | ORAL | Status: DC
  Administered 2018-08-24 (×2): 250 mg via ORAL
  Filled 2018-08-24 (×2): qty 1

## 2018-08-24 MED ORDER — PRO-STAT SUGAR FREE PO LIQD
30.0000 mL | Freq: Two times a day (BID) | ORAL | Status: DC
  Filled 2018-08-24 (×2): qty 30

## 2018-08-24 NOTE — Progress Notes (Signed)
[facility] PHYSICAL MEDICINE & REHABILITATION PROGRESS NOTE  Subjective/Complaints: Patient seen laying in bed this morning.  He states he slept well overnight.  He states he is ready to begin therapies.  ROS: Denies CP, shortness of breath, nausea, vomiting, diarrhea.  Objective: Vital Signs: Blood pressure 99/65, pulse 99, temperature 98.1 F (36.7 C), resp. rate 16, height 5\' 8"  (1.727 m), weight 63 kg, SpO2 92 %. No results found. Recent Labs    08/22/18 0406 08/31/2018 0148  WBC 12.0* 13.9*  HGB 8.9* 8.0*  HCT 26.3* 23.6*  PLT 173 158   Recent Labs    08/22/18 0406 08/29/2018 0148  NA 132* 133*  K 4.1 3.9  CL 100 103  CO2 19* 17*  GLUCOSE 167* 95  BUN 81* 81*  CREATININE 4.97* 4.75*  CALCIUM 8.9 8.6*    Physical Exam: BP 99/65 (BP Location: Right Arm)   Pulse 99   Temp 98.1 F (36.7 C)   Resp 16   Ht 5\' 8"  (1.727 m)   Wt 63 kg   SpO2 92%   BMI 21.10 kg/m  Constitutional: No distress . Vital signs reviewed. HENT: Normocephalic.  Atraumatic. Eyes: EOMI. No discharge. Cardiovascular: No JVD. Respiratory: Normal effort. GI: Non-distended. Musc: Right hip with tenderness and edema Neurological: Alert Follows commands.  Motor: Bilateral UE 4+/5 proximal to distal.  RLE: 2-/5 HF, KE limited by pain, ADF/PF 4/5.  LLE 2+-3-/5 hip flexion, knee extension 4/5 ankle dorsiflexion Skin: Hip incision with dressing C/D/I Psychiatric: Flat  Assessment/Plan: 1. Functional deficits secondary to right hip fracture status post IM nail which require 3+ hours per day of interdisciplinary therapy in a comprehensive inpatient rehab setting.  Physiatrist is providing close team supervision and 24 hour management of active medical problems listed below.  Physiatrist and rehab team continue to assess barriers to discharge/monitor patient progress toward functional and medical goals  Care Tool:  Bathing  Bathing activity did not occur: Safety/medical concerns            Bathing assist       Upper Body Dressing/Undressing Upper body dressing Upper body dressing/undressing activity did not occur (including orthotics): Safety/medical concerns      Upper body assist      Lower Body Dressing/Undressing Lower body dressing      What is the patient wearing?: Incontinence brief     Lower body assist Assist for lower body dressing: Maximal Assistance - Patient 25 - 49%     Toileting Toileting Toileting Activity did not occur (Clothing management and hygiene only): Safety/medical concerns  Toileting assist       Transfers Chair/bed transfer  Transfers assist           Locomotion Ambulation   Ambulation assist              Walk 10 feet activity   Assist           Walk 50 feet activity   Assist           Walk 150 feet activity   Assist           Walk 10 feet on uneven surface  activity   Assist           Wheelchair     Assist               Wheelchair 50 feet with 2 turns activity    Assist            Wheelchair  150 feet activity     Assist            Medical Problem List and Plan: 1.Decreased functional mobilitysecondary to right hip intertrochanteric hip fracture. Status post IM nailing 08/21/2018. Weightbearing as tolerated  Begin CIR  Notes reviewed- fall resulting in right hip fracture, status post IM nail.  Labs reviewed. 2. Antithrombotics: -DVT/anticoagulation:Eliquis -antiplatelet therapy: N/A 3. Pain Management:Hydrocodone and Robaxin as needed -limit narcotics as possible due to neurosedation 4. Mood:Provide emotional support -antipsychotic agents: N/A 5. Neuropsych: This patientis?  Fully capable of making decisions on hisown behalf. 6. Skin/Wound Care:Routine skin checks 7. Fluids/Electrolytes/Nutrition:Routine in and outs 8.Acute blood loss anemia.   Hemoglobin 8.0 on 5/1  Labs ordered for  Monday 9.Acute on chronic CKD stage III. Baseline creatinine 1.63.   Creatinine 4.75 on 5/1  Labs ordered for Monday 10.Chronic diastolic congestive heart failure. Monitor for any signs of fluid overload. Lasix 40 mg twice daily currently on hold due to elevated creatinine Filed Weights   09/11/2018 1747 09/09/2018 0522  Weight: 65 kg 63 kg  11.Chronic atrial fibrillation. Lopressor 25 mg p.o. twice daily, amiodarone 200 mg daily. Cardiac rate controlled. Follow cardiology services 12. CAD with CABG. No chest pain or shortness of breath. Continue Imdur 13. History of AAA with repair. Follow-up CTA 6 months 14. Diabetes mellitus. Latest hemoglobin A1c 6.3. Glucophage held due to elevated creatinine. Patient on Amaryl 1 mg daily, Glucophage 500 mg twice daily prior to admission  Monitor with increased mobility 15. Hypothyroidism. Synthroid 16.Constipation. Laxative assistance 17.  Acute lower UTI  Discussed with PA hematuria- possibly related to UTI  UA ?+, Urine culture pending  Impaired kidney function precludes Macrobid, Keflex started on 5/2- documented allergic reaction, unknown drug reaction, will need to monitor closely, particularly if anaphylaxis  LOS: 1 days A FACE TO FACE EVALUATION WAS PERFORMED  Kairyn Olmeda Karis Juba 09/14/2018, 8:48 AM

## 2018-08-24 NOTE — Evaluation (Signed)
Physical Therapy Assessment and Plan  Patient Details  Name: Elijah Saunders MRN: [id number] Date of Birth: 09-09-1933  PT Diagnosis: Abnormal posture, Abnormality of gait, Difficulty walking, Muscle weakness and Pain in R LE Rehab Potential: Good ELOS: 2.5 weeks   Today's Date: 08/30/2018 PT Individual Time: 2542-7062 PT Individual Time Calculation (min): 53 min    Problem List:  Patient Active Problem List   Diagnosis Date Noted  . Hip fracture ([place]) 09/19/2018  . Malnutrition of moderate degree 08/22/2018  . Closed displaced intertrochanteric fracture of right femur ([place]) 08/19/2018  . Closed left hip fracture, initial encounter ([place]) 08/19/2018  . Near syncope 12/14/2016  . Orthostatic hypotension 12/30/2015  . Acute kidney failure ([place]) 09/08/2015  . Dyspnea   . Chronic renal disease, stage III ([place]) 09/06/2015  . Type 2 diabetes mellitus with renal manifestations, controlled ([place]) 09/06/2015  . Cardiomyopathy, ischemic-EF 35-40% 09/06/2015  . ACE inhibitor intolerance 09/06/2015  . Persistent atrial fibrillation 09/06/2015  . Emphysema, interstitial ([place]) 09/06/2015  . S/P AAA repair 2003 02/13/2011  . Chronic systolic CHF (congestive heart failure) ([place]) 02/13/2011  . Hx of CABG x 4 2003 08/16/2010  . Hyperlipidemia-statin intol 08/16/2010    Past Medical History:  Past Medical History:  Diagnosis Date  . AAA (abdominal aortic aneurysm) [facility]) 2003   s/p AAA R&G June 2003  . CAD (coronary artery disease) 2003   s/p CABG, 2003   . CHF (congestive heart failure) ([place])    EF 35-40% by echo, May 2017  . Chronic renal insufficiency, stage III (moderate) ([facility])   . HTN (hypertension)   . Hypercholesteremia    statin intol  . MI (myocardial infarction) ([place])   . SBO (small bowel obstruction) [facility]) Nov 2002   Past Surgical History:  Past Surgical History:  Procedure Laterality Date  . ABDOMINAL AORTIC ANEURYSM REPAIR  June 2003  . CARDIOVERSION N/A 09/08/2015    Procedure: CARDIOVERSION;  Surgeon: Larey Dresser, MD;  Location: [facility] ENDOSCOPY;  Service: Cardiovascular;  Laterality: N/A;  . CARDIOVERSION N/A 03/20/2018   Procedure: CARDIOVERSION;  Surgeon: Thayer Headings, MD;  Location: [place];  Service: Cardiovascular;  Laterality: N/A;  . CORONARY ARTERY BYPASS GRAFT  March 2003   x 4  . HERNIA REPAIR    . INTRAMEDULLARY (IM) NAIL INTERTROCHANTERIC Right 08/21/2018   Procedure: INTRAMEDULLARY (IM) NAIL INTERTROCHANTRIC;  Surgeon: Leandrew Koyanagi, MD;  Location: [place];  Service: Orthopedics;  Laterality: Right;  . NEPHRECTOMY Left    1955  . RIGHT/LEFT HEART CATH AND CORONARY/GRAFT ANGIOGRAPHY N/A 12/19/2016   Procedure: RIGHT/LEFT HEART CATH AND CORONARY/GRAFT ANGIOGRAPHY;  Surgeon: Belva Crome, MD;  Location: [place] CV LAB;  Service: Cardiovascular;  Laterality: N/A;  . TEE WITHOUT CARDIOVERSION N/A 09/08/2015   Procedure: TRANSESOPHAGEAL ECHOCARDIOGRAM (TEE);  Surgeon: Larey Dresser, MD;  Location: [facility] ENDOSCOPY;  Service: Cardiovascular;  Laterality: N/A;    Assessment & Plan Clinical Impression: Patient is a 85 y.o. year old male with history of diastolic congestive heart failure with ejection fraction of 40%, AAA status post repair , CAD with CABG 2003, hypertension, hyperlipidemia, atrial fibrillation maintained on amiodarone as well as Eliquis, CKD stage III with left nephrectomy 1955 and type 2 diabetes mellitus. Per chart review patient lives with spouse. Independent with ambulation prior to admission and active. One level home with one-step to entry. Son and grandson also lives next door can assist as needed. Presented 08/19/2018 after mechanical fall while trying to get out  of a chair. No loss of consciousness. Cranial CT scan as well as CT cervical spine negative for acute findings. There was noted small vessel ischemia of the brain with age-indeterminate bilateral basal ganglia lacunar infarcts. X-rays and imaging revealed a  nondisplaced intertrochanteric right hip fracture. Noted elevated creatinine 6.18 with BUN 109 from baseline 1.63.CT renal stone study negative for acute findings. No evidence of ureteral calculi or hydronephrosis. 4.7 suprarenal abdominal aortic aneurysm as also noted 2017with recommendations of pelvis CTA 6 months and vascular surgery follow-up. Cardiology consulted in regards to atrial fibrillation and Eliquis was held. Patient was cleared for surgery and underwent IM nailing 08/21/2018 per Dr.Xu.Hospital course pain management weightbearing as tolerated. Patient was cleared to resume Eliquis 08/22/2018.Acute blood loss anemia 8.0. Creatinine slowly improving 4.75with monitoring of hydration. Patient's home Lasix had been held due to elevated creatinine with strict INO's. Patient's metformin placed on hold due to elevated creatinine. Therapy evaluations completed and patient was admitted for a comprehensive rehab program. Patient transferred to CIR on 09/07/2018 .   Patient currently requires max with mobility secondary to muscle weakness, decreased cardiorespiratoy endurance, decreased motor planning, decreased awareness and delayed processing and decreased sitting balance, decreased standing balance, decreased postural control and decreased balance strategies.  Prior to hospitalization, patient was independent  with mobility and lived with Spouse in a House home.  Home access is 1Stairs to enter.  Patient will benefit from skilled PT intervention to maximize safe functional mobility, minimize fall risk and decrease caregiver burden for planned discharge home with 24 hour supervision.  Anticipate patient will benefit from follow up [place] at discharge.  PT - End of Session Activity Tolerance: Tolerates 10 - 20 min activity with multiple rests Endurance Deficit: Yes Endurance Deficit Description: lethargic during session, easliy falling asleep PT Assessment Rehab Potential (ACUTE/IP ONLY):  Good PT Barriers to Discharge: Lack of/limited family support PT Patient demonstrates impairments in the following area(s): Balance;Nutrition;Skin Integrity;Pain;Endurance;Motor;Sensory PT Transfers Functional Problem(s): Bed to Chair;Bed Mobility;Car;Furniture PT Locomotion Functional Problem(s): Ambulation;Wheelchair Mobility;Stairs PT Plan PT Intensity: Minimum of 1-2 x/day ,45 to 90 minutes PT Frequency: 5 out of 7 days PT Duration Estimated Length of Stay: 2.5 weeks PT Treatment/Interventions: Ambulation/gait training;Cognitive remediation/compensation;Discharge planning;DME/adaptive equipment instruction;Functional mobility training;Pain management;Psychosocial support;Splinting/orthotics;Therapeutic Activities;UE/LE Strength taining/ROM;Balance/vestibular training;Community reintegration;Disease management/prevention;Functional electrical stimulation;Neuromuscular re-education;Patient/family education;Skin care/wound management;Stair training;Therapeutic Exercise;UE/LE Coordination activities;Wheelchair propulsion/positioning PT Transfers Anticipated Outcome(s): supervision transfers PT Locomotion Anticipated Outcome(s): supervision ambulation with LRAD PT Recommendation Recommendations for Other Services: Therapeutic Recreation consult;Neuropsych consult Therapeutic Recreation Interventions: Pet therapy;Kitchen group;Stress management;Outing/community reintergration Follow Up Recommendations: Home health PT;24 hour supervision/assistance Patient destination: Home Equipment Recommended: To be determined Equipment Details: pt has none  Skilled Therapeutic Intervention Evaluation completed (see details above and below) with education on PT POC and goals and individual treatment initiated with focus on bed mobility, bed<>chair transfers, sit<>stand transfers, gait initiation, and activity tolerance. Pt received asleep, sitting in recliner and reports he is overall "just not feeling well",  unable to clarify further despite questioning, but agreeable to therapy session stating "I have to if I want to get better." Pt remained lethargic during therapy session requiring increased stimulus to prevent returning to sleep. Pt reporting his buttocks are hurting from sitting in recliner and requesting to stand. Pt performed sit<>stand from recliner/w/c/EOM to RW throughout session with mod assist for lifting with cuing for increased anterior trunk weight shift to come to standing and increased trunk extension for improved upright in stance. Pt performed stand pivot transfer recliner to  w/c using RW with mod assist for balance and sequential cuing for sequencing of LE stepping and AD management. Pt transported in w/c to/from therapy gym. Pt ambulated ~41f using RW with min assist for balance and cuing for sequential stepping, upright posture, and AD management with pt demonstrating decreased weightbearing through R LE with increased B UE support on AD, decreased B LE step length, and decreased gait speed. Pt performed stand pivot transfer EOM to w/c using RW with mod assist for lifting and min assist for balance during transfer. Therapist provided w/c cushion for improved pressure relief and increased pt tolerance to upright sitting. Performed stand pivot transfer w/c to EOB using RW with mod assist for lifting and min assist for balance during transfer. Sit to supine with max assist for B LE management. Attempted to engage pt in R LE heel slides for hip strengthening; however, pt quickly falling asleep. Pt left supine in bed with needs in reach and bed alarm on.   PT Evaluation Precautions/Restrictions Precautions Precautions: Fall Restrictions Weight Bearing Restrictions: Yes RLE Weight Bearing: Weight bearing as tolerated Pain Pain Assessment Pain Scale: 0-10 Pain Score: 6  Pain Type: Acute pain Pain Location: Buttocks Pain Descriptors / Indicators: Other (Comment)(reports it is uncomfortable  from where he has been sitting in recliner) Pain Onset: Gradual Pain Intervention(s): Other (Comment);Repositioned(therapy to tolerance) Multiple Pain Sites: Yes(pt reported R leg pain during standing activity) 2nd Pain Site Wong-Baker Pain Rating: Hurts even more Pain Type: Surgical pain;Acute pain Pain Location: Leg Pain Orientation: Right Pain Onset: With Activity Pain Intervention(s): Other (Comment);Rest;Repositioned(therapy to tolerance) Home Living/Prior Functioning Home Living Available Help at Discharge: Family;Available 24 hours/day Type of Home: House Home Access: Stairs to enter [employer] Steps: 1 Home Layout: One level Bathroom Shower/Tub: TProduct/process development scientist Standard Bathroom Accessibility: Yes Additional Comments: Lives with wife. Son and grandson live next door  Lives With: Spouse(wife is 81y.o.) Prior Function Level of Independence: Independent with gait;Independent with transfers;Independent with homemaking with ambulation  Able to Take Stairs?: Yes Driving: Yes Vocation: Retired Leisure: Hobbies-yes (Comment) Comments: enjoys gardening Vision/Perception  Perception Perception: Within Functional Limits Praxis Praxis: Intact  Cognition Overall Cognitive Status: No family/caregiver present to determine baseline cognitive functioning(difficult to determine due to lethargy) Arousal/Alertness: Lethargic(pt quick to fall asleep despite verbal stimulus) Orientation Level: Oriented to person;Oriented to place;Other (comment);Disoriented to time;Oriented to situation(oriented to month but not day or year with pt stating "I don't usually keep up with all of that") Attention: Focused;Sustained Focused Attention: Appears intact Sustained Attention: (difficult to assess 2/2 lethargy) Awareness: Impaired(states "I could lay down here" referring to mat table then stating "well, no I probably shouldn't because I won't have a place to  put my head") Safety/Judgment: Appears intact Sensation Sensation Light Touch: Appears Intact Hot/Cold: Not tested Proprioception: Not tested Stereognosis: Not tested Coordination Gross Motor Movements are Fluid and Coordinated: No Coordination and Movement Description: gross motor movements impaired due to R LE pain and weakness Motor  Motor Motor: Other (comment) Motor - Skilled Clinical Observations: generalized weakness  Mobility Bed Mobility Bed Mobility: Sit to Supine Sit to Supine: Maximal Assistance - Patient 25-49% Transfers Transfers: Sit to Stand;Stand to Sit;Stand Pivot Transfers Sit to Stand: Moderate Assistance - Patient 50-74% Stand to Sit: Minimal Assistance - Patient > 75% Stand Pivot Transfers: Moderate Assistance - Patient 50 - 74%;Minimal Assistance - Patient > 75% Stand Pivot Transfer Details: Verbal cues for sequencing;Verbal cues for technique;Visual cues/gestures for sequencing;Visual cues/gestures for precautions/safety;Verbal  cues for safe use of DME/AE;Manual facilitation for placement Transfer (Assistive device): Rolling walker Locomotion  Gait Ambulation: Yes Gait Assistance: Moderate Assistance - Patient 50-74%;Minimal Assistance - Patient > 75% Gait Distance (Feet): 7 Feet Assistive device: Rolling walker Gait Assistance Details: Verbal cues for safe use of DME/AE;Verbal cues for gait pattern;Verbal cues for precautions/safety;Verbal cues for technique;Verbal cues for sequencing;Tactile cues for weight shifting Gait Gait: Yes Gait Pattern: Impaired Gait Pattern: Antalgic;Decreased step length - right;Decreased step length - left;Decreased stance time - right;Decreased weight shift to right(increased weightbearing through B UEs on RW during R stance phase) Gait velocity: Decreased Stairs / Additional Locomotion Stairs: No Wheelchair Mobility Wheelchair Mobility: No  Trunk/Postural Assessment  Cervical Assessment Cervical Assessment: Exceptions  to WFL(forward head) Thoracic Assessment Thoracic Assessment: Exceptions to WFL(thoracic kyphosis) Lumbar Assessment Lumbar Assessment: Exceptions to WFL(posterior pelvic tilt) Postural Control Postural Control: Deficits on evaluation Righting Reactions: impaired Protective Responses: impaired Postural Limitations: decreased  Balance Balance Balance Assessed: Yes Static Sitting Balance Static Sitting - Balance Support: Feet supported Static Sitting - Level of Assistance: 5: Stand by assistance Dynamic Sitting Balance Dynamic Sitting - Balance Support: Feet supported;During functional activity Dynamic Sitting - Level of Assistance: 4: Min assist Static Standing Balance Static Standing - Balance Support: During functional activity;Bilateral upper extremity supported Static Standing - Level of Assistance: 5: Stand by assistance;4: Min assist Dynamic Standing Balance Dynamic Standing - Balance Support: During functional activity;Bilateral upper extremity supported Dynamic Standing - Level of Assistance: 4: Min assist;3: Mod assist Extremity Assessment      RLE Assessment RLE Assessment: Exceptions to [facility] RLE Strength Right Hip Flexion: 2/5(impaired due to pain and weakness) Right Knee Flexion: 3/5(impaired due to hip pain and weakness) Right Knee Extension: 3/5(impaired due to hip pain and weakness) Right Ankle Dorsiflexion: 3/5 Right Ankle Plantar Flexion: 3/5 LLE Assessment LLE Assessment: Exceptions to [facility] LLE Strength Left Hip Flexion: 3/5 Left Knee Flexion: 3/5 Left Knee Extension: 3/5 Left Ankle Dorsiflexion: 3/5 Left Ankle Plantar Flexion: 3/5    Refer to Care Plan for Long Term Goals  Recommendations for other services: Neuropsych and Therapeutic Recreation  Pet therapy, Kitchen group, Stress management and Outing/community reintegration  Discharge Criteria: Patient will be discharged from PT if patient refuses treatment 3 consecutive times without medical  reason, if treatment goals not met, if there is a change in medical status, if patient makes no progress towards goals or if patient is discharged from hospital.  The above assessment, treatment plan, treatment alternatives and goals were discussed and mutually agreed upon: by patient  Tawana Scale, PT,DPT 09/18/2018, 7:55 AM

## 2018-08-24 NOTE — Evaluation (Signed)
Occupational Therapy Assessment and Plan  Patient Details  Name: Elijah Saunders MRN: [id number] Date of Birth: 25-Mar-1934  OT Diagnosis: abnormal posture, acute pain and muscle weakness (generalized) Rehab Potential: Rehab Potential (ACUTE ONLY): Good ELOS: 14-17 days   Today's Date: 09/01/2018 OT Individual Time: 0700-0800 and 2706-2376 OT Individual Time Calculation (min): 60 min  And 10 mins  Today's Date: 08/26/2018 OT Missed Time: 50 Minutes Missed Time Reason: Patient fatigue    Problem List:  Patient Active Problem List   Diagnosis Date Noted  . Hip fracture ([place]) 09/18/2018  . Malnutrition of moderate degree 08/22/2018  . Closed displaced intertrochanteric fracture of right femur ([place]) 08/19/2018  . Closed left hip fracture, initial encounter ([place]) 08/19/2018  . Near syncope 12/14/2016  . Orthostatic hypotension 12/30/2015  . Acute kidney failure ([place]) 09/08/2015  . Dyspnea   . Chronic renal disease, stage III ([place]) 09/06/2015  . Type 2 diabetes mellitus with renal manifestations, controlled ([place]) 09/06/2015  . Cardiomyopathy, ischemic-EF 35-40% 09/06/2015  . ACE inhibitor intolerance 09/06/2015  . Persistent atrial fibrillation 09/06/2015  . Emphysema, interstitial ([place]) 09/06/2015  . S/P AAA repair 2003 02/13/2011  . Chronic systolic CHF (congestive heart failure) ([place]) 02/13/2011  . Hx of CABG x 4 2003 08/16/2010  . Hyperlipidemia-statin intol 08/16/2010    Past Medical History:  Past Medical History:  Diagnosis Date  . AAA (abdominal aortic aneurysm) [facility]) 2003   s/p AAA R&G June 2003  . CAD (coronary artery disease) 2003   s/p CABG, 2003   . CHF (congestive heart failure) ([place])    EF 35-40% by echo, May 2017  . Chronic renal insufficiency, stage III (moderate) (H[facility])   . HTN (hypertension)   . Hypercholesteremia    statin intol  . MI (myocardial infarction) ([place])   . SBO (small bowel obstruction) [facility]) Nov 2002   Past Surgical History:  Past Surgical  History:  Procedure Laterality Date  . ABDOMINAL AORTIC ANEURYSM REPAIR  June 2003  . CARDIOVERSION N/A 09/08/2015   Procedure: CARDIOVERSION;  Surgeon: Larey Dresser, MD;  Location: [facility] ENDOSCOPY;  Service: Cardiovascular;  Laterality: N/A;  . CARDIOVERSION N/A 03/20/2018   Procedure: CARDIOVERSION;  Surgeon: Thayer Headings, MD;  Location: [place];  Service: Cardiovascular;  Laterality: N/A;  . CORONARY ARTERY BYPASS GRAFT  March 2003   x 4  . HERNIA REPAIR    . INTRAMEDULLARY (IM) NAIL INTERTROCHANTERIC Right 08/21/2018   Procedure: INTRAMEDULLARY (IM) NAIL INTERTROCHANTRIC;  Surgeon: Leandrew Koyanagi, MD;  Location: [place];  Service: Orthopedics;  Laterality: Right;  . NEPHRECTOMY Left    1955  . RIGHT/LEFT HEART CATH AND CORONARY/GRAFT ANGIOGRAPHY N/A 12/19/2016   Procedure: RIGHT/LEFT HEART CATH AND CORONARY/GRAFT ANGIOGRAPHY;  Surgeon: Belva Crome, MD;  Location: [place] CV LAB;  Service: Cardiovascular;  Laterality: N/A;  . TEE WITHOUT CARDIOVERSION N/A 09/08/2015   Procedure: TRANSESOPHAGEAL ECHOCARDIOGRAM (TEE);  Surgeon: Larey Dresser, MD;  Location: [facility] ENDOSCOPY;  Service: Cardiovascular;  Laterality: N/A;    Assessment & Plan Clinical Impression: Patient is a 85 y.o. year old male with history of diastolic congestive heart failure with ejection fraction of 40%, AAA status post repair , CAD with CABG 2003, hypertension, hyperlipidemia, atrial fibrillation maintained on amiodarone as well as Eliquis, CKD stage III with left nephrectomy 1955 and type 2 diabetes mellitus. Per chart review patient lives with spouse. Independent with ambulation prior to admission and active. One level home with one-step to entry. Son and grandson  also lives next door can assist as needed. Presented 08/19/2018 after mechanical fall while trying to get out of a chair. No loss of consciousness. Cranial CT scan as well as CT cervical spine negative for acute findings. There was noted small  vessel ischemia of the brain with age-indeterminate bilateral basal ganglia lacunar infarcts. X-rays and imaging revealed a nondisplaced intertrochanteric right hip fracture. Noted elevated creatinine 6.18 with BUN 109 from baseline 1.63.CT renal stone study negative for acute findings. No evidence of ureteral calculi or hydronephrosis. 4.7 suprarenal abdominal aortic aneurysm as also noted 2017with recommendations of pelvis CTA 6 months and vascular surgery follow-up. Cardiology consulted in regards to atrial fibrillation and Eliquis was held. Patient was cleared for surgery and underwent IM nailing 08/21/2018 per Dr.Xu.Hospital course pain management weightbearing as tolerated. Patient was cleared to resume Eliquis 08/22/2018.Acute blood loss anemia 8.0. Creatinine slowly improving 4.75with monitoring of hydration. Patient's home Lasix had been held due to elevated creatinine with strict INO's. Patient's metformin placed on hold due to elevated creatinine. Therapy evaluations completed and patient was admitted for a comprehensive rehab program. .  Patient transferred to CIR on 09/15/2018 .    Patient currently requires max with basic self-care skills secondary to muscle weakness, decreased cardiorespiratoy endurance, decreased initiation, decreased attention, decreased problem solving, decreased safety awareness and delayed processing and decreased sitting balance, decreased standing balance and decreased balance strategies.  Prior to hospitalization, patient could complete ADls and IADLs with independent .  Patient will benefit from skilled intervention to increase independence with basic self-care skills prior to discharge home with care partner.  Anticipate patient will require 24 hour supervision and follow up home health.  OT - End of Session Activity Tolerance: Decreased this session Endurance Deficit: Yes Endurance Deficit Description: increased pain limited pt during evaluation OT  Assessment Rehab Potential (ACUTE ONLY): Good OT Barriers to Discharge: Other (comments) OT Barriers to Discharge Comments: caregiver is in her 34's OT Patient demonstrates impairments in the following area(s): Warehouse manager;Behavior;Cognition;Endurance;Motor;Pain;Safety OT Basic ADL's Functional Problem(s): Grooming;Bathing;Dressing;Toileting OT Transfers Functional Problem(s): Toilet;Tub/Shower OT Additional Impairment(s): None OT Plan OT Intensity: Minimum of 1-2 x/day, 45 to 90 minutes OT Frequency: 5 out of 7 days OT Duration/Estimated Length of Stay: 14-17 days OT Treatment/Interventions: Balance/vestibular training;Self Care/advanced ADL retraining;Therapeutic Exercise;Wheelchair propulsion/positioning;Cognitive remediation/compensation;Pain management;DME/adaptive equipment instruction;UE/LE Strength taining/ROM;Community reintegration;Patient/family education;UE/LE Coordination activities;Discharge planning;Functional mobility training;Psychosocial support;Therapeutic Activities OT Self Feeding Anticipated Outcome(s): n/a OT Basic Self-Care Anticipated Outcome(s): supervision OT Toileting Anticipated Outcome(s): supervision OT Bathroom Transfers Anticipated Outcome(s): supervision OT Recommendation Recommendations for Other Services: Neuropsych consult Patient destination: Home Follow Up Recommendations: Home health OT;24 hour supervision/assistance Equipment Recommended: To be determined   Skilled Therapeutic Intervention Session 1: Upon entering the room, pt supine in bed with no c/o pain. OT educated pt on OT purpose, POC, and goals with pt verbalizing understanding and agreement. OT assisted pt with donning TED hose with total A. Supine>sit with max A to EOB. Pt reporting 10/10 pain in R hip. RN notified and medication given this session. Pt donning pull over shirt with max A . LB clothing donned with max A and max cuing for sequencing and initiation. Pt needing  increased time to process information this session. Pt standing with max A for lifting and lowering and taking 3 small steps to recliner chair. Pt seated in recliner chair with breakfast tray set up in front of him and pt just staring at food and not making any attempts to eat. OT educated pt on  importance of eating with medication. Pt verbalized understanding. Chair alarm donned and call bell within reach upon exiting the room.   Session 2: Upon entering the room, pt supine in bed and sleeping soundly. Nurse reports pt has been nauseated but has not had any pain medication since 0700. However, pt is very lethargic and unable to participate in therapy this session. OT assisted with repositioning. Bed alarm activated and call bell within reach upon exiting the room.   OT Evaluation Precautions/Restrictions  Precautions Precautions: Fall Restrictions Weight Bearing Restrictions: Yes RLE Weight Bearing: Weight bearing as tolerated General   Vital Signs Therapy Vitals Temp: 98.1 F (36.7 C) Pulse Rate: 99 Resp: 16 BP: 99/65 Patient Position (if appropriate): Lying Oxygen Therapy SpO2: 92 % O2 Device: Room Air Pain Pain Assessment Pain Scale: 0-10 Pain Score: 10-Worst pain ever Pain Type: Surgical pain Pain Location: Hip Pain Orientation: Right Pain Descriptors / Indicators: Aching;Discomfort;Grimacing;Guarding Pain Frequency: Intermittent Pain Onset: On-going Patients Stated Pain Goal: 5 Pain Intervention(s): Medication (See eMAR);Repositioned Multiple Pain Sites: No Home Living/Prior [place] expects to be discharged to:: Private residence Living Arrangements: Spouse/significant other Available Help at Discharge: Family, Available 24 hours/day Type of Home: House Home Access: Stairs to enter [employer] of Steps: 1 Home Layout: One level Bathroom Shower/Tub: Tub/shower unit, Architectural technologist: Standard Bathroom Accessibility:  Yes Additional Comments: Lives with wife. Son and grandson live next door  Lives With: Spouse Prior Function Level of Independence: Independent with basic ADLs, Independent with homemaking with ambulation, Independent with gait, Independent with transfers Comments: Reports indep with ambulation although knees bother him. Remains active, enjoys gardening. Does not wear home O2 Vision Baseline Vision/History: Wears glasses Wears Glasses: At all times Patient Visual Report: No change from baseline Vision Assessment?: No apparent visual deficits Cognition Overall Cognitive Status: Within Functional Limits for tasks assessed Arousal/Alertness: Awake/alert Orientation Level: Person;Place;Situation Person: Oriented Place: Oriented Situation: Oriented Year: 2020 Month: May Day of Week: Correct Memory: Appears intact Immediate Memory Recall: Sock;Blue;Bed Memory Recall: Sock;Blue(2/3) Memory Recall Sock: Without Cue Memory Recall Blue: Without Cue Sensation Sensation Light Touch: Appears Intact Hot/Cold: Not tested Proprioception: Not tested Stereognosis: Not tested Coordination Gross Motor Movements are Fluid and Coordinated: No Fine Motor Movements are Fluid and Coordinated: No Motor  Motor Motor: Other (comment) Motor - Skilled Clinical Observations: generalized weakness Mobility  Bed Mobility Bed Mobility: Sit to Supine Sit to Supine: Maximal Assistance - Patient 25-49% Transfers Sit to Stand: Moderate Assistance - Patient 50-74% Stand to Sit: Minimal Assistance - Patient > 75%  Trunk/Postural Assessment  Cervical Assessment Cervical Assessment: Exceptions to WFL(forward head) Thoracic Assessment Thoracic Assessment: Exceptions to [facility] Lumbar Assessment Lumbar Assessment: Exceptions to WFL(posterior pelvic tilt) Postural Control Postural Control: Deficits on evaluation Righting Reactions: impaired Protective Responses: impaired Postural Limitations: decreased   Balance Balance Balance Assessed: Yes Static Sitting Balance Static Sitting - Balance Support: Feet supported Static Sitting - Level of Assistance: 5: Stand by assistance Dynamic Sitting Balance Dynamic Sitting - Balance Support: Feet supported;During functional activity Dynamic Sitting - Level of Assistance: 4: Min assist Static Standing Balance Static Standing - Balance Support: During functional activity;Bilateral upper extremity supported Static Standing - Level of Assistance: 5: Stand by assistance;4: Min assist Dynamic Standing Balance Dynamic Standing - Balance Support: During functional activity;Bilateral upper extremity supported Dynamic Standing - Level of Assistance: 4: Min assist;3: Mod assist Extremity/Trunk Assessment RUE Assessment RUE Assessment: Within Functional Limits LUE Assessment LUE Assessment: Within Functional Limits  Refer to Care Plan for Long Term Goals  Recommendations for other services: Neuropsych   Discharge Criteria: Patient will be discharged from OT if patient refuses treatment 3 consecutive times without medical reason, if treatment goals not met, if there is a change in medical status, if patient makes no progress towards goals or if patient is discharged from hospital.  The above assessment, treatment plan, treatment alternatives and goals were discussed and mutually agreed upon: by patient  Gypsy Decant 09/05/2018, 7:52 AM

## 2018-08-24 NOTE — Progress Notes (Signed)
Patient given norco 5/325 mg po for c/o right shoulder pain-patient noted to be sleeping upon reassessment. Given miralax po at 0408 for constipation-pending results.  In and out cath performed for no void x 8 hours at 0230, hematuria noted. Urine specimen sent for u/a and culture.

## 2018-08-24 NOTE — Progress Notes (Addendum)
Initial Nutrition Assessment RD working remotely.  DOCUMENTATION CODES:   Non-severe (moderate) malnutrition in context of chronic illness  INTERVENTION:    Ensure Enlive po TID, each supplement provides 350 kcal and 20 grams of protein   Pro-stat 30 ml BID, each supplement provides 100 kcal and 15 gm protein   MVI daily   Consider downgrading diet to dysphagia 2 if intake does not improve.   NUTRITION DIAGNOSIS:   Moderate Malnutrition related to chronic illness(CHF) as evidenced by energy intake < or equal to 75% for > or equal to 1 month, mild muscle depletion, mild fat depletion, percent weight loss(11% weight loss within 6 months).  GOAL:   Patient will meet greater than or equal to 90% of their needs  MONITOR:   PO intake, Supplement acceptance, Labs, Skin  REASON FOR ASSESSMENT:   Malnutrition Screening Tool    ASSESSMENT:   85 yo male with PMH of CAD, CHF, CRI, HLD, and HTN who was admitted to CIR from inpatient side of [facility] S/P repair of right hip fracture.  Patient reports ongoing poor intake. He consumed 25% of breakfast today. He is being offered Ensure Enlive TID, drinks some of it. Didn't really like the dysphagia 2 diet.   Diet was downgraded to dysphagia 2 during inpatient admission due to patient not having his dentures.   Patient with 11.2% weight loss within 6 months PTA.   Patient with ongoing moderate malnutrition related to chronic illness (CHF) as evidenced by energy intake < or equal to 75% for > or equal to 1 month, mild fat depletion, mild muscle depletion, percent weight loss.  Pt with poor oral intake and would benefit from a nutrient dense supplement. One Ensure Enlive supplement provides 350 kcals, 20 grams protein, and 44-45 grams of carbohydrate vs one Glucerna shake supplement, which provides 220 kcals, 10 grams of protein, and 26 grams of carbohydrate. Given pt's hx of DM, RD will continue to monitor PO intake, CBGs, and adjust  supplement regimen as appropriate.  Labs reviewed. Sodium 133 (L), BUN 81 (H), creatinine 4.75 (H) CBG's: [phone] yesterday  Medications reviewed and include Colace, MVI.  NUTRITION - FOCUSED PHYSICAL EXAM (completed on 4/30)    Most Recent Value  Orbital Region  Mild depletion  Upper Arm Region  Mild depletion  Thoracic and Lumbar Region  Mild depletion  Buccal Region  Mild depletion  Temple Region  Mild depletion  Clavicle Bone Region  Mild depletion  Clavicle and Acromion Bone Region  Mild depletion  Scapular Bone Region  Mild depletion  Dorsal Hand  Mild depletion  Patellar Region  Mild depletion  Anterior Thigh Region  Mild depletion  Posterior Calf Region  Mild depletion  Edema (RD Assessment)  Mild  Hair  Reviewed  Eyes  Reviewed  Mouth  Reviewed  Skin  Reviewed  Nails  Reviewed       Diet Order:   Diet Order            DIET DYS 2 Room service appropriate? Yes; Fluid consistency: Thin  Diet effective now              EDUCATION NEEDS:   No education needs have been identified at this time  Skin:  Skin Assessment: Skin Integrity Issues: Skin Integrity Issues:: DTI, Stage I, Stage II, Incisions DTI: coccyx Stage I: R ankle  Stage II: penis and buttocks Incisions: R hip  Last BM:  4/27  Height:   Ht Readings from Last 1  Encounters:  12-16-2018 5\' 8"  (1.727 m)    Weight:   Wt Readings from Last 1 Encounters:  09/05/2018 63 kg    Ideal Body Weight:  70 kg  BMI:  Body mass index is 21.1 kg/m.  Estimated Nutritional Needs:   Kcal:  1850-2100  Protein:  85-100 gm  Fluid:  2 L    Joaquin CourtsKimberly Harris, RD, LDN, CNSC Pager [phone] After Hours Pager [phone]

## 2018-08-25 ENCOUNTER — Inpatient Hospital Stay (HOSPITAL_COMMUNITY): Admission: AD | Admit: 2018-08-25 | Payer: PRIVATE HEALTH INSURANCE | Admitting: Pulmonary Disease

## 2018-08-25 LAB — URINE CULTURE: Culture: NO GROWTH

## 2018-08-26 ENCOUNTER — Inpatient Hospital Stay (HOSPITAL_COMMUNITY): Payer: PRIVATE HEALTH INSURANCE

## 2018-08-26 ENCOUNTER — Inpatient Hospital Stay (HOSPITAL_COMMUNITY): Payer: PRIVATE HEALTH INSURANCE | Admitting: Occupational Therapy

## 2018-08-26 LAB — GLUCOSE, CAPILLARY
Glucose-Capillary: 124 mg/dL — ABNORMAL HIGH (ref 70–99)
Glucose-Capillary: 113 mg/dL — ABNORMAL HIGH (ref 70–99)
Glucose-Capillary: 117 mg/dL — ABNORMAL HIGH (ref 70–99)
Glucose-Capillary: 126 mg/dL — ABNORMAL HIGH (ref 70–99)
Glucose-Capillary: 93 mg/dL (ref 70–99)
Glucose-Capillary: 115 mg/dL — ABNORMAL HIGH (ref 70–99)

## 2018-08-26 MED FILL — Medication: Qty: 1 | Status: AC

## 2018-09-23 NOTE — Progress Notes (Signed)
Patients belongings including his wedding ring was returned to family when they arrived to view patient. Dentures were sent with patient to morgue at [phone]. Kalman Shan, LPN

## 2018-09-23 NOTE — Progress Notes (Signed)
Waiting for family to arrival; body prepared by staff, medical examiner and [facility] Service notified.

## 2018-09-23 NOTE — Progress Notes (Addendum)
At around 0723 this writer was rounding on patients after receiving report. Patient was noted with head of bed elevated  above 30 degrees face was noted pale glasses in place and was holding the side railings on the bed. Patient wasn't responsive to verbal stimulation, or sternal rub. At that point a code blue was called. This writer immediately  started compressions. Compressions were taking over by staff. Rapid took over the code and this Clinical research associate removed from room. Code was cancelled at 0743 and family was informed of death by Ac. [place] A Amila Callies, Lpn

## 2018-09-23 NOTE — Code Documentation (Signed)
  Patient Name: ASLAN BURDITT   MRN: [id number]   Date of Birth/ Sex: 1933-09-21 , male      Admission Date: 09/05/2018  Attending Provider: Erick Colace, MD  Primary Diagnosis: hip fx   Indication: Pt was in his usual state of health until this AM, when he was noted to be unresponsive and pulseless. Code blue was subsequently called. At the time of arrival on scene, ACLS protocol was underway.   Technical Description:  - CPR performance duration:  20 minutes  - Was defibrillation or cardioversion used? No   - Was external pacer placed? No  - Was patient intubated pre/post CPR? Yes   Medications Administered: Y = Yes; Blank = No Amiodarone    Atropine    Calcium    Epinephrine  Y  Lidocaine    Magnesium    Norepinephrine    Phenylephrine    Sodium bicarbonate  Y  Vasopressin    Other    Post CPR evaluation:  - Final Status - Was patient successfully resuscitated ? No   Miscellaneous Information:  - Time of death:  7:43 AM  - Primary team notified?  Yes  - Family Notified? Yes     Dorrell, Cathleen Corti, MD   08/30/2018, 7:41 AM

## 2018-09-23 NOTE — IPOC Note (Signed)
IPOC unable to be completed as patient deceased after day of eval for therapies.

## 2018-09-23 NOTE — Progress Notes (Signed)
Patient alert and verbally responsive this shift. I/O this shift and bladder scan as ordered. Fourth bottle of urine collected to monitor for hematuria. Fluids restricted and  monitored as ordered.Patient turn and repositioned throughout shift. No s/s of hypo/hyperglycemia or distress observed. Will continue to monitor/

## 2018-09-23 NOTE — Progress Notes (Signed)
Chaplain escorted family, wife and son-in-law, from main waiting lobby to 60W, offering grief support to family, who was in shock over death.  Oriented family to process, provided grief support, prayer and instructions with card to call patient placement with funeral home information.  Family took pt belongings, inquired about ring in safe. RN and charge RN facilitated retrieving ring from security.   Chaplain appreciates extraordinary kindness and support in facilitating family visit, including around questions about presentation of body before family entered room; chaplain extends thanks especially to [place] and [place], front desk and two other staff RNs.  Theodoro Parma  [phone]     2018/08/27 0932  Clinical Encounter Type  Visited With Family  Visit Type Initial;Death  Referral From Nurse  Consult/Referral To Chaplain  Spiritual Encounters  Spiritual Needs Prayer  Stress Factors  Family Stress Factors Major life changes

## 2018-09-23 NOTE — Progress Notes (Signed)
Patient alert and oriented and verbal responsive; up in bed with HOB elevated; denies pain, SOB; and ask and waiting for breakfast; I/O cath as ordered.

## 2018-09-23 NOTE — IPOC Note (Signed)
No ipoc will be completed

## 2018-09-23 NOTE — Procedures (Signed)
Intubation Procedure Note CARLEY STRICKLING [id number] 1933-09-13  Procedure: Intubation Indications: Respiratory insufficiency  Procedure Details Consent: Unable to obtain consent because of emergent medical necessity. Time Out: Verified patient identification, verified procedure, site/side was marked, verified correct patient position, special equipment/implants available, medications/allergies/relevent history reviewed, required imaging and test results available.  Performed  Maximum sterile technique was used including gloves.  MAC    Evaluation Hemodynamic Status: Transient hypotension treated with fluid; O2 sats: unreadable Patient's Current Condition: unstable Complications: Complications of cpr in progress Patient did tolerate procedure well. Chest X-ray ordered to verify placement.  CXR: pending   Pierre Bali  2018/09/14   Code Blue called to 4w02.  RT bagged pt with 100% FIO2 and setup for intubation.  7.5 ETT placed with + CO2 detection.  BBS heard during bagging.   CPR still in progress.  RT will continue to monitor.

## 2018-09-23 NOTE — Progress Notes (Signed)
   09-24-18 0805  Clinical Encounter Type  Visited With Patient;Health care provider  Visit Type Initial;Code;Death  Referral From Nurse  Consult/Referral To Chaplain  This chaplain responded to Pt. Code Blue.  The chaplain was pastorally present with the Pt. and health care team.  The Pt. died at 27. The chaplain was present when AC-Ron communicated with the family.  The [facility] coordinated plans for the Pt. wife and family to be present with the Pt.  This chaplain understands the chaplain will meet the Pt. family at the [facility] entrance after screening for an escort to 4W.  RN-Latonya or RN-Victoria are the chaplain contact team members. The chaplain is available for F/U spiritual care as needed.

## 2018-09-23 NOTE — Progress Notes (Addendum)
[name] PHYSICAL MEDICINE & REHABILITATION PROGRESS NOTE  Subjective/Complaints: Patient was initially seen this morning sitting up in bed with nurse tech present.  He he stated he did not sleep well overnight.  He noted he had a tiring first day of therapies yesterday.  Nurse tech reported diarrhea overnight.  She reported bladder scans with relatively low residuals.  Discussed with nursing that urine had been collected.  Approximately 45 minutes later, overhead page for CODE BLUE.  Upon arrival pulseless, chest compressions have been initiated, ECG strip reviewed showing PEA.  Continued chest compressions.  Code team arrived, discussed with physician.  CPR was continued for approximately 20 minutes.  Patient was given epinephrine as well as bicarbonate.  Unsuccessful resuscitation.  Patient pronounced deceased at 7:43 AM.  Discussed with medical examiner, death certificate filled out.  ROS: Initially stated: + Diarrhea.  Denied CP, shortness of breath, nausea, vomiting, diarrhea.  Objective: Vital Signs: Blood pressure 103/63, pulse 93, temperature 98.4 F (36.9 C), temperature source Oral, resp. rate 18, height 5\' 8"  (1.727 m), weight 61.6 kg, SpO2 95 %. No results found. Recent Labs    09/01/2018 0148  WBC 13.9*  HGB 8.0*  HCT 23.6*  PLT 158   Recent Labs    09/20/2018 0148  NA 133*  K 3.9  CL 103  CO2 17*  GLUCOSE 95  BUN 81*  CREATININE 4.75*  CALCIUM 8.6*    Physical Exam: BP 103/63   Pulse 93   Temp 98.4 F (36.9 C) (Oral)   Resp 18   Ht 5\' 8"  (1.727 m)   Wt 61.6 kg   SpO2 95%   BMI 20.65 kg/m   Upon initial arrival: Constitutional: No distress . Vital signs reviewed. HENT: Normocephalic.  Atraumatic. Eyes: EOMI. No discharge. Cardiovascular: No JVD. Respiratory: Normal effort. GI: Non-distended. Musc: Right hip with tenderness and edema Neurological: Alert Follows commands.  Motor: Bilateral UE 4+/5 proximal to distal.  RLE: 2-/5 HF, KE limited by  pain, ADF/PF 4/5, unchanged.  LLE 2+-3-/5 hip flexion, knee extension 4/5 ankle dorsiflexion, unchanged Skin: Hip incision with dressing C/D/I Psychiatric: Flat  Assessment/Plan: 1. Functional deficits secondary to right hip fracture status post IM nail which require 3+ hours per day of interdisciplinary therapy in a comprehensive inpatient rehab setting.  Physiatrist is providing close team supervision and 24 hour management of active medical problems listed below.  Physiatrist and rehab team continue to assess barriers to discharge/monitor patient progress toward functional and medical goals  Care Tool:  Bathing  Bathing activity did not occur: Refused           Bathing assist       Upper Body Dressing/Undressing Upper body dressing Upper body dressing/undressing activity did not occur (including orthotics): Safety/medical concerns What is the patient wearing?: Hospital gown only    Upper body assist Assist Level: Maximal Assistance - Patient 25 - 49%    Lower Body Dressing/Undressing Lower body dressing      What is the patient wearing?: Incontinence brief     Lower body assist Assist for lower body dressing: Total Assistance - Patient < 25%     Toileting Toileting Toileting Activity did not occur (Clothing management and hygiene only): N/A (no void or bm)  Toileting assist Assist for toileting: Total Assistance - Patient < 25%(pt is incontinent for BM and in/out cath for bladder)     Transfers Chair/bed transfer  Transfers assist  Chair/bed transfer activity did not occur: Safety/medical concerns  Chair/bed  transfer assist level: Moderate Assistance - Patient 50 - 74%     Locomotion Ambulation   Ambulation assist      Assist level: Minimal Assistance - Patient > 75% Assistive device: Walker-rolling Max distance: 24ft   Walk 10 feet activity   Assist  Walk 10 feet activity did not occur: Safety/medical concerns        Walk 50 feet  activity   Assist Walk 50 feet with 2 turns activity did not occur: Safety/medical concerns         Walk 150 feet activity   Assist Walk 150 feet activity did not occur: Safety/medical concerns         Walk 10 feet on uneven surface  activity   Assist Walk 10 feet on uneven surfaces activity did not occur: Safety/medical concerns         Wheelchair     Assist Will patient use wheelchair at discharge?: (TBD)   Wheelchair activity did not occur: Safety/medical concerns         Wheelchair 50 feet with 2 turns activity    Assist    Wheelchair 50 feet with 2 turns activity did not occur: Safety/medical concerns       Wheelchair 150 feet activity     Assist Wheelchair 150 feet activity did not occur: Safety/medical concerns          Prior assessment/plan:  Medical Problem List and Plan: 1.Decreased functional mobilitysecondary to right hip intertrochanteric hip fracture. Status post IM nailing 08/21/2018. Weightbearing as tolerated  Was to continue with CIR. 2. Antithrombotics: -DVT/anticoagulation:Eliquis -antiplatelet therapy: N/A 3. Pain Management:Hydrocodone and Robaxin as needed -limit narcotics as possible due to neurosedation 4. Mood:Provide emotional support -antipsychotic agents: N/A 5. Neuropsych: This patientis?  Fully capable of making decisions on hisown behalf. 6. Skin/Wound Care:Routine skin checks 7. Fluids/Electrolytes/Nutrition:Routine in and outs 8.Acute blood loss anemia.   Hemoglobin 8.0 on 5/1, unable to order labs for today  Labs ordered for tomorrow 9.Acute on chronic CKD stage III. Baseline creatinine 1.63.   Creatinine 4.75 on 5/1  Labs ordered for tomorrow 10.Chronic diastolic congestive heart failure. Monitor for any signs of fluid overload. Lasix 40 mg twice daily currently on hold due to elevated creatinine Filed Weights   08/30/2018 1747 09/05/2018  0522 09-03-18 0215  Weight: 65 kg 63 kg 61.6 kg   Improving on 5/3 11.Chronic atrial fibrillation. Lopressor 25 mg p.o. twice daily, amiodarone 200 mg daily. Cardiac rate controlled. Follow cardiology services 12. CAD with CABG. No chest pain or shortness of breath. Continue Imdur  Unable to provide limited (given significant CHF) IV fluids  13. History of AAA with repair. Follow-up CTA 6 months 14. Diabetes mellitus. Latest hemoglobin A1c 6.3. Glucophage held due to elevated creatinine. Patient on Amaryl 1 mg daily, Glucophage 500 mg twice daily prior to admission  Appears to be stable  Monitor with increased mobility 15. Hypothyroidism. Synthroid 16.Constipation. Laxative assistance 17.  Hematuria  Initially suspected to be related to traumatic Foley removal.  Urine bottles collected, urine color has been improving/normal until overnight when became dark.  Also thought to be potentially UTI related given hematuria and leukocytosis, UA was equivocally positive, urine culture resulted today negative.  Impaired kidney function precluded Macrobid, so Keflex started on 5/2- documented allergic reaction, unknown drug reaction, later patient stated that he gets itchy with penicillin.  Discussed with nursing as well yesterday.  Patient was agreeable to try Keflex-no reaction noted afterward.  Repeat labs were not able  to be drawn 18.  Diarrhea  Unable to address  LOS: 2 days A FACE TO FACE EVALUATION WAS PERFORMED  Elijah Saunders Karis Juba 09/17/2018, 8:54 AM

## 2018-09-23 NOTE — Progress Notes (Signed)
Inpatient Rehabilitation  Patient information reviewed and entered into eRehab system by [facility]. Karen Kays., CCC/SLP, PPS Coordinator.  Information including medical coding, functional ability and quality indicators will be reviewed and updated.

## 2018-09-23 NOTE — Discharge Summary (Signed)
Physician Discharge Summary  Patient ID: Elijah Saunders MRN: [id number] DOB/AGE: 05/01/1933 85 y.o.  Admit date: 09/20/18 Discharge date: 08/26/2018  Discharge Diagnoses:  Active Problems:   Hip fracture (HCC)   Acute lower UTI   Diabetes mellitus type 2 in nonobese (HCC)   Chronic diastolic congestive heart failure (HCC)   AKI (acute kidney injury) (HCC)   CKD (chronic kidney disease), stage III (HCC)   Acute blood loss anemia   Discharged Condition: Death  Significant Diagnostic Studies: Dg Chest 1 View  Result Date: 08/19/2018 CLINICAL DATA:  85 year old male with a history of hip fracture EXAM: CHEST  1 VIEW COMPARISON:  01/14/2018, 09/04/2015 FINDINGS: Cardiomediastinal silhouette unchanged. Surgical changes of median sternotomy and CABG. Low lung volumes with reticular opacities throughout. No pneumothorax or pleural effusion. Mild fullness in the central vasculature with interlobular septal thickening. No confluent airspace disease. IMPRESSION: Low lung volumes with evidence of edema superimposed on chronic changes. Surgical changes of median sternotomy and CABG Electronically Signed   By: Gilmer Mor D.O.   On: 08/19/2018 21:04   Dg Pelvis 1-2 Views  Result Date: 08/19/2018 CLINICAL DATA:  Fall at home today. Pelvic right hip pain. Initial encounter. EXAM: PELVIS - 1-2 VIEW COMPARISON:  Right hip radiographs also obtained today FINDINGS: There is no evidence of pelvic fracture or diastasis. No pelvic bone lesions are seen. An intertrochanteric right hip fracture is seen. IMPRESSION: No evidence of pelvic fracture. Right hip fracture noted; see separate right hip x-ray report. Electronically Signed   By: Myles Rosenthal M.D.   On: 08/19/2018 20:54   Ct Head Wo Contrast  Result Date: 08/19/2018 CLINICAL DATA:  85 year old male status post fall on concrete patio. Vomiting. EXAM: CT HEAD WITHOUT CONTRAST CT CERVICAL SPINE WITHOUT CONTRAST TECHNIQUE: Multidetector CT imaging of the head  and cervical spine was performed following the standard protocol without intravenous contrast. Multiplanar CT image reconstructions of the cervical spine were also generated. COMPARISON:  None. FINDINGS: CT HEAD FINDINGS Brain: Cerebral volume is within normal limits for age. No ventriculomegaly. No midline shift, mass effect, or evidence of intracranial mass lesion. No acute intracranial hemorrhage identified. No cortically based acute infarct identified. Patchy and confluent bilateral cerebral white matter hypodensity. Bilateral basal ganglia lacunar infarcts, appearing more age indeterminate on the right (series 3, image 20). Small chronic appearing linear right cerebellar infarct on image 9. Vascular: Calcified atherosclerosis at the skull base. No suspicious intracranial vascular hyperdensity. Skull: Osteopenia.  No acute osseous abnormality identified. Sinuses/Orbits: Visualized paranasal sinuses and mastoids are well pneumatized. Other: Calcified scalp vessel atherosclerosis. No scalp hematoma. Negative orbits. CT CERVICAL SPINE FINDINGS Alignment: Preserved cervical lordosis. Cervicothoracic junction alignment is within normal limits. Bilateral posterior element alignment is within normal limits. Skull base and vertebrae: Osteopenia. Visualized skull base is intact. No atlanto-occipital dissociation. No acute osseous abnormality identified. Soft tissues and spinal canal: No prevertebral fluid or swelling. No visible canal hematoma. Bulky calcified carotid atherosclerosis in the neck. Disc levels: Left C7 cervical rib. Degenerative ligamentous hypertrophy about the odontoid. Disc degeneration with vacuum disc at C5-C6. Mild if any degenerative cervical spinal stenosis. Upper chest: Partially visible apically lung scarring. Grossly intact visible upper thoracic levels. IMPRESSION: 1. No acute traumatic injury identified in the head or cervical spine. 2. Small vessel ischemia of the brain with age indeterminate  bilateral basal ganglia lacunar infarcts. 3. Calcified atherosclerosis. Electronically Signed   By: Odessa Fleming M.D.   On: 08/19/2018 20:14   Ct Cervical  Spine Wo Contrast  Result Date: 08/19/2018 CLINICAL DATA:  85 year old male status post fall on concrete patio. Vomiting. EXAM: CT HEAD WITHOUT CONTRAST CT CERVICAL SPINE WITHOUT CONTRAST TECHNIQUE: Multidetector CT imaging of the head and cervical spine was performed following the standard protocol without intravenous contrast. Multiplanar CT image reconstructions of the cervical spine were also generated. COMPARISON:  None. FINDINGS: CT HEAD FINDINGS Brain: Cerebral volume is within normal limits for age. No ventriculomegaly. No midline shift, mass effect, or evidence of intracranial mass lesion. No acute intracranial hemorrhage identified. No cortically based acute infarct identified. Patchy and confluent bilateral cerebral white matter hypodensity. Bilateral basal ganglia lacunar infarcts, appearing more age indeterminate on the right (series 3, image 20). Small chronic appearing linear right cerebellar infarct on image 9. Vascular: Calcified atherosclerosis at the skull base. No suspicious intracranial vascular hyperdensity. Skull: Osteopenia.  No acute osseous abnormality identified. Sinuses/Orbits: Visualized paranasal sinuses and mastoids are well pneumatized. Other: Calcified scalp vessel atherosclerosis. No scalp hematoma. Negative orbits. CT CERVICAL SPINE FINDINGS Alignment: Preserved cervical lordosis. Cervicothoracic junction alignment is within normal limits. Bilateral posterior element alignment is within normal limits. Skull base and vertebrae: Osteopenia. Visualized skull base is intact. No atlanto-occipital dissociation. No acute osseous abnormality identified. Soft tissues and spinal canal: No prevertebral fluid or swelling. No visible canal hematoma. Bulky calcified carotid atherosclerosis in the neck. Disc levels: Left C7 cervical rib.  Degenerative ligamentous hypertrophy about the odontoid. Disc degeneration with vacuum disc at C5-C6. Mild if any degenerative cervical spinal stenosis. Upper chest: Partially visible apically lung scarring. Grossly intact visible upper thoracic levels. IMPRESSION: 1. No acute traumatic injury identified in the head or cervical spine. 2. Small vessel ischemia of the brain with age indeterminate bilateral basal ganglia lacunar infarcts. 3. Calcified atherosclerosis. Electronically Signed   By: Odessa Fleming M.D.   On: 08/19/2018 20:14   Dg Knee Complete 4 Views Right  Result Date: 08/19/2018 CLINICAL DATA:  Fall at home today. Right knee pain. Initial encounter. EXAM: RIGHT KNEE - COMPLETE 4+ VIEW COMPARISON:  None. FINDINGS: No evidence of fracture, dislocation, or joint effusion. Moderate medial compartment osteoarthritis is seen. 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 IMPRESSION: 1. No acute findings. 2. Moderate medial compartment osteoarthritis. Electronically Signed   By: Myles Rosenthal M.D.   On: 08/19/2018 20:56   Dg C-arm 1-60 Min  Result Date: 08/21/2018 CLINICAL DATA:  ORIF right intertrochanteric fracture EXAM: DG C-ARM 61-120 MIN COMPARISON:  None. FLUOROSCOPY TIME:  1 minutes 45 seconds FINDINGS: Right intertrochanteric fracture transfixed with an intramedullary nail and interlocking femoral neck screw. Anatomic alignment. IMPRESSION: Interval ORIF right intertrochanteric fracture. Electronically Signed   By: Elige Ko   On: 08/21/2018 11:35   Ct Renal Stone Study  Result Date: 08/19/2018 CLINICAL DATA:  Acute renal failure. Weight loss. Anorexia. EXAM: CT ABDOMEN AND PELVIS WITHOUT CONTRAST TECHNIQUE: Multidetector CT imaging of the abdomen and pelvis was performed following the standard protocol without IV contrast. COMPARISON:  None. FINDINGS: Lower chest: No acute findings. Hepatobiliary: No mass  visualized on this unenhanced exam. Probable tiny gallstones noted, however there is no evidence of cholecystitis or biliary duct dilatation. Pancreas: No mass or inflammatory process visualized on this unenhanced exam. Spleen:  Within normal limits in size. Adrenals/Urinary tract: Normal appearance of both adrenal glands. Left kidney is absent. Right renal vascular calcifications noted, without definite calculi. No evidence of ureteral calculi or hydronephrosis. A few tiny calculi  are noted within the urinary bladder. Stomach/Bowel: No evidence of obstruction, inflammatory process, or abnormal fluid collections. Diverticulosis is seen mainly involving the descending and sigmoid colon, however there is no evidence of diverticulitis. Vascular/Lymphatic: No pathologically enlarged lymph nodes identified. A suprarenal abdominal aortic aneurysm is seen measuring 4.7 cm in maximum diameter. Reproductive:  No mass or other significant abnormality. Other:  None. Musculoskeletal:  No suspicious bone lesions identified. IMPRESSION: 1. No acute findings. 2. Probable tiny gallstones. No radiographic evidence of cholecystitis. 3. Colonic diverticulosis, without radiographic evidence of cholecystitis. 4. Tiny calculi within urinary bladder. No evidence of ureteral calculi or hydronephrosis. 5. 4.7 cm suprarenal abdominal aortic aneurysm. Recommend followup by abdomen and pelvis CTA in 6 months, and vascular surgery referral/consultation if not already obtained. This recommendation follows ACR consensus guidelines: White Paper of the ACR Incidental Findings Committee II on Vascular Findings. J Am Coll Radiol 2013; 10:789-794. Electronically Signed   By: Myles RosenthalJohn  Stahl M.D.   On: 08/19/2018 22:53   Dg Hip Operative Unilat W Or W/o Pelvis Right  Result Date: 08/21/2018 CLINICAL DATA:  ORIF right intertrochanteric fracture EXAM: DG C-ARM 61-120 MIN COMPARISON:  None. FLUOROSCOPY TIME:  1 minutes 45 seconds FINDINGS: Right  intertrochanteric fracture transfixed with an intramedullary nail and interlocking femoral neck screw. Anatomic alignment. IMPRESSION: Interval ORIF right intertrochanteric fracture. Electronically Signed   By: Elige KoHetal  Patel   On: 08/21/2018 11:35   Dg Femur Min 2 Views Right  Result Date: 08/19/2018 CLINICAL DATA:  Fall at home today. Right hip and thigh pain. Initial encounter. EXAM: RIGHT FEMUR 2 VIEWS COMPARISON:  None. FINDINGS: A nondisplaced intertrochanteric right hip fracture is seen. No evidence of distal femur fracture. Extensive peripheral vascular calcification noted. IMPRESSION: Nondisplaced intertrochanteric right hip fracture. Electronically Signed   By: Myles RosenthalJohn  Stahl M.D.   On: 08/19/2018 20:55    Labs:  Basic Metabolic Panel: Recent Labs  Lab 08/19/18 1918 08/20/18 0144 08/20/18 1503 08/21/18 0205 08/22/18 0406 2018-07-30 0148  NA 135 136 135 133* 132* 133*  K 3.6 3.4* 3.2* 3.3* 4.1 3.9  CL 100 103 103 101 100 103  CO2 22 16* 18* 19* 19* 17*  GLUCOSE 56* 73 110* 109* 167* 95  BUN 109* 106* 98* 89* 81* 81*  CREATININE 6.18* 6.10* 5.68* 5.34* 4.97* 4.75*  CALCIUM 9.2 9.0 8.8* 8.9 8.9 8.6*    CBC: Recent Labs  Lab 08/19/18 1918 08/22/18 0406 2018-07-30 0148  WBC 11.9* 12.0* 13.9*  NEUTROABS 9.6*  --   --   HGB 10.3* 8.9* 8.0*  HCT 31.0* 26.3* 23.6*  MCV 93.4 92.6 92.2  PLT 210 173 158    CBG: Recent Labs  Lab 08/22/18 2139 2018-07-30 0747 2018-07-30 1031 2018-07-30 1211 2018-07-30 1643  GLUCAP 125* 73 96 83 83   Family history.  Mother with Alzheimer's and diabetes.  Father with CAD and diabetes.  Brief HPI:    Elijah Saunders is an 85 year old right-handed male with history of diastolic congestive heart failure with ejection fraction of 40%, AAA status post repair, CAD with CABG 2003, hypertension, hyperlipidemia, atrial fibrillation maintained on amiodarone as well as Eliquis, CKD stage III with nephrectomy 1955 and type 2 diabetes mellitus.  Per chart review  lives with spouse independent ambulating prior to admission and active.  Son and grandson live next door.  Presented 08/19/2018 after mechanical fall while getting out of a chair.  No loss of consciousness.  Cranial CT scan as well as CT cervical spine negative  for acute findings.  There was noted small vessel ischemia of the brain with age indeterminate bilateral basal ganglia lacunar infarctions.  X-rays and imaging revealed nondisplaced intertrochanteric right hip fracture.  Noted elevated creatinine 6.18 with BUN 109 baseline 1.63.  CT renal stone study negative for acute findings.  No evidence of ureteral calculi or hydronephrosis.  4.7 suprarenal abdominal aortic aneurysm also noted 2017 recommendations of pelvis CTA 6 months.  Cardiology consulted in regards to atrial fibrillation Eliquis initially held.  Patient underwent IM nailing 08/21/2018 per Dr.Xu.  Hospital course pain management weightbearing as tolerated cleared to resume Eliquis 08/22/2018.  Acute blood loss anemia 8.0 creatinine improved to 4.75 monitoring of hydration.  Patient's home Lasix had been held due to elevated creatinine with strict INO's.  Metformin had also been placed on hold due to elevated creatinine.  Therapy evaluations completed.  Patient was admitted for a comprehensive rehab program  Hospital Course: Elijah Saunders was admitted to rehab 09/08/2018 for inpatient therapies to consist of PT, ST and OT at least three hours five days a week. Past admission physiatrist, therapy team and rehab RN have worked together to provide customized collaborative inpatient rehab.  Pertaining to patient right intertrochanteric hip fracture IM nailing 08/21/2018 weightbearing as tolerated.  Neurovascular sensation intact.  Patient's chronic Eliquis has been resumed.  Pain management use of hydrocodone and Robaxin.  Acute blood loss anemia 8.0 with follow-up chemistries pending.  Acute on chronic CKD stage III baseline creatinine 1.63 latest  creatinine 4.75 on 09/01/2018 with plan follow-up chemistries 08/26/2018.  Chronic diastolic congestive heart failure Lasix was currently on hold due to elevated creatinine.  Patient with noted history of chronic atrial fibrillation followed by cardiology services maintained on Lopressor as well as amiodarone.  He did have a history of CAD with CABG he remained on Imdur.  History of AAA with repair follow-up CTA was recommended in 6 months.  Blood sugars monitored hemoglobin A1c 6.3 Glucophage was held due to elevated creatinine.  Noted hematuria initially suspect related to traumatic Foley removal urine bottles collected noted urine color had been improving when overnight became dark.  Also thought to be potential UTI related to hematuria UA was equivocally positive urine culture results negative Keflex initiated 09/19/2018.  On the morning of 09/22/18 patient alert verbally responsive until approximately 7:34 AM noted to be unresponsive pulseless CODE BLUE was called ACLS protocol initiated.  CPR performed for approximately 20 minutes.  Patient was not successfully resuscitated.  Family was contacted.   Rehab course: During patient's stay in rehab weekly team conferences were held to monitor patient's progress, set goals and discuss barriers to discharge. At admission, patient required minimal assist to ambulate 2 feet rolling walker.  +2 physical assist sit to stand.  Minimal assist upper body bathing max assist lower body bathing minimal assist upper body dressing max assist lower body dressing  He  has had improvement in activity tolerance, balance, postural control as well as ability to compensate for deficits. He/She has had improvement in functional use RUE/LUE  and RLE/LLE as well as improvement in awareness.  Patient's evaluation while on rehab unit required max with mobility secondary to his muscle weakness working with energy conservation techniques.  Required max with basic self skill care again full  evaluation suggest begun.       Disposition: Patient deceased   Diet:  Special Instructions:   Allergies as of Sep 22, 2018      Reactions   Ace Inhibitors Other (See  Comments)   Renal insuffiencey   Atorvastatin Other (See Comments)   Intolerant to many statins, muscle pain   Crestor [rosuvastatin Calcium] Other (See Comments)   Muscle pain   Zetia [ezetimibe] Other (See Comments)   Leg cramps   Lopid [gemfibrozil]    Unknown reaction   Penicillins Other (See Comments)   UNSPECIFIED REACTION  Has patient had a PCN reaction causing immediate rash, facial/tongue/throat swelling, SOB or lightheadedness with hypotension: unknown Has patient had a PCN reaction causing severe rash involving mucus membranes or skin necrosis: unknown Has patient had a PCN reaction that required hospitalization: unknown Has patient had a PCN reaction occurring within the last 10 years: no  If all of the above answers are "NO", then may proceed with Cephalosporin use.      Medication List    ASK your doctor about these medications   acetaminophen 325 MG tablet Commonly known as:  TYLENOL Take 650 mg by mouth every 6 (six) hours as needed for headache (pain).   amiodarone 200 MG tablet Commonly known as:  PACERONE Take 1 tablet (200 mg total) by mouth daily.   BLUE-EMU MAXIMUM STRENGTH EX Apply 1 application topically 2 (two) times a day. For knee pain/ sore muscles   Eliquis 2.5 MG Tabs tablet Generic drug:  apixaban TAKE 1 TABLET BY MOUTH TWICE DAILY   glimepiride 1 MG tablet Commonly known as:  AMARYL Take 1 mg by mouth daily with lunch.   isosorbide mononitrate 30 MG 24 hr tablet Commonly known as:  IMDUR TAKE 1 TABLET BY MOUTH DAILY   levothyroxine 100 MCG tablet Commonly known as:  SYNTHROID Take 100 mcg by mouth daily before breakfast.   metoprolol tartrate 25 MG tablet Commonly known as:  LOPRESSOR Take 1 tablet (25 mg total) by mouth 2 (two) times daily.    oxyCODONE-acetaminophen 5-325 MG tablet Commonly known as:  Percocet Take 1-2 tablets by mouth every 6 (six) hours as needed for severe pain.   PreserVision AREDS 2 Caps Take 1 capsule by mouth 2 (two) times daily.   Systane Ultra 0.4-0.3 % Soln Generic drug:  Polyethyl Glycol-Propyl Glycol Place 1 drop into both eyes daily.   vitamin B-12 500 MCG tablet Commonly known as:  CYANOCOBALAMIN Take 500 mcg by mouth at bedtime. Vitamin b12   Vitamin D (Ergocalciferol) 1.25 MG (50000 UT) Caps capsule Commonly known as:  DRISDOL Take 50,000 Units by mouth every Wednesday.        Signed: Mcarthur Rossetti Jacquelene Kopecky 08/26/2018, 5:57 AM

## 2018-09-23 NOTE — Progress Notes (Signed)
Made Dr. Allena Katz about string of urine collection ordered for patient ; urine collected as ordered and waiting as ordered in patient's room.

## 2018-09-23 DEATH — deceased

## 2018-11-07 ENCOUNTER — Encounter (INDEPENDENT_AMBULATORY_CARE_PROVIDER_SITE_OTHER): Payer: Medicare Other | Admitting: Ophthalmology

## 2020-11-10 IMAGING — CT CT RENAL STONE PROTOCOL
2 of 4 series · 16 of 46 positions shown, 18 images · non-contrast
Comparison: None.

CLINICAL DATA: Acute renal failure. Weight loss. Anorexia.

EXAM:
CT ABDOMEN AND PELVIS WITHOUT CONTRAST
TECHNIQUE: Multidetector CT imaging of the abdomen and pelvis was performed
following the standard protocol without IV contrast.

[Series 3: renal stone 5.0 · axial · 0.86mm/px · z∈[-604,-200]mm · 13 of 89 slices shown, 15 images]
[im 4/89  soft-tissue]
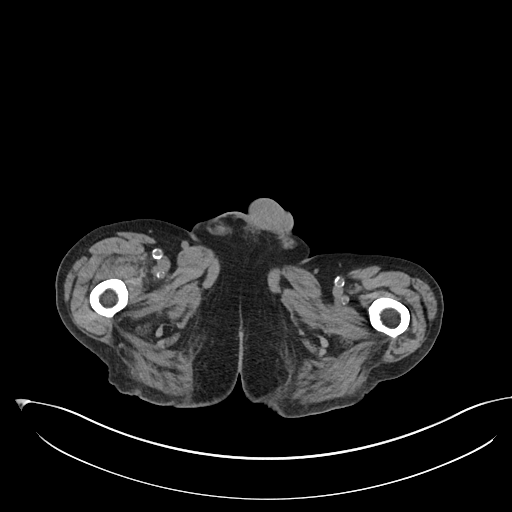
[im 4/89  bone]
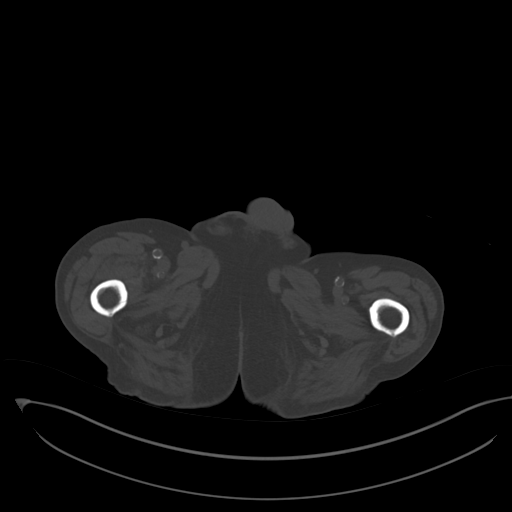
[im 11/89  soft-tissue]
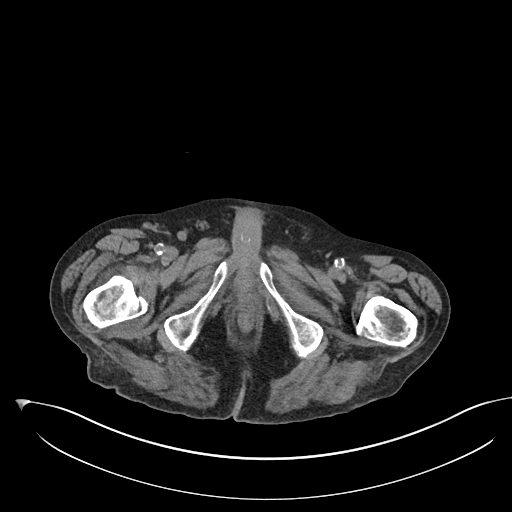
[im 18/89  soft-tissue]
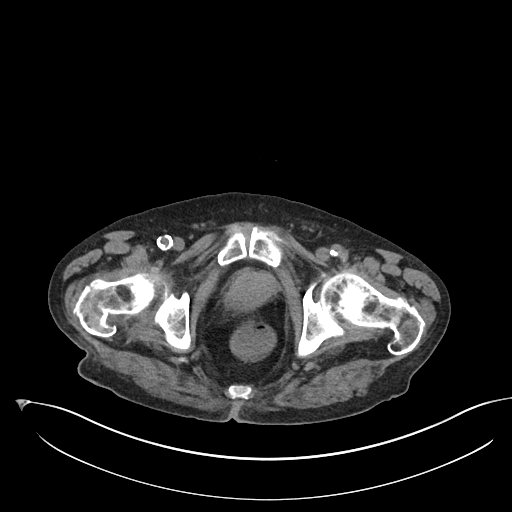
[im 25/89  soft-tissue]
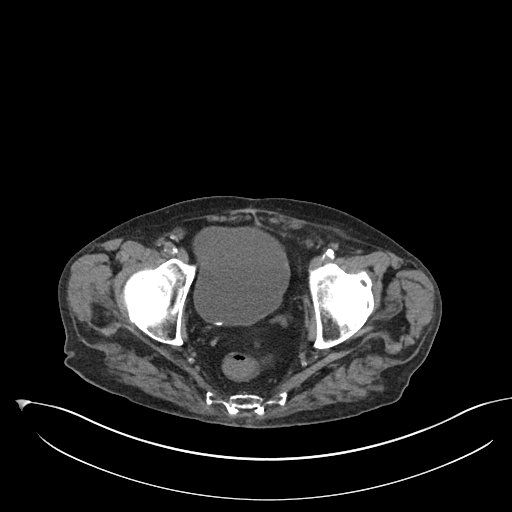
[im 32/89  soft-tissue]
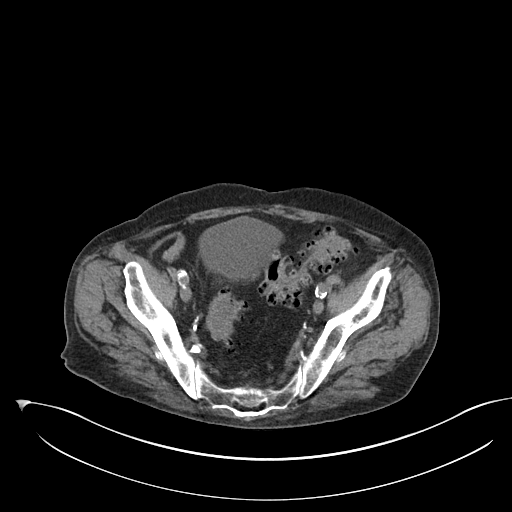
[im 39/89  soft-tissue]
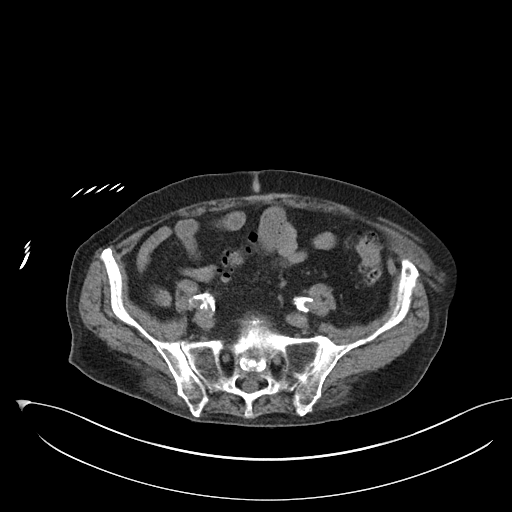
[im 46/89  soft-tissue]
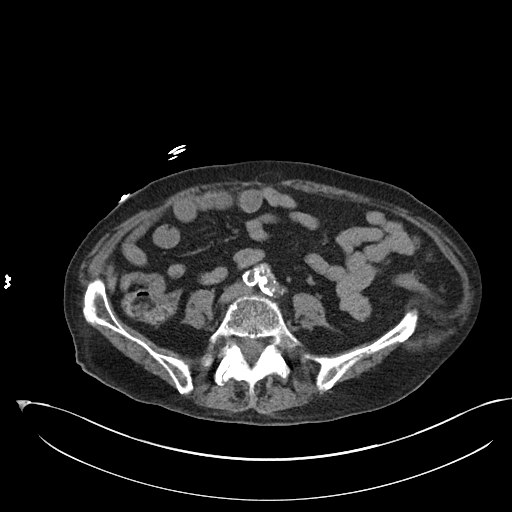
[im 50/89  soft-tissue]
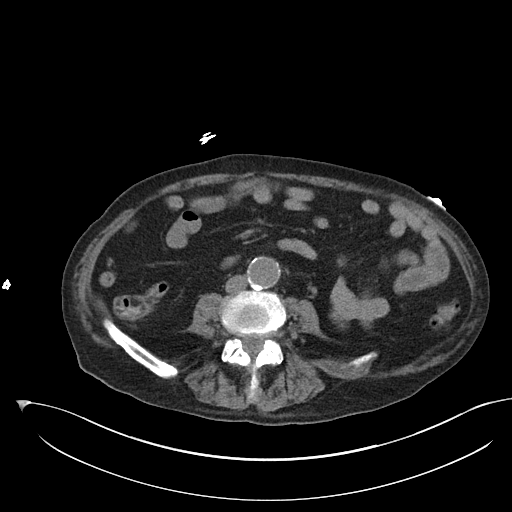
[im 57/89  soft-tissue]
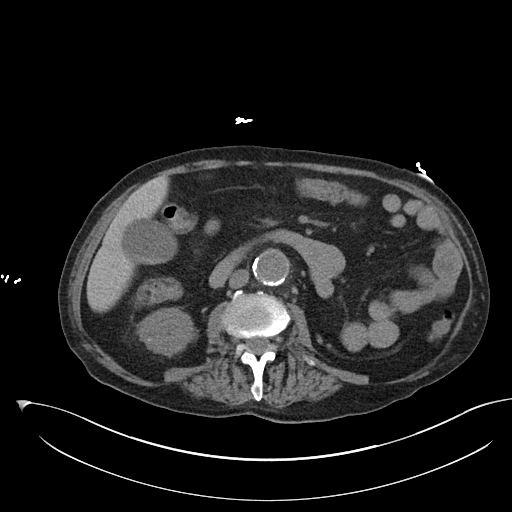
[im 57/89  bone]
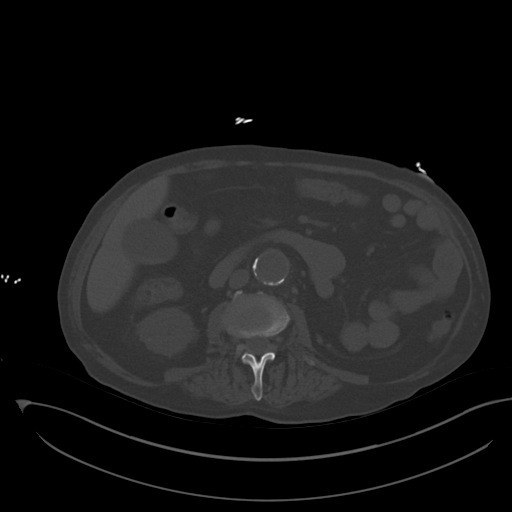
[im 64/89  soft-tissue]
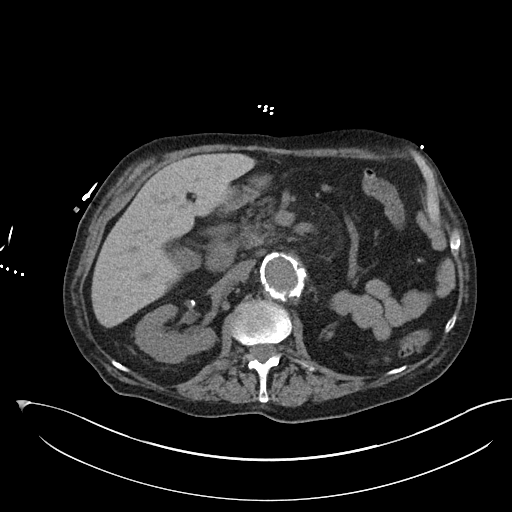
[im 71/89  soft-tissue]
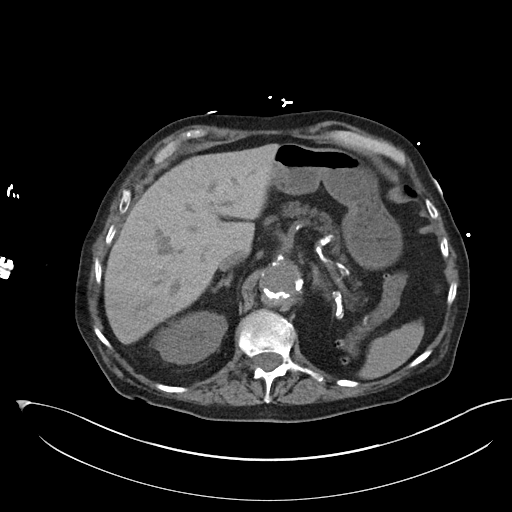
[im 78/89  soft-tissue]
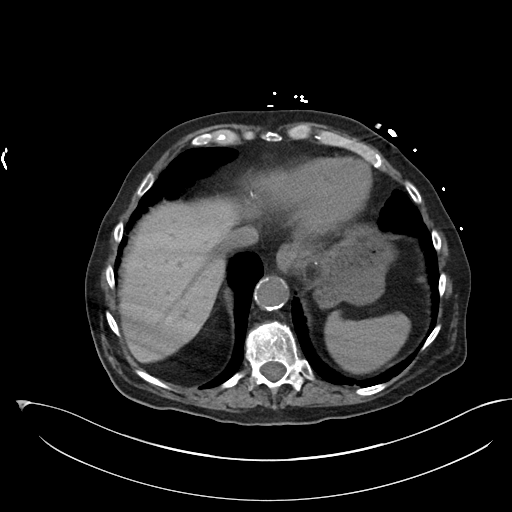
[im 85/89  soft-tissue]
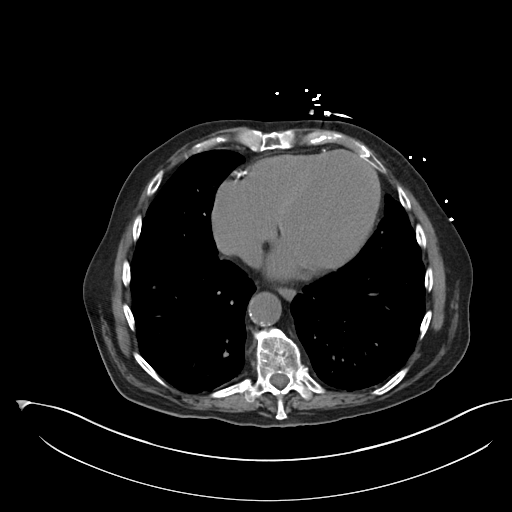

[Series 5: renal stone 3.0 cor · coronal · 0.85mm/px · 3 of 93 slices shown]
[im 31/93  soft-tissue]
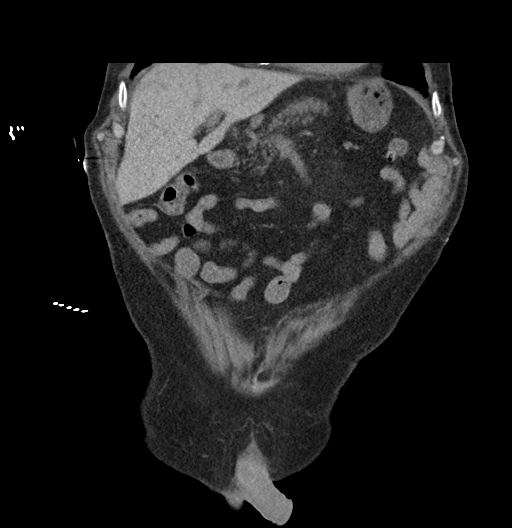
[im 41/93  soft-tissue]
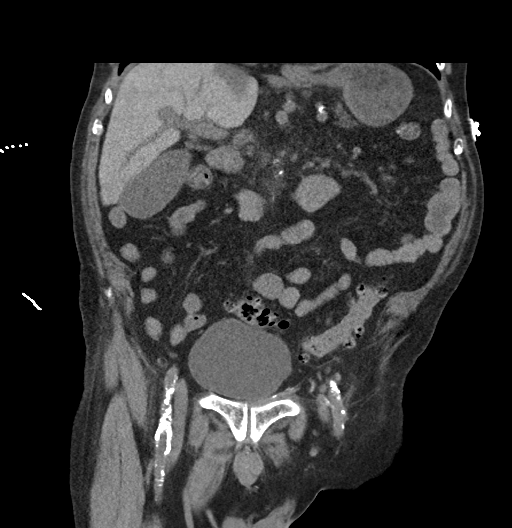
[im 52/93  soft-tissue]
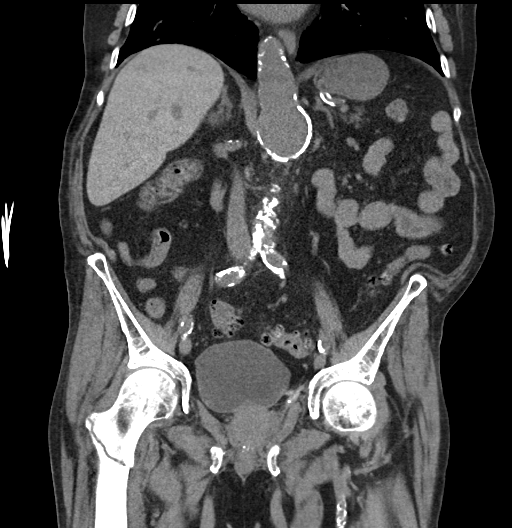

[16 of 46 positions shown; findings below may reference images not displayed]

FINDINGS: Lower chest: No acute findings.

Hepatobiliary: No mass visualized on this unenhanced exam. Probable
tiny gallstones noted, however there is no evidence of cholecystitis
or biliary duct dilatation.

Pancreas: No mass or inflammatory process visualized on this
unenhanced exam.

Spleen:  Within normal limits in size.

Adrenals/Urinary tract: Normal appearance of both adrenal glands.
Left kidney is absent. Right renal vascular calcifications noted,
without definite calculi. No evidence of ureteral calculi or
hydronephrosis. A few tiny calculi are noted within the urinary
bladder.

Stomach/Bowel: No evidence of obstruction, inflammatory process, or
abnormal fluid collections. Diverticulosis is seen mainly involving
the descending and sigmoid colon, however there is no evidence of
diverticulitis.

Vascular/Lymphatic: No pathologically enlarged lymph nodes
identified. A suprarenal abdominal aortic aneurysm is seen measuring
4.7 cm in maximum diameter.

Reproductive:  No mass or other significant abnormality.

Other:  None.

Musculoskeletal:  No suspicious bone lesions identified.
IMPRESSION: 1. No acute findings.
2. Probable tiny gallstones. No radiographic evidence of
cholecystitis.
3. Colonic diverticulosis, without radiographic evidence of
cholecystitis.
4. Tiny calculi within urinary bladder. No evidence of ureteral
calculi or hydronephrosis.
5. 4.7 cm suprarenal abdominal aortic aneurysm. Recommend followup
by abdomen and pelvis CTA in 6 months, and vascular surgery
referral/consultation if not already obtained. This recommendation
follows ACR consensus guidelines: White Paper of the ACR Incidental

## 2020-11-10 IMAGING — CR RIGHT KNEE - COMPLETE 4+ VIEW
4 series · 4 of 4 positions shown · non-contrast
Comparison: None.

CLINICAL DATA: Fall at home today. Right knee pain. Initial
encounter.

EXAM:
RIGHT KNEE - COMPLETE 4+ VIEW

[knee ap]
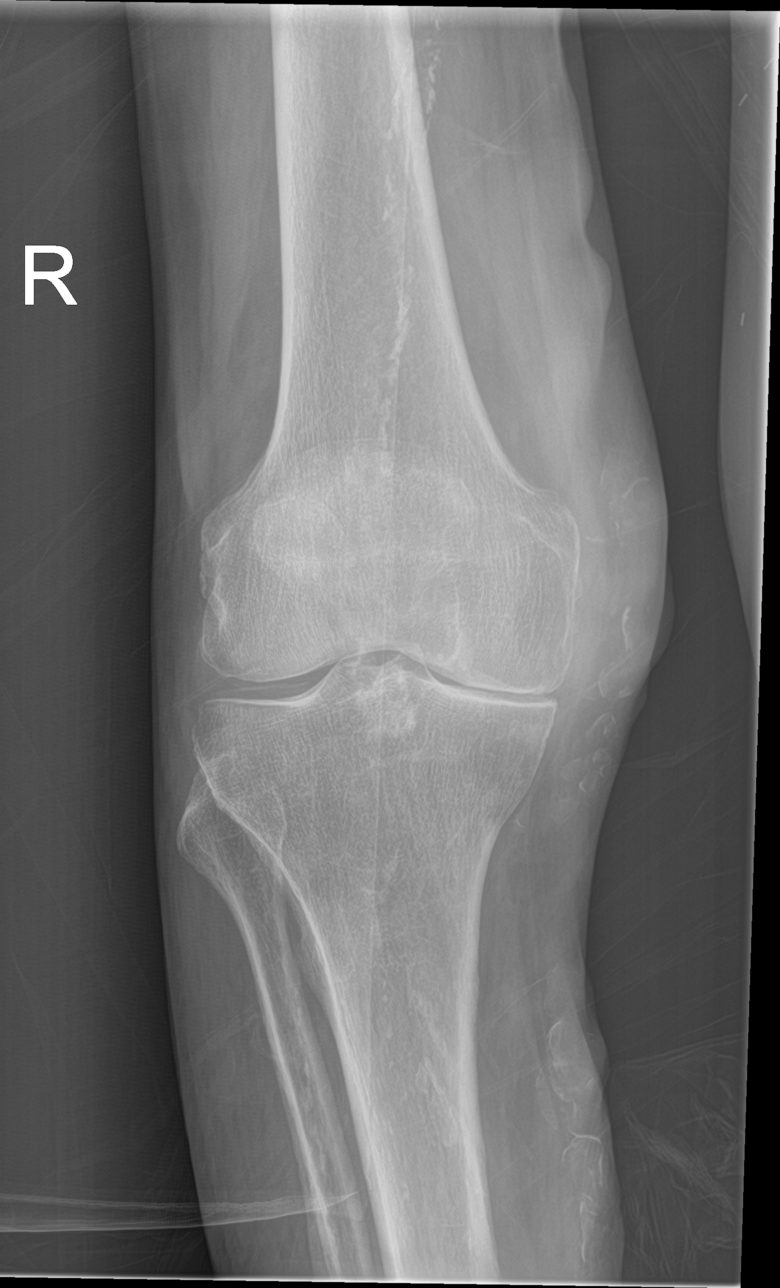

[knee lat]
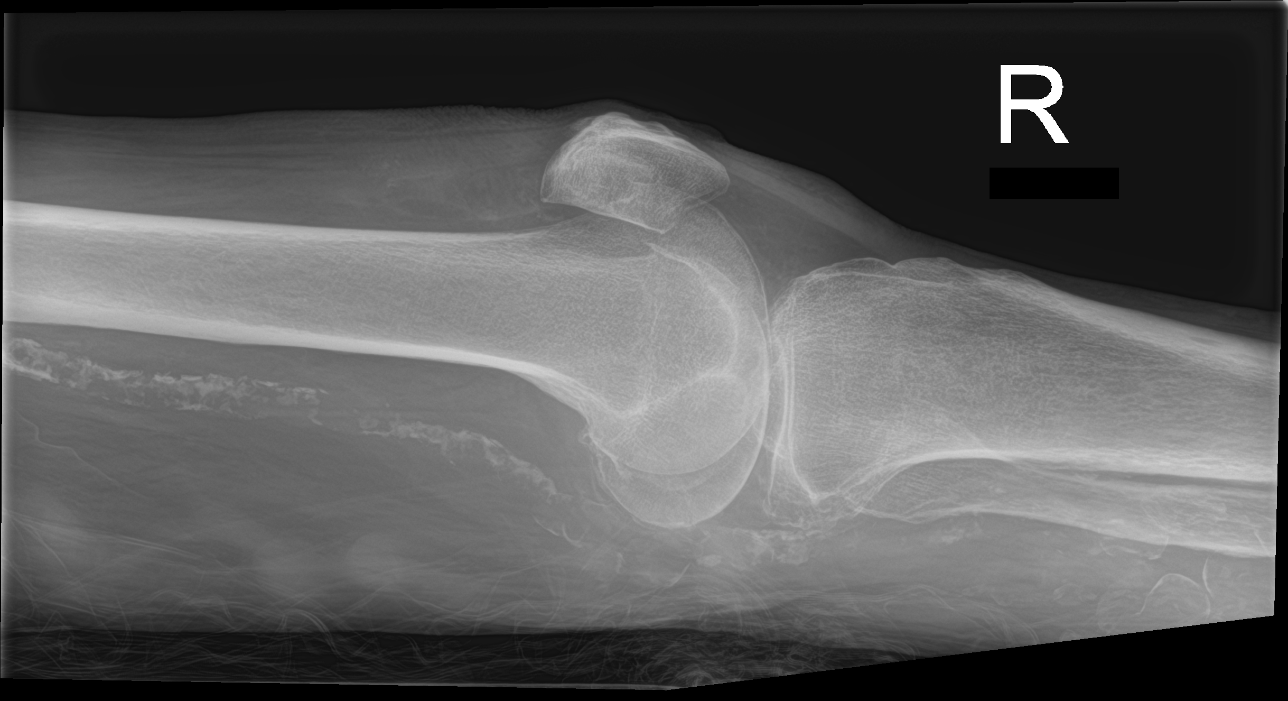

[knee obl (1 of 2)]
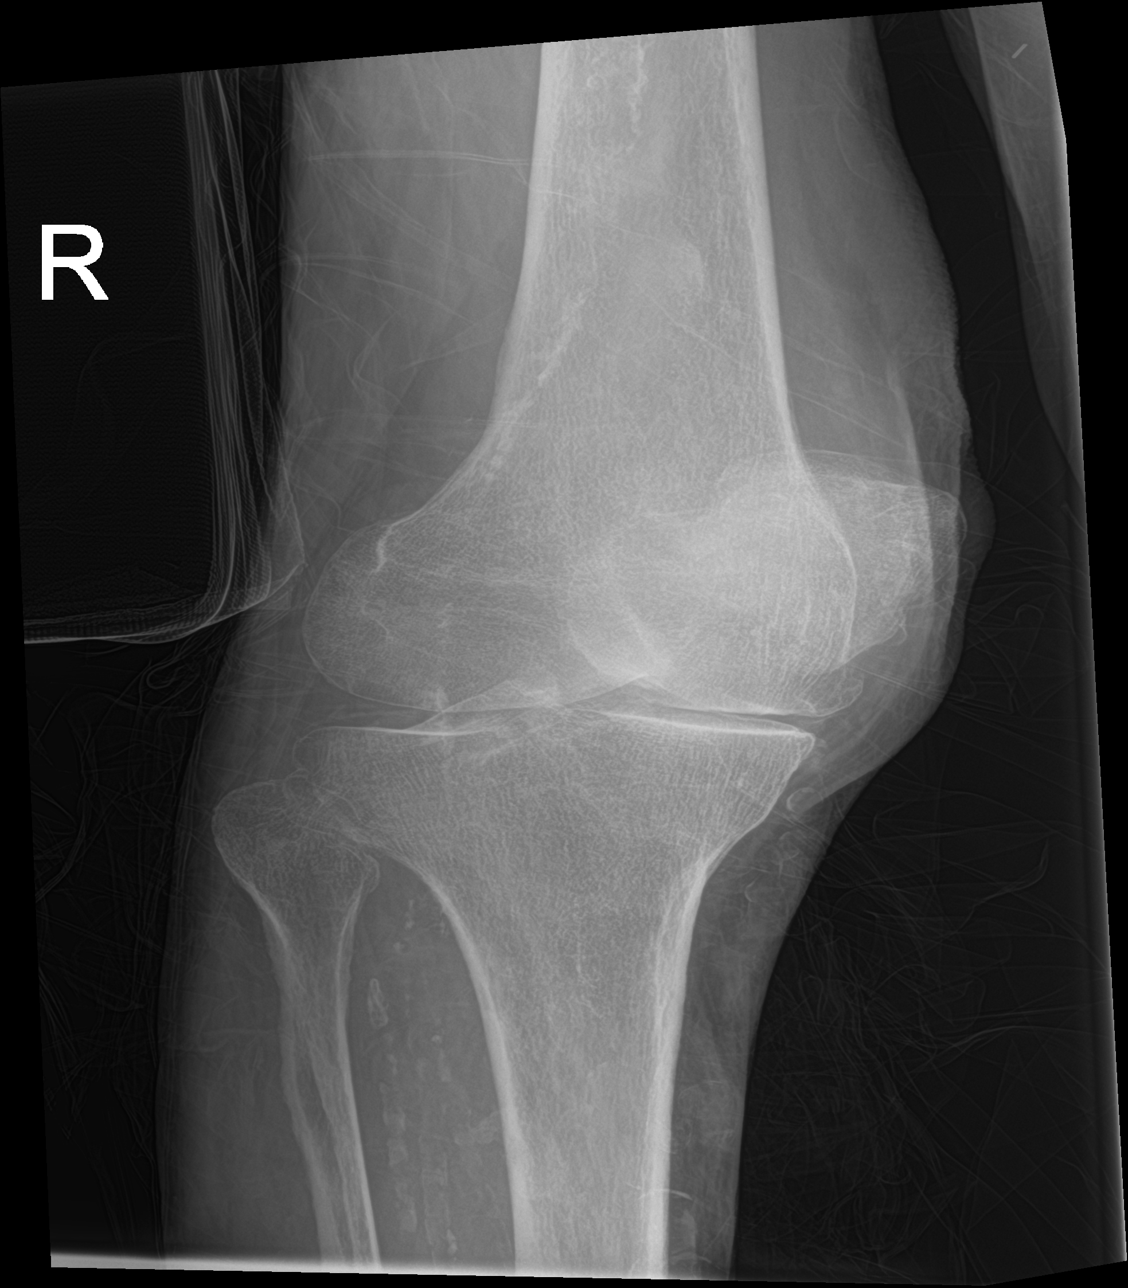

[knee obl (2 of 2)]
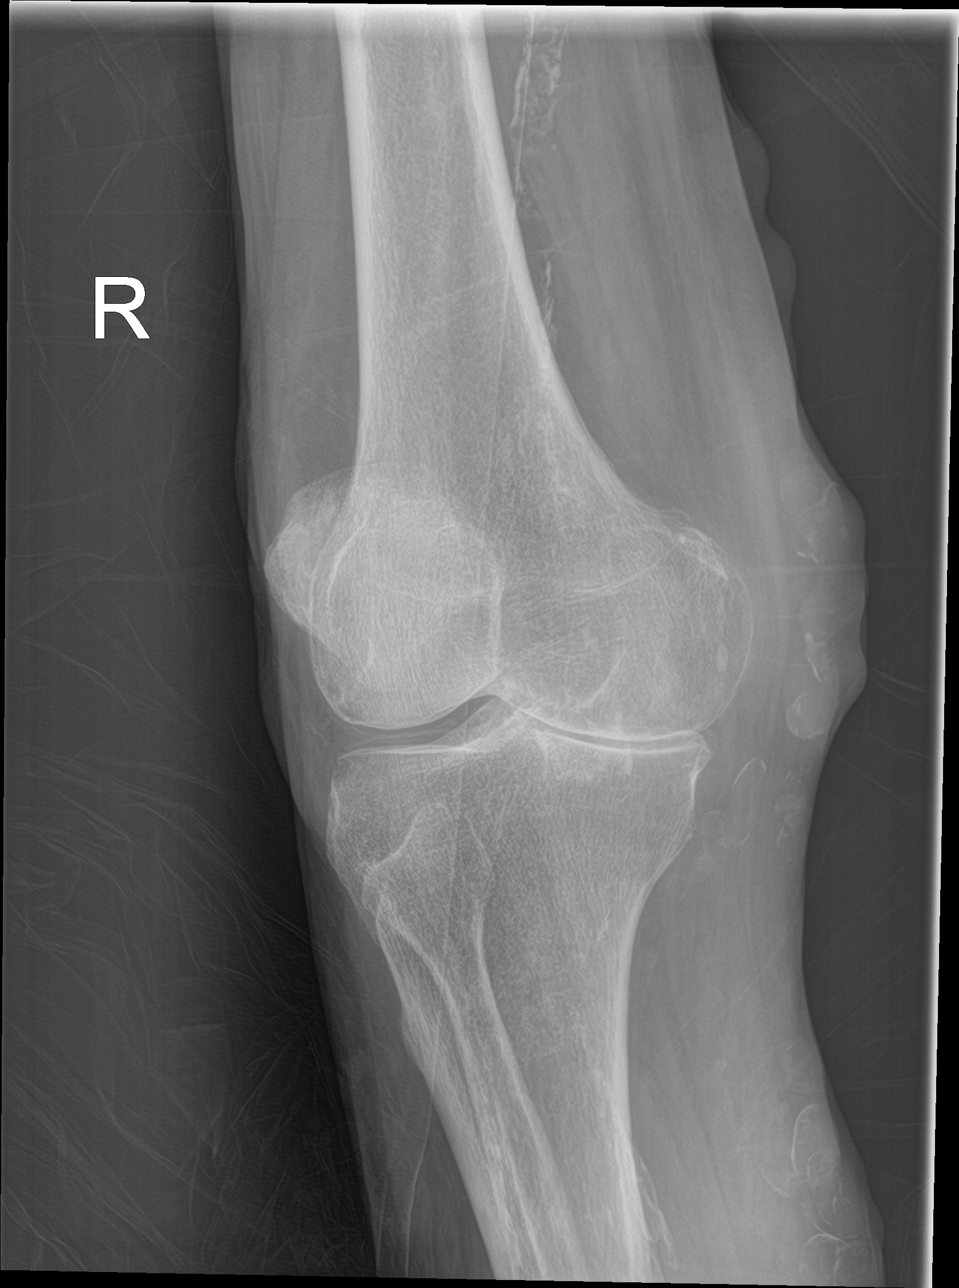

[4 of 4 positions shown; findings below may reference images not displayed]

FINDINGS: No evidence of fracture, dislocation, or joint effusion. Moderate
medial compartment osteoarthritis is seen. Extensive peripheral
vascular calcification noted, as well as calcified varices
consistent with chronic superficial thrombophlebitis.
IMPRESSION: 1. No acute findings.
2. Moderate medial compartment osteoarthritis.

## 2020-11-10 IMAGING — CT CT CERVICAL SPINE WITHOUT CONTRAST
5 of 8 series · 11 of 33 positions shown, 12 images · non-contrast
Comparison: None.

CLINICAL DATA: 84-year-old male status post fall on concrete patio.
Vomiting.

EXAM:
CT HEAD WITHOUT CONTRAST
CT CERVICAL SPINE WITHOUT CONTRAST
TECHNIQUE: Multidetector CT imaging of the head and cervical spine was
performed following the standard protocol without intravenous
contrast. Multiplanar CT image reconstructions of the cervical spine
were also generated.

[Series 4: head bone · axial · 0.39mm/px · z∈[+1317,+1373]mm · 2 of 86 slices shown]
[im 29/86  bone]
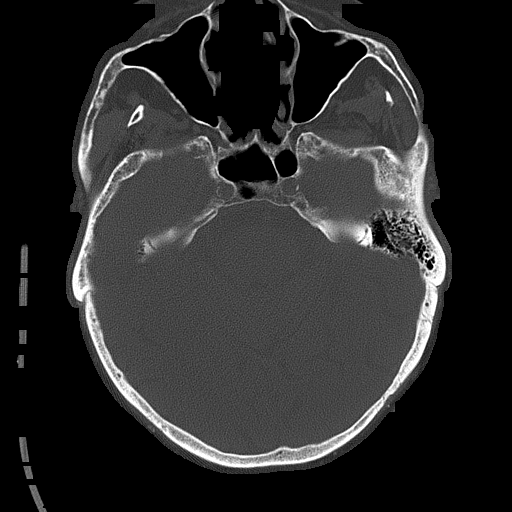
[im 57/86  bone]
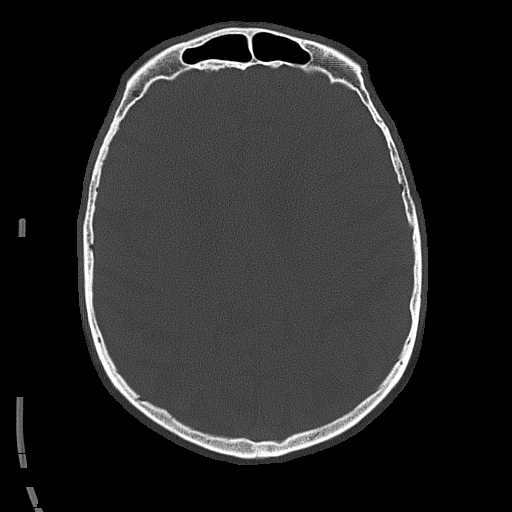

[Series 8: c_spine 2.0 st · axial · 0.29mm/px · z∈[+1216,+1264]mm · 2 of 72 slices shown, 3 images]
[im 24/72  soft-tissue]
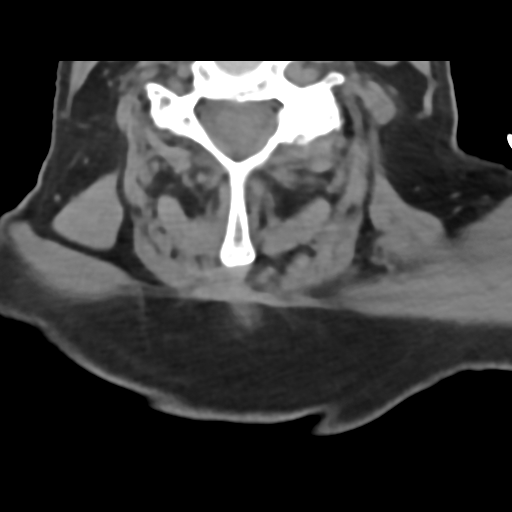
[im 24/72  bone]
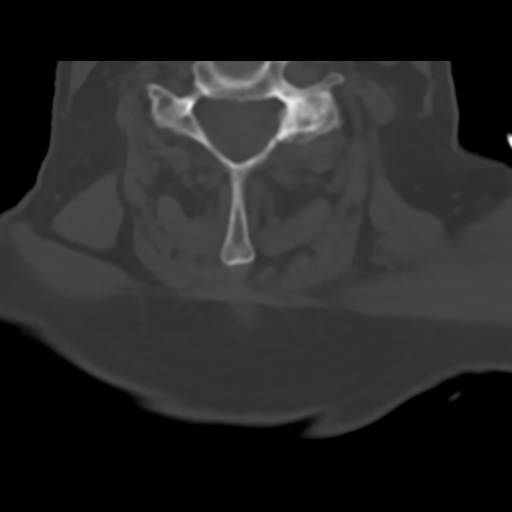
[im 48/72  bone]
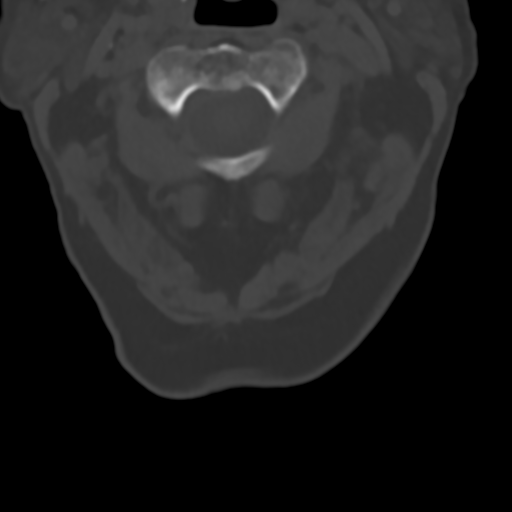

[Series 10: c_spine 2.0 sag bone · sagittal · 0.23mm/px · 4 of 51 slices shown]
[im 11/51  bone]
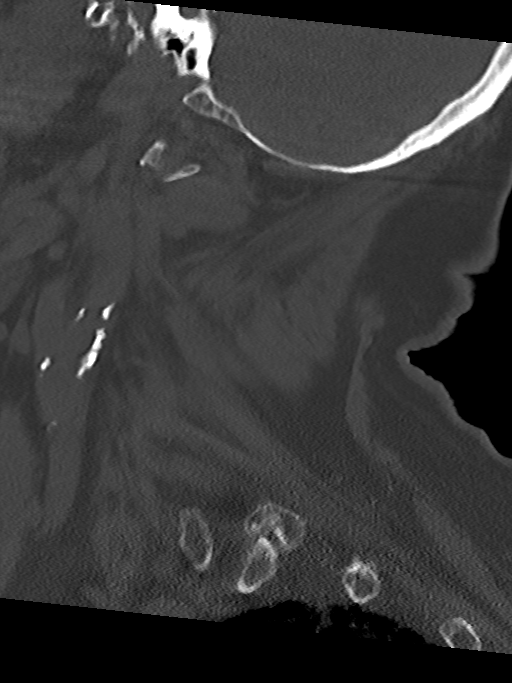
[im 21/51  bone]
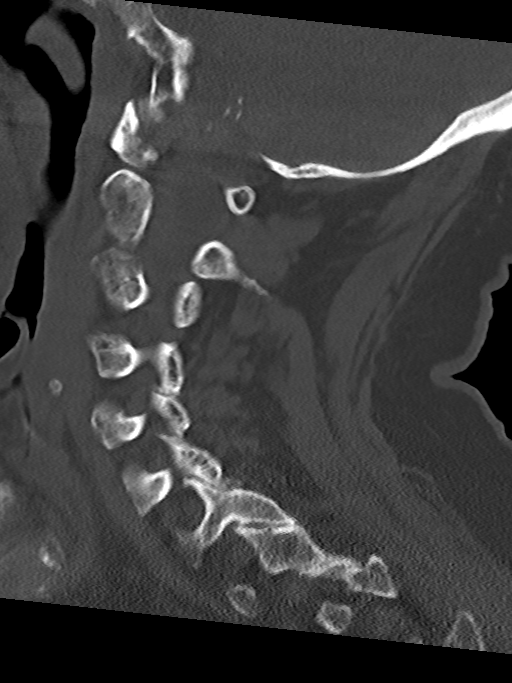
[im 31/51  bone]
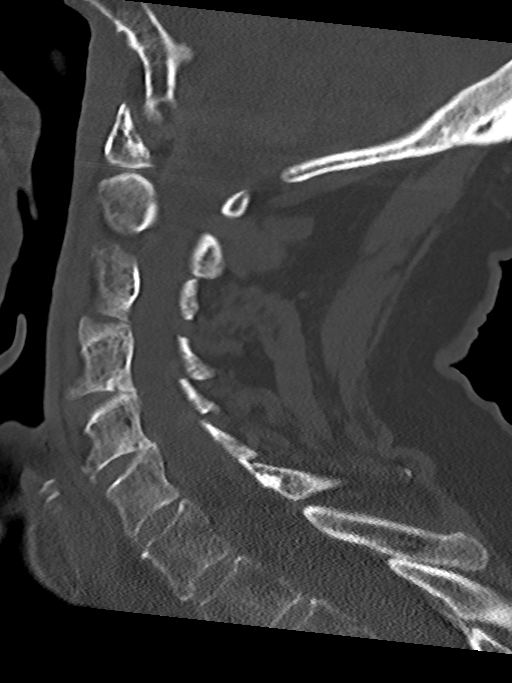
[im 41/51  bone]
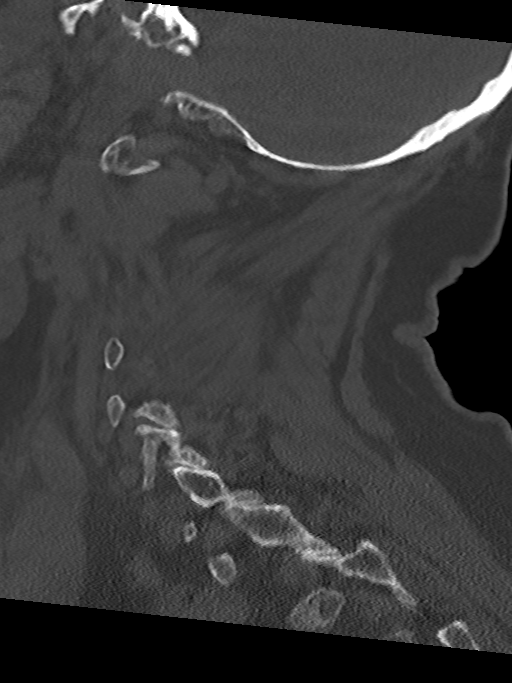

[Series 11: c_spine 2.0 cor bone · coronal · 0.20mm/px · 1 of 61 slices shown]
[im 31/61  bone]
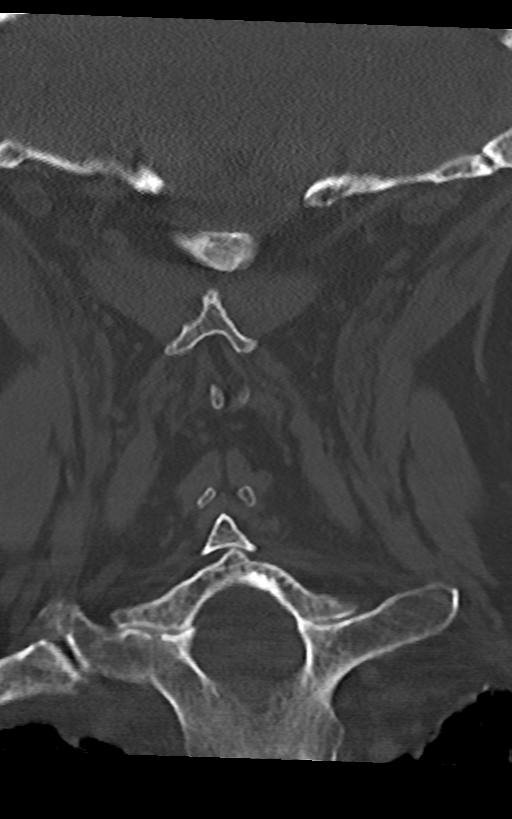

[Series 12: c_spine 2.0 orthogonals · axial · 0.21mm/px · z∈[+1190,+1247]mm · 2 of 75 slices shown]
[im 25/75  bone]
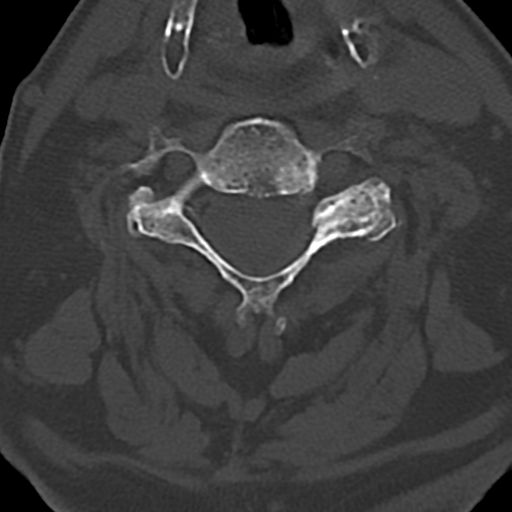
[im 50/75  bone]
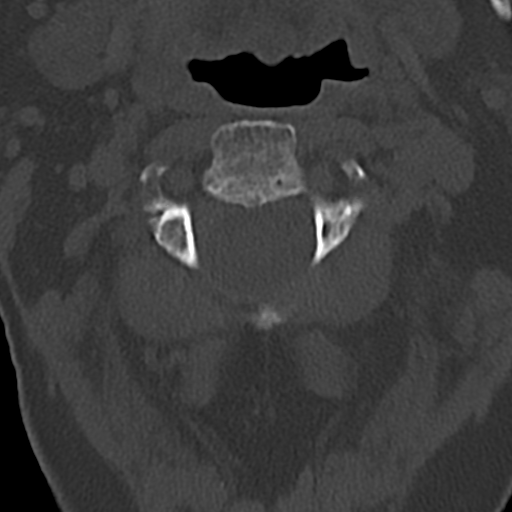

[11 of 33 positions shown; findings below may reference images not displayed]

FINDINGS: CT HEAD FINDINGS

Brain: Cerebral volume is within normal limits for age. No
ventriculomegaly. No midline shift, mass effect, or evidence of
intracranial mass lesion. No acute intracranial hemorrhage
identified. No cortically based acute infarct identified.

Patchy and confluent bilateral cerebral white matter hypodensity.
Bilateral basal ganglia lacunar infarcts, appearing more age
indeterminate on the right (series 3, image 20). Small chronic
appearing linear right cerebellar infarct on image 9.

Vascular: Calcified atherosclerosis at the skull base. No suspicious
intracranial vascular hyperdensity.

Skull: Osteopenia.  No acute osseous abnormality identified.

Sinuses/Orbits: Visualized paranasal sinuses and mastoids are well
pneumatized.

Other: Calcified scalp vessel atherosclerosis. No scalp hematoma.
Negative orbits.

CT CERVICAL SPINE FINDINGS

Alignment: Preserved cervical lordosis. Cervicothoracic junction
alignment is within normal limits. Bilateral posterior element
alignment is within normal limits.

Skull base and vertebrae: Osteopenia. Visualized skull base is
intact. No atlanto-occipital dissociation. No acute osseous
abnormality identified.

Soft tissues and spinal canal: No prevertebral fluid or swelling. No
visible canal hematoma. Bulky calcified carotid atherosclerosis in
the neck.

Disc levels: Left C7 cervical rib. Degenerative ligamentous
hypertrophy about the odontoid. Disc degeneration with vacuum disc
at C5-C6. Mild if any degenerative cervical spinal stenosis.

Upper chest: Partially visible apically lung scarring. Grossly
intact visible upper thoracic levels.
IMPRESSION: 1. No acute traumatic injury identified in the head or cervical
spine.
2. Small vessel ischemia of the brain with age indeterminate
bilateral basal ganglia lacunar infarcts.
3. Calcified atherosclerosis.

## 2020-11-12 IMAGING — RF DG C-ARM 61-120 MIN
1 series · 4 of 4 positions shown · non-contrast
Comparison: None.

FLUOROSCOPY TIME:  1 minutes 45 seconds

CLINICAL DATA: ORIF right intertrochanteric fracture

EXAM:
DG C-ARM 61-120 MIN

[Series 1: run · 4 of 4 slices shown]
[im 1/4]
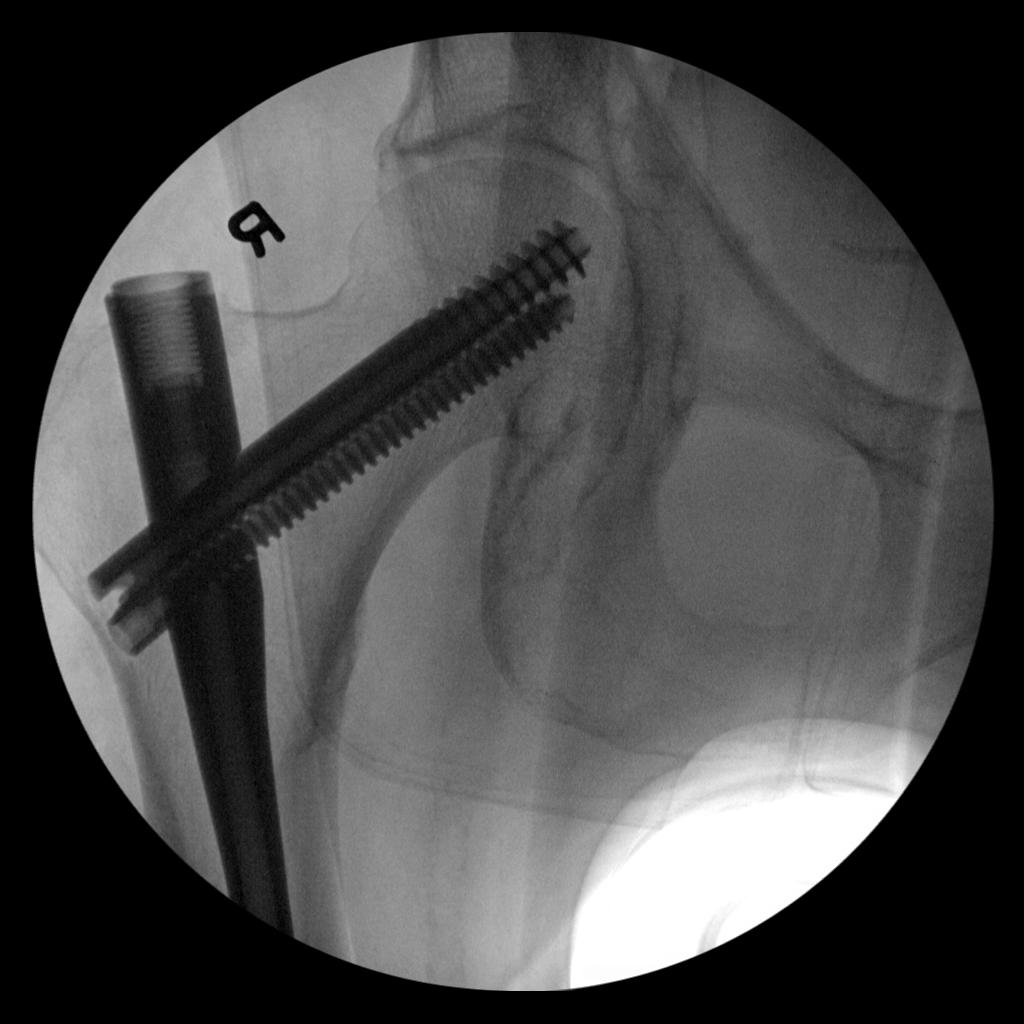
[im 2/4]
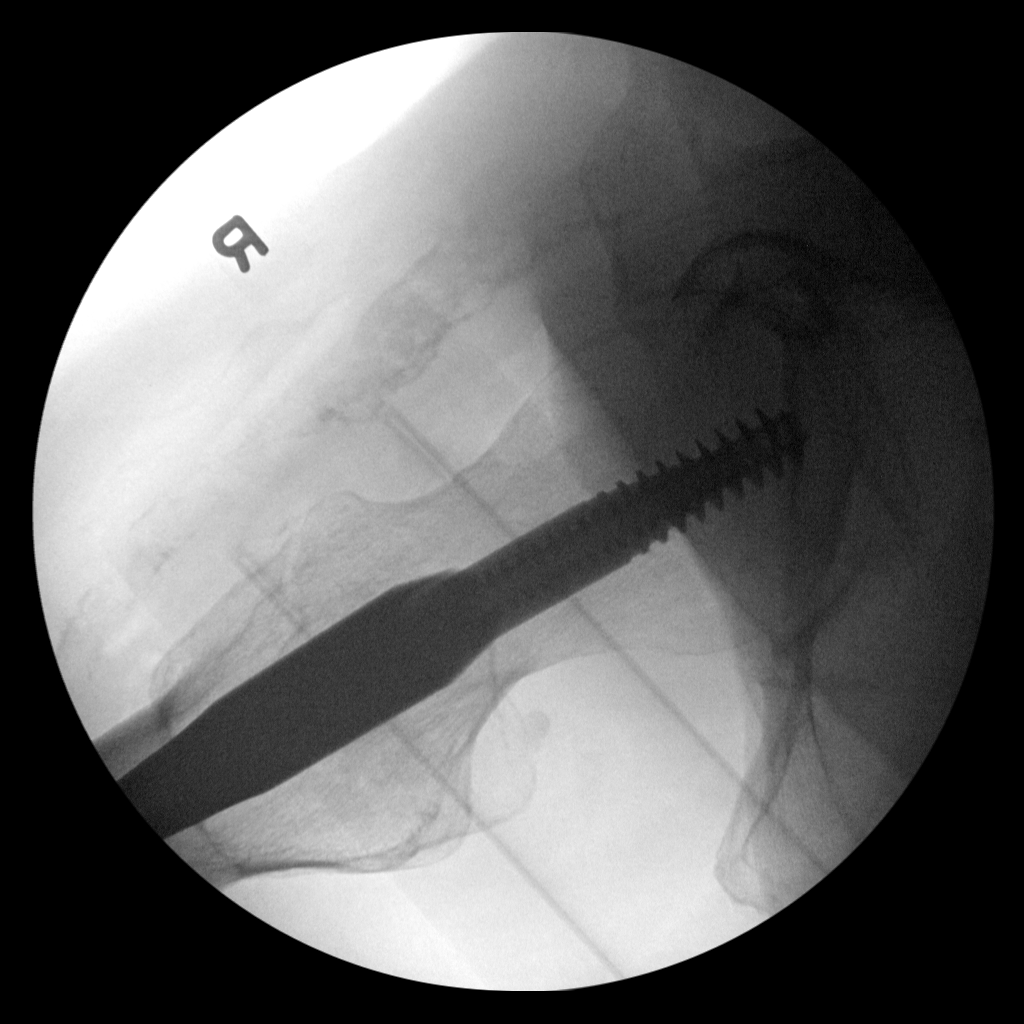
[im 3/4]
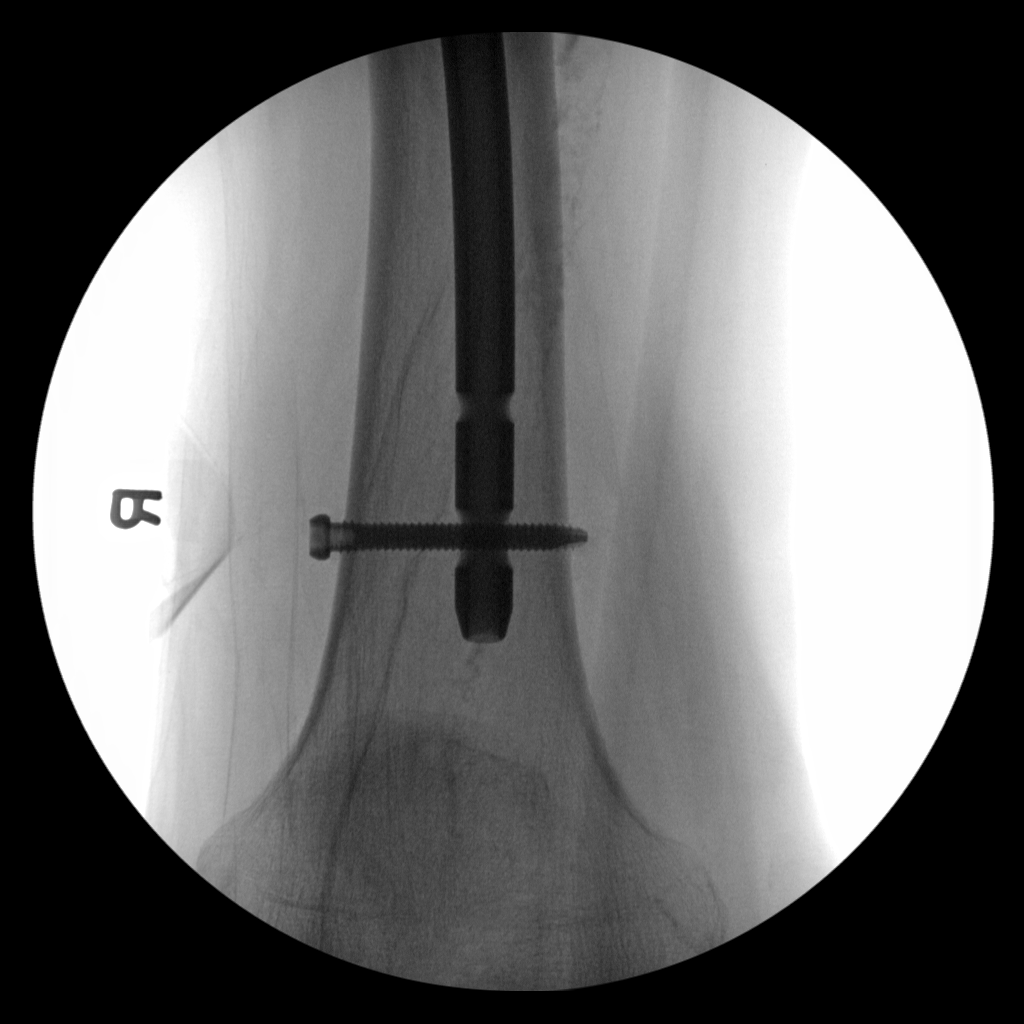
[im 4/4]
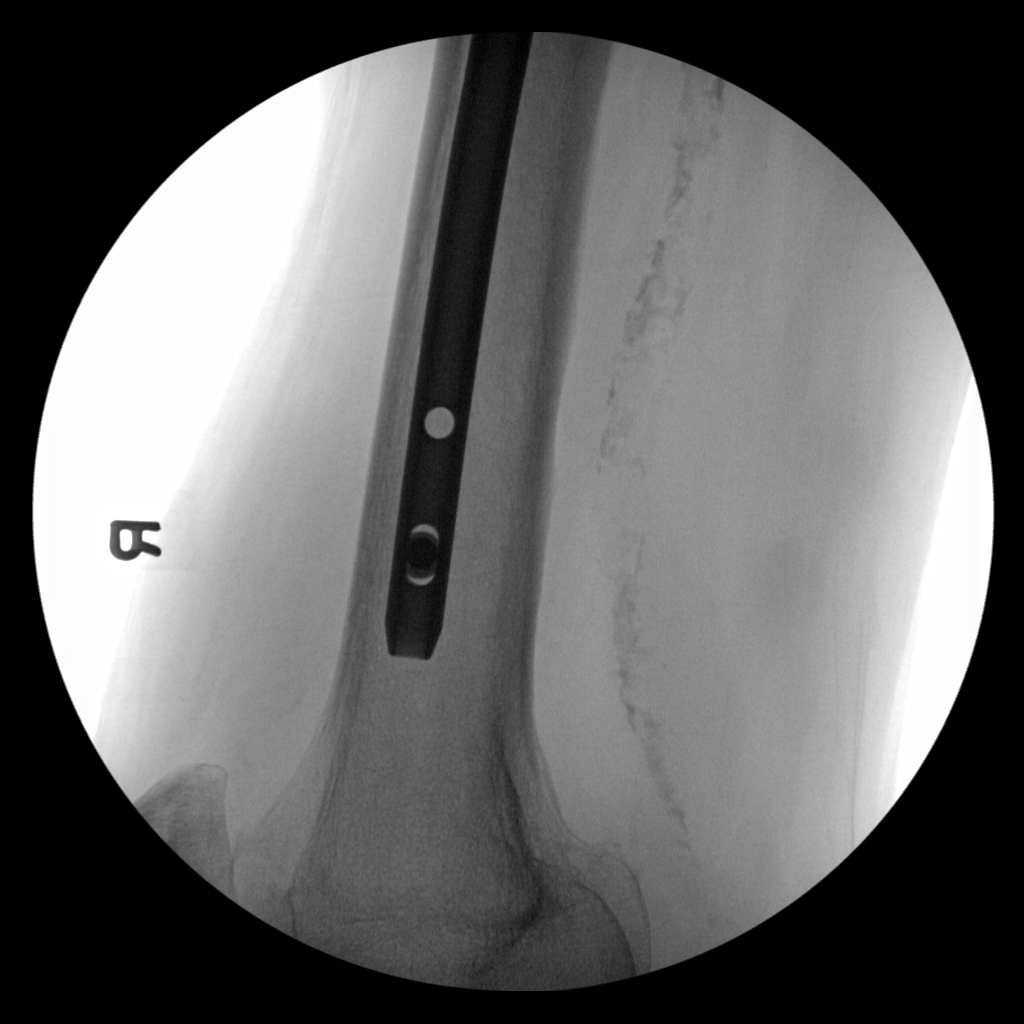

[4 of 4 positions shown; findings below may reference images not displayed]

FINDINGS: Right intertrochanteric fracture transfixed with an intramedullary
nail and interlocking femoral neck screw.

Anatomic alignment.
IMPRESSION: Interval ORIF right intertrochanteric fracture.
